# Patient Record
Sex: Male | Born: 1960
Health system: Southern US, Community
[De-identification: ages and names within clinical notes are randomized; demographics above are authoritative.]

## PROBLEM LIST (undated history)

## (undated) DIAGNOSIS — N281 Cyst of kidney, acquired: Secondary | ICD-10-CM

## (undated) DIAGNOSIS — E7800 Pure hypercholesterolemia, unspecified: Secondary | ICD-10-CM

## (undated) DIAGNOSIS — E039 Hypothyroidism, unspecified: Secondary | ICD-10-CM

## (undated) DIAGNOSIS — Z87442 Personal history of urinary calculi: Secondary | ICD-10-CM

## (undated) DIAGNOSIS — I8500 Esophageal varices without bleeding: Secondary | ICD-10-CM

## (undated) DIAGNOSIS — J302 Other seasonal allergic rhinitis: Secondary | ICD-10-CM

## (undated) DIAGNOSIS — N486 Induration penis plastica: Secondary | ICD-10-CM

## (undated) DIAGNOSIS — K5792 Diverticulitis of intestine, part unspecified, without perforation or abscess without bleeding: Secondary | ICD-10-CM

## (undated) DIAGNOSIS — Z8489 Family history of other specified conditions: Secondary | ICD-10-CM

## (undated) DIAGNOSIS — K7460 Unspecified cirrhosis of liver: Secondary | ICD-10-CM

## (undated) DIAGNOSIS — D696 Thrombocytopenia, unspecified: Secondary | ICD-10-CM

## (undated) HISTORY — DX: Diverticulitis of intestine, part unspecified, without perforation or abscess without bleeding: K57.92

## (undated) HISTORY — PX: CHOLECYSTECTOMY: SHX55

## (undated) HISTORY — DX: Personal history of urinary calculi: Z87.442

## (undated) HISTORY — DX: Other seasonal allergic rhinitis: J30.2

## (undated) HISTORY — DX: Induration penis plastica: N48.6

## (undated) HISTORY — DX: Pure hypercholesterolemia, unspecified: E78.00

## (undated) HISTORY — DX: Cyst of kidney, acquired: N28.1

## (undated) HISTORY — DX: Hypothyroidism, unspecified: E03.9

## (undated) HISTORY — DX: Thrombocytopenia, unspecified: D69.6

---

## 1979-04-29 HISTORY — PX: HERNIA REPAIR: SHX51

## 1999-04-29 HISTORY — PX: SHOULDER SURGERY: SHX246

## 2004-04-28 HISTORY — PX: COLONOSCOPY: SHX174

## 2004-11-13 ENCOUNTER — Ambulatory Visit: Payer: Self-pay | Admitting: Unknown Physician Specialty

## 2005-06-28 ENCOUNTER — Emergency Department: Payer: Self-pay | Admitting: Emergency Medicine

## 2005-12-29 ENCOUNTER — Emergency Department: Payer: Self-pay | Admitting: Internal Medicine

## 2006-01-13 ENCOUNTER — Ambulatory Visit: Payer: Self-pay | Admitting: Urology

## 2006-04-15 ENCOUNTER — Ambulatory Visit: Payer: Self-pay | Admitting: Urology

## 2007-04-16 ENCOUNTER — Ambulatory Visit: Payer: Self-pay | Admitting: Urology

## 2008-04-12 ENCOUNTER — Ambulatory Visit: Payer: Self-pay | Admitting: Urology

## 2008-10-02 ENCOUNTER — Ambulatory Visit: Payer: Self-pay | Admitting: Emergency Medicine

## 2009-04-23 ENCOUNTER — Ambulatory Visit: Payer: Self-pay | Admitting: Urology

## 2010-02-06 ENCOUNTER — Ambulatory Visit: Payer: Self-pay | Admitting: Unknown Physician Specialty

## 2010-04-17 ENCOUNTER — Ambulatory Visit: Payer: Self-pay | Admitting: Urology

## 2011-06-24 ENCOUNTER — Emergency Department: Payer: Self-pay | Admitting: *Deleted

## 2017-05-12 ENCOUNTER — Other Ambulatory Visit: Payer: Self-pay

## 2017-06-08 ENCOUNTER — Encounter: Payer: Self-pay | Admitting: Urology

## 2017-06-08 ENCOUNTER — Ambulatory Visit (INDEPENDENT_AMBULATORY_CARE_PROVIDER_SITE_OTHER): Payer: BLUE CROSS/BLUE SHIELD | Admitting: Urology

## 2017-06-08 VITALS — BP 134/80 | HR 76 | Ht 66.0 in | Wt 243.0 lb

## 2017-06-08 DIAGNOSIS — N486 Induration penis plastica: Secondary | ICD-10-CM

## 2017-06-08 NOTE — Progress Notes (Signed)
06/08/2017 2:14 PM   [name]. April 12, 1961 [id number]  Referring provider: Karen Kitchens, MD [place], [place] [address]  Chief Complaint  Patient presents with  . Abnormal Penile Curvature    New Patient    HPI: Ryan Willis is a 57 year old male seen in consultation at the request of Dr. Corinda Gubler for evaluation of Peyronie's disease.  He states approximately 3 years ago he and his wife were having vigorous intercourse and he noted penile pain however had no rapid detumescence or bruising/swelling.  He states the next day his erection was curved approximately close to degrees.  He has no pain with erections.  Over the past 3 years the curvature has increased slightly to greater than 90 degrees.  He notes the curvature at the distal shaft.  He does have some difficulty achieving and maintaining an erection however states he thinks this is more psychological.  He presently is unable to achieve penetration due to the degree of curvature.   PMH: No past medical history on file.  Surgical History: Past Surgical History:  Procedure Laterality Date  . HERNIA REPAIR  1981  . SHOULDER SURGERY  2001    Home Medications:  Allergies as of 06/08/2017   No Known Allergies     Medication List    as of 06/08/2017  2:14 PM   You have not been prescribed any medications.     Allergies: No Known Allergies  Family History: Family History  Problem Relation Age of Onset  . Kidney cancer Mother     Social History:  reports that  has never smoked. he has never used smokeless tobacco. He reports that he drinks alcohol. He reports that he does not use drugs.  ROS: UROLOGY Frequent Urination?: No Hard to postpone urination?: No Burning/pain with urination?: No Get up at night to urinate?: Yes Leakage of urine?: No Urine stream starts and stops?: No Trouble starting stream?: No Do you have to strain to urinate?: No Blood in urine?: No Urinary tract infection?:  No Sexually transmitted disease?: No Injury to kidneys or bladder?: No Painful intercourse?: No Weak stream?: No Erection problems?: Yes Penile pain?: No  Gastrointestinal Nausea?: No Vomiting?: No Indigestion/heartburn?: No Diarrhea?: No Constipation?: No  Constitutional Fever: No Night sweats?: No Weight loss?: No Fatigue?: No  Skin Skin rash/lesions?: No Itching?: No  Eyes Blurred vision?: No Double vision?: No  Ears/Nose/Throat Sore throat?: No Sinus problems?: No  Hematologic/Lymphatic Swollen glands?: No Easy bruising?: No  Cardiovascular Leg swelling?: No Chest pain?: No  Respiratory Cough?: No Shortness of breath?: No  Endocrine Excessive thirst?: No  Musculoskeletal Back pain?: No Joint pain?: No  Neurological Headaches?: No Dizziness?: No  Psychologic Depression?: No Anxiety?: No  Physical Exam: BP 134/80   Pulse 76   Ht 5' 6"  (1.676 m)   Wt 243 lb (110.2 kg)   BMI 39.22 kg/m   Constitutional:  Alert and oriented, No acute distress. HEENT: [place] AT, moist mucus membranes.  Trachea midline, no masses. Cardiovascular: No clubbing, cyanosis, or edema. Respiratory: Normal respiratory effort, no increased work of breathing. GI: Abdomen is soft, nontender, nondistended, no abdominal masses GU: No CVA tenderness.Marland Kitchen  Penis uncircumcised.  There is extensive plaque formation dorsally extending from the base to the distal shaft.  Testes descended bilaterally without masses or tenderness Skin: No rashes, bruises or suspicious lesions. Lymph: No cervical or inguinal adenopathy. Neurologic: Grossly intact, no focal deficits, moving all 4 extremities. Psychiatric: Normal mood and affect.  Assessment & Plan:    1. Peyronie disease He has curvature greater than 90 degrees and technically would not be a candidate for Xiaflex if this is confirmed on artificial erection.  Due to the degree of curvature  Xiaflex may not be as effective.  I briefly  discussed surgical options.  Recommended a second opinion with Dr. Francesca Jewett regarding suitability of Xiaflex versus [facility] surgical management.    Abbie Sons, [place] [address], [place], [place] [address] [phone]

## 2017-06-23 ENCOUNTER — Encounter: Payer: Self-pay | Admitting: *Deleted

## 2017-06-23 ENCOUNTER — Inpatient Hospital Stay: Payer: BLUE CROSS/BLUE SHIELD | Attending: Internal Medicine | Admitting: Internal Medicine

## 2017-06-23 DIAGNOSIS — N486 Induration penis plastica: Secondary | ICD-10-CM | POA: Diagnosis not present

## 2017-06-23 DIAGNOSIS — D696 Thrombocytopenia, unspecified: Secondary | ICD-10-CM | POA: Diagnosis present

## 2017-06-23 DIAGNOSIS — K7460 Unspecified cirrhosis of liver: Secondary | ICD-10-CM

## 2017-06-23 NOTE — Assessment & Plan Note (Addendum)
#  Incidental January 2019 platelets and will between 106-97.  The etiology is unclear.  Discussed the possible etiologies that include liver disease viral infection; less likely any malignancy.  The  #Recommend checking CBC LDH; hepatitis BC HIV panel [[facility]]  Ultrasound of the abdomen to rule out cirrhosis splenomegaly.  # Peyronie's disease-I think this is unrelated to his patient's thrombocytopenia.  Defer to urology.  #Follow-up in approximately 2 weeks to review the above work up.  Thank you Dr.Rabinowitz for allowing me to participate in the care of your pleasant patient. Please do not hesitate to contact me with questions or concerns in the interim.

## 2017-06-23 NOTE — Progress Notes (Signed)
[place] NOTE  Patient Care Team: Karen Kitchens, MD as PCP - General (Family Medicine)  CHIEF COMPLAINTS/PURPOSE OF CONSULTATION: Thrombocytopenia   # Thrombocytopenia  # Peyronie disease-    No history exists.     HISTORY OF PRESENTING ILLNESS:  Ryan Willis. 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  Patient states that he has been recently diagnosed with Peyronie's disease of the penis.  And he is awaiting a second opinion at [facility].  However incidentally was noted to have low platelets initially 106; and then found to have platelets of 97 after a repeat blood work.  He denies any unusual nosebleeds or gum bleeds.  He denies any easy bruising.  Patient denies any liver problems.  Denies any alcohol.  ROS: A complete 10 point review of system is done which is negative except mentioned above in history of present illness  MEDICAL HISTORY:  Past Medical History:  Diagnosis Date  . Diverticulitis   . History of kidney stones   . Hypercholesteremia   . Hypothyroidism   . Peyronie's disease   . Renal cyst, left   . Seasonal allergies   . Thrombocytopenia ([place])     SURGICAL HISTORY: Past Surgical History:  Procedure Laterality Date  . COLONOSCOPY  2006  . HERNIA REPAIR  1981  . SHOULDER SURGERY Right 2001    SOCIAL HISTORY: retd. Engineer, structural; part time information specialist; never smoked; no alcohol; live in [place]. Lives with wife.  Social History   Socioeconomic History  . Marital status: Married    Spouse name: Not on file  . Number of children: Not on file  . Years of education: Not on file  . Highest education level: Not on file  Social Needs  . Financial resource strain: Not on file  . Food insecurity - worry: Not on file  . Food insecurity - inability: Not on file  . Transportation needs - medical: Not on file  . Transportation  needs - non-medical: Not on file  Occupational History  . Not on file  Tobacco Use  . Smoking status: Never Smoker  . Smokeless tobacco: Never Used  Substance and Sexual Activity  . Alcohol use: Yes    Comment: "1 or 2 year drinks a year."  . Drug use: No  . Sexual activity: Not on file  Other Topics Concern  . Not on file  Social History Narrative  . Not on file    FAMILY HISTORY: colon father- 52 year; mother- kidney cancer; died of lung cancer [2018] Family History  Problem Relation Age of Onset  . Kidney cancer Mother   . Colon cancer Father     ALLERGIES:  has No Known Allergies.  MEDICATIONS:  No current outpatient medications on file.   No current facility-administered medications for this visit.       Marland Kitchen  PHYSICAL EXAMINATION: ECOG PERFORMANCE STATUS: 0 - Asymptomatic  Vitals:   06/23/17 1112  BP: (!) 158/93  Pulse: 73  Resp: 16  Temp: 97.9 F (36.6 C)   Filed Weights   06/23/17 1102 06/23/17 1112  Weight: 241 lb 11.8 oz (109.6 kg) 241 lb 11.8 oz (109.6 kg)    GENERAL: Well-nourished well-developed; Alert, no distress and comfortable.   Alone.  EYES: no pallor or icterus OROPHARYNX: no thrush or ulceration; good dentition  NECK: supple, no masses felt LYMPH:  no palpable lymphadenopathy  in the cervical, axillary or inguinal regions LUNGS: clear to auscultation and  No wheeze or crackles HEART/CVS: regular rate & rhythm and no murmurs; No lower extremity edema ABDOMEN: abdomen soft, non-tender and normal bowel sounds Musculoskeletal:no cyanosis of digits and no clubbing  PSYCH: alert & oriented x 3 with fluent speech NEURO: no focal motor/sensory deficits SKIN:  no rashes or significant lesions  LABORATORY DATA:  I have reviewed the data as listed No results found for: WBC, HGB, HCT, MCV, PLT No results for input(s): NA, K, CL, CO2, GLUCOSE, BUN, CREATININE, CALCIUM, GFRNONAA, GFRAA, PROT, ALBUMIN, AST, ALT, ALKPHOS, BILITOT, BILIDIR, IBILI in  the last 8760 hours.  RADIOGRAPHIC STUDIES: I have personally reviewed the radiological images as listed and agreed with the findings in the report. No results found.  ASSESSMENT & PLAN:   Thrombocytopenia [facility]) #Incidental January 2019 platelets and will between 106-97.  The etiology is unclear.  Discussed the possible etiologies that include liver disease viral infection; less likely any malignancy.  The  #Recommend checking CBC LDH; hepatitis BC HIV panel [[facility]]  Ultrasound of the abdomen to rule out cirrhosis splenomegaly.  # Peyronie's disease-I think this is unrelated to his patient's thrombocytopenia.  Defer to urology.  #Follow-up in approximately 2 weeks to review the above work up.  Thank you Dr.Rabinowitz for allowing me to participate in the care of your pleasant patient. Please do not hesitate to contact me with questions or concerns in the interim.   # 45 minutes face-to-face with the patient discussing the above plan of care; more than 50% of time spent on prognosis/ natural history; counseling and coordination.  All questions were answered. The patient knows to call the clinic with any problems, questions or concerns.    Cammie Sickle, MD 06/24/2017 2:36 PM

## 2017-06-26 ENCOUNTER — Ambulatory Visit
Admission: RE | Admit: 2017-06-26 | Discharge: 2017-06-26 | Disposition: A | Discharge status: Home / Self Care | Payer: BLUE CROSS/BLUE SHIELD | Source: Ambulatory Visit | Attending: Internal Medicine | Admitting: Internal Medicine

## 2017-06-26 DIAGNOSIS — K8020 Calculus of gallbladder without cholecystitis without obstruction: Secondary | ICD-10-CM | POA: Diagnosis not present

## 2017-06-26 DIAGNOSIS — R161 Splenomegaly, not elsewhere classified: Secondary | ICD-10-CM | POA: Insufficient documentation

## 2017-06-26 DIAGNOSIS — K7460 Unspecified cirrhosis of liver: Secondary | ICD-10-CM | POA: Insufficient documentation

## 2017-06-26 DIAGNOSIS — D696 Thrombocytopenia, unspecified: Secondary | ICD-10-CM | POA: Diagnosis present

## 2017-07-07 ENCOUNTER — Inpatient Hospital Stay: Payer: BLUE CROSS/BLUE SHIELD | Attending: Internal Medicine | Admitting: Internal Medicine

## 2017-07-07 ENCOUNTER — Encounter: Payer: Self-pay | Admitting: Internal Medicine

## 2017-07-07 ENCOUNTER — Other Ambulatory Visit: Payer: Self-pay

## 2017-07-07 VITALS — BP 153/97 | HR 106 | Temp 97.9°F | Resp 20 | Ht 66.0 in | Wt 240.3 lb

## 2017-07-07 DIAGNOSIS — D696 Thrombocytopenia, unspecified: Secondary | ICD-10-CM | POA: Diagnosis not present

## 2017-07-07 DIAGNOSIS — R161 Splenomegaly, not elsewhere classified: Secondary | ICD-10-CM | POA: Diagnosis not present

## 2017-07-07 DIAGNOSIS — N486 Induration penis plastica: Secondary | ICD-10-CM | POA: Diagnosis not present

## 2017-07-07 NOTE — Progress Notes (Signed)
[place] NOTE  Patient Care Team: Karen Kitchens, MD as PCP - General (Family Medicine)  CHIEF COMPLAINTS/PURPOSE OF CONSULTATION: Thrombocytopenia  # THROMBOCYTOPENIA [97-108; incidental]; Hep B/hepC/HIV-NEG [[facility]]  # SPLENOMEGALY; no cirrhosis [US- 1000cc; march 2019]  # Peyronie disease-    No history exists.     HISTORY OF PRESENTING ILLNESS:  Ryan Willis. 57 y.o.  male with a new diagnosis of incidental thrombocytopenia is here for follow-up/review the labs ultrasound of the abdomen.  Patient continues to deny any nosebleeds or gum bleeds.  Denies any easy bruising.  Continues to deny any liver problems denies any alcohol abuse.  Patient states that he has been recently diagnosed with Peyronie's disease of the penis.  And he is awaiting a second opinion at [facility]: A complete 10 point review of system is done which is negative except mentioned above in history of present illness  MEDICAL HISTORY:  Past Medical History:  Diagnosis Date  . Diverticulitis   . History of kidney stones   . Hypercholesteremia   . Hypothyroidism   . Peyronie's disease   . Renal cyst, left   . Seasonal allergies   . Thrombocytopenia ([place])     SURGICAL HISTORY: Past Surgical History:  Procedure Laterality Date  . COLONOSCOPY  2006  . HERNIA REPAIR  1981  . SHOULDER SURGERY Right 2001    SOCIAL HISTORY: retd. 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 Lives with wife.  Social History   Socioeconomic History  . Marital status: Married    Spouse name: Not on file  . Number of children: Not on file  . Years of education: Not on file  . Highest education level: Not on file  Social Needs  . Financial resource strain: Not on file  . Food insecurity - worry: Not on file  . Food insecurity - inability: Not on file  . Transportation needs - medical: Not on file  . Transportation needs - non-medical:  Not on file  Occupational History  . Not on file  Tobacco Use  . Smoking status: Never Smoker  . Smokeless tobacco: Never Used  Substance and Sexual Activity  . Alcohol use: Yes    Comment: "1 or 2 year drinks a year."  . Drug use: No  . Sexual activity: Not on file  Other Topics Concern  . Not on file  Social History Narrative  . Not on file    FAMILY HISTORY: colon father- 2 year; mother- kidney cancer; died of lung cancer [2018] Family History  Problem Relation Age of Onset  . Kidney cancer Mother   . Colon cancer Father     ALLERGIES:  has No Known Allergies.  MEDICATIONS:  No current outpatient medications on file.   No current facility-administered medications for this visit.       Marland Kitchen  PHYSICAL EXAMINATION: ECOG PERFORMANCE STATUS: 0 - Asymptomatic  Vitals:   07/07/17 1003  BP: (!) 153/97  Pulse: (!) 106  Resp: 20  Temp: 97.9 F (36.6 C)   Filed Weights   07/07/17 1003  Weight: 240 lb 4.8 oz (109 kg)    GENERAL: Well-nourished well-developed; Alert, no distress and comfortable.   Alone.  EYES: no pallor or icterus OROPHARYNX: no thrush or ulceration; good dentition  NECK: supple, no masses felt LYMPH:  no palpable lymphadenopathy in the cervical, axillary or inguinal regions LUNGS: clear to auscultation and  No wheeze or crackles  HEART/CVS: regular rate & rhythm and no murmurs; No lower extremity edema ABDOMEN: abdomen soft, non-tender and normal bowel sounds; spleen 4-5 fingers breadth below the costal margin Musculoskeletal:no cyanosis of digits and no clubbing  PSYCH: alert & oriented x 3 with fluent speech NEURO: no focal motor/sensory deficits SKIN:  no rashes or significant lesions  LABORATORY DATA:  I have reviewed the data as listed No results found for: WBC, HGB, HCT, MCV, PLT No results for input(s): NA, K, CL, CO2, GLUCOSE, BUN, CREATININE, CALCIUM, GFRNONAA, GFRAA, PROT, ALBUMIN, AST, ALT, ALKPHOS, BILITOT, BILIDIR, IBILI in the  last 8760 hours.  RADIOGRAPHIC STUDIES: I have personally reviewed the radiological images as listed and agreed with the findings in the report. US Abdomen Complete  Result Date: 06/26/2017 CLINICAL DATA:  Thrombocytopenia, cirrhosis EXAM: COMPLETE ABDOMINAL ULTRASOUND COMPARISON:  04/12/2008 FINDINGS: Gallbladder: Incompletely distended, wall measuring up to 4.3 mm thick. At least one 7 mm shadowing calculus. Sonographer describes no sonographic Murphy's sign. There is no pericholecystic fluid. Common bile duct:  Normal in caliber, 4.9m diameter. Liver: No focal lesion or intrahepatic biliary ductal dilatation. Antegrade portal vein flow noted on color Doppler. IVC: Visualized segments unremarkable, portions obscured by overlying bowel gas. Pancreas: Visualized segments unremarkable, portions obscured by overlying bowel gas. Spleen: Enlarged, 19 x 8 x 17 cm (volume = 1000 cm^3) Right Kidney:  No mass or hydronephrosis, 12cm in length. Left Kidney: No hydronephrosis. 6.1 x 5.7 x 5.5 cm simple appearing cyst from the lower pole. No solid renal lesion. 11.4cm in length. Abdominal aorta: Visualized segments unremarkable, portions obscured by overlying bowel gas. IMPRESSION: 1. Unremarkable liver. 2. Splenomegaly 3. Cholelithiasis with mild gallbladder wall thickening which is nonspecific. Electronically Signed   By: DLucrezia EuropeM.D.   On: 06/26/2017 09:11   FINDINGS: Gallbladder: Incompletely distended, wall measuring up to 4.3 mm thick. At least one 7 mm shadowing calculus. Sonographer describes no sonographic Murphy's sign. There is no pericholecystic fluid.  Common bile duct:  Normal in caliber, 4.35mdiameter.  Liver: No focal lesion or intrahepatic biliary ductal dilatation. Antegrade portal vein flow noted on color Doppler.  IVC: Visualized segments unremarkable, portions obscured by overlying bowel gas.  Pancreas: Visualized segments unremarkable, portions obscured by overlying bowel  gas.  Spleen: Enlarged, 19 x 8 x 17 cm (volume = 1000 cm^3)  Right Kidney:  No mass or hydronephrosis, 12cm in length.  Left Kidney: No hydronephrosis. 6.1 x 5.7 x 5.5 cm simple appearing cyst from the lower pole. No solid renal lesion. 11.4cm in length.  Abdominal aorta: Visualized segments unremarkable, portions obscured by overlying bowel gas.  IMPRESSION: 1. Unremarkable liver. 2. Splenomegaly 3. Cholelithiasis with mild gallbladder wall thickening which is nonspecific.   Electronically Signed   By: D Lucrezia Europe.D.   On: 06/26/2017 09:11  ASSESSMENT & PLAN:   [facility] Incidental January 2019 platelets and will between 106-97; with splenomegaly.  Infectious workup including-HIV hepatitis B and C is negative.  Interestingly patient has then splenomegaly [see discussion below]  #Splenomegaly [1000 cc]-unclear etiology.  However this is the likely cause of patient's thrombocytopenia.  No obvious evidence of cirrhosis.  Discussed possibility of lymphoma/ involving the spleen is quite possible.  Recommend a CT scan of the abdomen pelvis for further evaluation.  Also discussed regarding bone marrow biopsy.  However as patient is asymptomatic at this time otherwise-if the CT scan does not show any lymphadenopathy or any suspicion for lymphoma I think it is reasonable to  monitor the patient closely-and initiate further workup if patient gets symptomatic/or platelets drop.  Patient agrees with the plan.  # Peyronie's disease-I think this is unrelated to his patient's thrombocytopenia.   # CT A/P- ASAP; follow up in 2 months' labs - [facility]. Will call with results.   All questions were answered. The patient knows to call the clinic with any problems, questions or concerns.    Cammie Sickle, MD 07/08/2017 7:20 AM

## 2017-07-07 NOTE — Assessment & Plan Note (Addendum)
#  Incidental January 2019 platelets and will between 106-97; with splenomegaly.  Infectious workup including-HIV hepatitis B and C is negative.  Interestingly patient has then splenomegaly [see discussion below]  #Splenomegaly [1000 cc]-unclear etiology.  However this is the likely cause of patient's thrombocytopenia.  No obvious evidence of cirrhosis.  Discussed possibility of lymphoma/ involving the spleen is quite possible.  Recommend a CT scan of the abdomen pelvis for further evaluation.  Also discussed regarding bone marrow biopsy.  However as patient is asymptomatic at this time otherwise-if the CT scan does not show any lymphadenopathy or any suspicion for lymphoma I think it is reasonable to monitor the patient closely-and initiate further workup if patient gets symptomatic/or platelets drop.  Patient agrees with the plan.  # Peyronie's disease-I think this is unrelated to his patient's thrombocytopenia.   # CT A/P- ASAP; follow up in 2 months' labs - [facility]. Will call with results.

## 2017-07-09 ENCOUNTER — Ambulatory Visit
Admission: RE | Admit: 2017-07-09 | Payer: BLUE CROSS/BLUE SHIELD | Source: Ambulatory Visit | Admitting: Internal Medicine

## 2017-07-13 ENCOUNTER — Other Ambulatory Visit: Payer: Self-pay | Admitting: *Deleted

## 2017-07-13 DIAGNOSIS — D696 Thrombocytopenia, unspecified: Secondary | ICD-10-CM

## 2017-07-13 DIAGNOSIS — R161 Splenomegaly, not elsewhere classified: Secondary | ICD-10-CM

## 2017-07-14 ENCOUNTER — Other Ambulatory Visit: Payer: Self-pay | Admitting: Internal Medicine

## 2017-07-15 ENCOUNTER — Ambulatory Visit
Admission: RE | Admit: 2017-07-15 | Discharge: 2017-07-15 | Disposition: A | Discharge status: Home / Self Care | Payer: BLUE CROSS/BLUE SHIELD | Source: Ambulatory Visit | Attending: Internal Medicine | Admitting: Internal Medicine

## 2017-07-15 DIAGNOSIS — R161 Splenomegaly, not elsewhere classified: Secondary | ICD-10-CM | POA: Diagnosis not present

## 2017-07-15 DIAGNOSIS — K449 Diaphragmatic hernia without obstruction or gangrene: Secondary | ICD-10-CM | POA: Insufficient documentation

## 2017-07-15 DIAGNOSIS — I8500 Esophageal varices without bleeding: Secondary | ICD-10-CM | POA: Insufficient documentation

## 2017-07-15 DIAGNOSIS — K8020 Calculus of gallbladder without cholecystitis without obstruction: Secondary | ICD-10-CM | POA: Diagnosis not present

## 2017-07-15 DIAGNOSIS — D696 Thrombocytopenia, unspecified: Secondary | ICD-10-CM | POA: Diagnosis present

## 2017-07-15 DIAGNOSIS — N200 Calculus of kidney: Secondary | ICD-10-CM | POA: Insufficient documentation

## 2017-07-15 DIAGNOSIS — K5730 Diverticulosis of large intestine without perforation or abscess without bleeding: Secondary | ICD-10-CM | POA: Diagnosis not present

## 2017-07-15 MED ORDER — IOPAMIDOL (ISOVUE-300) INJECTION 61%
100.0000 mL | Freq: Once | INTRAVENOUS | Status: AC | PRN
  Administered 2017-07-15: 100 mL via INTRAVENOUS

## 2017-07-20 ENCOUNTER — Telehealth: Payer: Self-pay | Admitting: *Deleted

## 2017-07-20 DIAGNOSIS — K7460 Unspecified cirrhosis of liver: Secondary | ICD-10-CM

## 2017-07-20 NOTE — Telephone Encounter (Signed)
Spoke with patient. Patient informed of test results. He is agreeable for GI consult.

## 2017-08-12 ENCOUNTER — Other Ambulatory Visit: Payer: Self-pay | Admitting: Internal Medicine

## 2017-08-18 ENCOUNTER — Ambulatory Visit: Payer: BLUE CROSS/BLUE SHIELD | Admitting: Gastroenterology

## 2017-08-18 ENCOUNTER — Encounter: Payer: Self-pay | Admitting: Gastroenterology

## 2017-08-18 ENCOUNTER — Encounter (INDEPENDENT_AMBULATORY_CARE_PROVIDER_SITE_OTHER): Payer: Self-pay

## 2017-08-18 VITALS — BP 120/83 | HR 88 | Ht 66.0 in | Wt 240.2 lb

## 2017-08-18 DIAGNOSIS — K7460 Unspecified cirrhosis of liver: Secondary | ICD-10-CM | POA: Diagnosis not present

## 2017-08-18 DIAGNOSIS — Z800 Family history of malignant neoplasm of digestive organs: Secondary | ICD-10-CM | POA: Diagnosis not present

## 2017-08-18 DIAGNOSIS — Z114 Encounter for screening for human immunodeficiency virus [HIV]: Secondary | ICD-10-CM | POA: Diagnosis not present

## 2017-08-18 NOTE — Patient Instructions (Signed)
1. Complete lab orders.  2. Pick-up prep for colonoscopy.

## 2017-08-18 NOTE — Addendum Note (Signed)
Addended by: Peggye Ley on: 08/18/2017 02:32 PM   Modules accepted: Orders

## 2017-08-18 NOTE — Progress Notes (Signed)
Ryan Bellows MD, MRCP(U.K) [address]  [place]  [place], [place] [address]  Main: [phone]  Fax: [phone]   Gastroenterology Consultation  Referring Provider:     Cammie Willis, * Primary Care Physician:  Ryan Kitchens, MD Primary Gastroenterologist:  Dr. Jonathon Willis  Reason for Consultation:     Liver cirrhosis         HPI:   Ryan Willis. is a 57 y.o. y/o male referred for consultation & management  by Dr. Corinda Willis, Ryan Molder, MD.    He has been referred for liver cirrhosis. He was seen recently by Dr Ryan Willis for thrombocytopenia .   RUQ USG in 06/2017 showed a normal liver, splenomegaly and Cholelithiasis with mild gallbladder wall thickening.    The patient had a CT scan on 07/15/17 which showed features of cirrhosis and portal hypertension with with splenomegaly, a recanalized left paraumbilical vein and small distal esophageal varices.   I am unable to view the labs in 06/2017 . Said the liver cirrhosis is a new diagnosis . No issues with the liver the past or family.   His dad had colon cancer that went into the liver. He last colonoscopy was atleast 10 years back .    No military service, no tatoos, no incarceration . 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 No illegal drugs in the past .   He is not diabetic, he is not hypertensive. This is the heaviest he has every weighed. Obesity runs in the family. Does wake up in the morning with a headache, hesnores . Feels sleepy during the day .     Past Medical History:  Diagnosis Date  . Diverticulitis   . History of kidney stones   . Hypercholesteremia   . Hypothyroidism   . Peyronie's disease   . Renal cyst, left   . Seasonal allergies   . Thrombocytopenia ([place])     Past Surgical History:  Procedure Laterality Date  . COLONOSCOPY  2006  . HERNIA REPAIR  1981  . SHOULDER SURGERY Right 2001    Prior to Admission medications   Not on File    Family  History  Problem Relation Age of Onset  . Kidney cancer Mother   . Colon cancer Father      Social History   Tobacco Use  . Smoking status: Never Smoker  . Smokeless tobacco: Never Used  Substance Use Topics  . Alcohol use: Not Currently    Comment: "1 or 2 year drinks a year."  . Drug use: No    Allergies as of 08/18/2017  . (No Known Allergies)    Review of Systems:    All systems reviewed and negative except where noted in HPI.   Physical Exam:  BP 120/83   Pulse 88   Ht 5\' 6"  (1.676 m)   Wt 240 lb 3.2 oz (109 kg)   BMI 38.77 kg/m  No LMP for male patient. Psych:  Alert and cooperative. Normal mood and affect. General:   Alert,  Well-developed, well-nourished, pleasant and cooperative in NAD Head:  Normocephalic and atraumatic. Eyes:  Sclera clear, no icterus.   Conjunctiva pink. Ears:  Normal auditory acuity. Nose:  No deformity, discharge, or lesions. Mouth:  No deformity or lesions,oropharynx pink & moist. Neck:  Supple; no masses or thyromegaly. Lungs:  Respirations even and unlabored.  Clear throughout to auscultation.   No wheezes, crackles, or rhonchi.  No acute distress. Heart:  Regular rate and rhythm; no murmurs, clicks, rubs, or gallops. Abdomen:  Normal bowel sounds.  No bruits.  Soft, non-tender and non-distended without masses, hepatosplenomegaly, central midline epigastric hernia reducible.  No guarding or rebound tenderness.    Neurologic:  Alert and oriented x3;  grossly normal neurologically. Skin:  Intact without significant lesions or rashes. No jaundice.Spider angiomas over chest and back  Lymph Nodes:  No significant cervical adenopathy. Psych:  Alert and cooperative. Normal mood and affect.  Imaging Studies: No results found.  Assessment and Plan:   Ryan Willis. is a 57 y.o. y/o male has been referred for liver cirrhosis. The CT scan shows features of portal hypertension with recannalization of the umbilical vein , esophageal varices and  splenomegaly. I suspect most likely that the thrombocytopenia is related to portal hypertension .   Plan  1. Further labs necessary to look for viral hepatitis, autoimmune liver disease, Primary Biliary cirrhosis, celiac disease, muscle disorders,Primary Sclerosing Cholangitis, Hemachromatosis, Wilson's disease, or A-1 antitrypsin deficiency.This will help determine the etiology .   2. EGD to screen for esophageal varcies and grade   3. Abdominal ultrasound to screen for [place]:  due  12/2017   4. Check INR, labs for MELD and childs PUGH score.   5. Colonoscopy due to family history of colon cancer  6. Patient information on NAFLD    I have discussed alternative options, risks & benefits,  which include, but are not limited to, bleeding, infection, perforation,respiratory complication & drug reaction.  The patient agrees with this plan & written consent will be obtained.    Follow up in 3 months   Dr Ryan Bellows MD,MRCP(U.K)

## 2017-08-19 MED ORDER — PEG 3350-KCL-NA BICARB-NACL 420 G PO SOLR: 4000.0000 mL | Freq: Once | ORAL | 0 refills | Status: AC

## 2017-08-19 NOTE — Addendum Note (Signed)
Addended by: Peggye Ley on: 08/19/2017 10:13 AM   Modules accepted: Orders, SmartSet

## 2017-08-19 NOTE — Addendum Note (Signed)
Addended by: Peggye Ley on: 08/19/2017 10:12 AM   Modules accepted: Orders

## 2017-08-25 LAB — PROTIME-INR
INR: 1.2 (ref 0.8–1.2)
Prothrombin Time: 12.1 s — ABNORMAL HIGH (ref 9.1–12.0)

## 2017-08-25 LAB — COMPREHENSIVE METABOLIC PANEL
A/G RATIO: 1.5 (ref 1.2–2.2)
ALT: 36 IU/L (ref 0–44)
AST: 49 IU/L — ABNORMAL HIGH (ref 0–40)
Albumin: 4.2 g/dL (ref 3.5–5.5)
Alkaline Phosphatase: 99 IU/L (ref 39–117)
BUN/Creatinine Ratio: 19 (ref 9–20)
BUN: 16 mg/dL (ref 6–24)
Bilirubin Total: 0.7 mg/dL (ref 0.0–1.2)
CALCIUM: 9.2 mg/dL (ref 8.7–10.2)
CO2: 22 mmol/L (ref 20–29)
CREATININE: 0.86 mg/dL (ref 0.76–1.27)
Chloride: 107 mmol/L — ABNORMAL HIGH (ref 96–106)
GFR calc non Af Amer: 96 mL/min/{1.73_m2} (ref >59)
GFR, EST AFRICAN AMERICAN: 111 mL/min/{1.73_m2} (ref >59)
Globulin, Total: 2.8 g/dL (ref 1.5–4.5)
Glucose: 85 mg/dL (ref 65–99)
Potassium: 4.5 mmol/L (ref 3.5–5.2)
Sodium: 141 mmol/L (ref 134–144)
TOTAL PROTEIN: 7 g/dL (ref 6.0–8.5)

## 2017-08-25 LAB — HEPATITIS B SURFACE ANTIBODY,QUALITATIVE: Hep B Surface Ab, Qual: REACTIVE

## 2017-08-25 LAB — ANA: Anti Nuclear Antibody(ANA): NEGATIVE

## 2017-08-26 ENCOUNTER — Other Ambulatory Visit: Payer: Self-pay

## 2017-08-26 DIAGNOSIS — Z800 Family history of malignant neoplasm of digestive organs: Secondary | ICD-10-CM

## 2017-08-26 DIAGNOSIS — K7460 Unspecified cirrhosis of liver: Secondary | ICD-10-CM

## 2017-08-26 DIAGNOSIS — Z1211 Encounter for screening for malignant neoplasm of colon: Secondary | ICD-10-CM

## 2017-08-30 LAB — GLIA (IGA/G) + TTG IGA
Antigliadin Abs, IgA: 6 units (ref 0–19)
GLIADIN IGG: 6 U (ref 0–19)

## 2017-08-30 LAB — ANTI-SMOOTH MUSCLE ANTIBODY, IGG: SMOOTH MUSCLE AB: 14 U (ref 0–19)

## 2017-08-30 LAB — CBC WITH DIFFERENTIAL/PLATELET
BASOS: 1 %
Basophils Absolute: 0 10*3/uL (ref 0.0–0.2)
EOS (ABSOLUTE): 0.1 10*3/uL (ref 0.0–0.4)
Eos: 3 %
Hematocrit: 42.6 % (ref 37.5–51.0)
Hemoglobin: 15.2 g/dL (ref 13.0–17.7)
IMMATURE GRANS (ABS): 0 10*3/uL (ref 0.0–0.1)
IMMATURE GRANULOCYTES: 0 %
LYMPHS: 29 %
Lymphocytes Absolute: 1.2 10*3/uL (ref 0.7–3.1)
MCH: 35.6 pg — ABNORMAL HIGH (ref 26.6–33.0)
MCHC: 35.7 g/dL (ref 31.5–35.7)
MCV: 100 fL — AB (ref 79–97)
Monocytes Absolute: 0.4 10*3/uL (ref 0.1–0.9)
Monocytes: 10 %
NEUTROS ABS: 2.3 10*3/uL (ref 1.4–7.0)
Neutrophils: 57 %
Platelets: 95 10*3/uL — CL (ref 150–379)
RBC: 4.27 x10E6/uL (ref 4.14–5.80)
RDW: 13 % (ref 12.3–15.4)
WBC: 4.1 10*3/uL (ref 3.4–10.8)

## 2017-08-30 LAB — IRON AND TIBC
Iron Saturation: 19 % (ref 15–55)
Iron: 70 ug/dL (ref 38–169)
TIBC: 366 ug/dL (ref 250–450)
UIBC: 296 ug/dL (ref 111–343)

## 2017-08-30 LAB — ANTI-MICROSOMAL ANTIBODY LIVER / KIDNEY: LKM1 AB: 2.5 U (ref 0.0–20.0)

## 2017-08-30 LAB — ALPHA-1-ANTITRYPSIN: A1 ANTITRYPSIN: 101 mg/dL (ref 90–200)

## 2017-08-30 LAB — IMMUNOGLOBULINS A/E/G/M, SERUM
IgA/Immunoglobulin A, Serum: 256 mg/dL (ref 90–386)
IgE (Immunoglobulin E), Serum: 11 IU/mL (ref 6–495)
IgG (Immunoglobin G), Serum: 1289 mg/dL (ref 700–1600)
IgM (Immunoglobulin M), Srm: 160 mg/dL (ref 20–172)

## 2017-08-30 LAB — HEPATITIS A ANTIBODY, TOTAL: Hep A Total Ab: POSITIVE — AB

## 2017-08-30 LAB — HEPATITIS B E ANTIBODY: Hep B E Ab: NEGATIVE

## 2017-08-30 LAB — HIV ANTIBODY (ROUTINE TESTING W REFLEX): HIV SCREEN 4TH GENERATION: NONREACTIVE

## 2017-08-30 LAB — CERULOPLASMIN: Ceruloplasmin: 15.2 mg/dL — ABNORMAL LOW (ref 16.0–31.0)

## 2017-08-30 LAB — HEPATITIS C ANTIBODY

## 2017-08-30 LAB — HEPATITIS B CORE ANTIBODY, TOTAL: Hep B Core Total Ab: NEGATIVE

## 2017-08-30 LAB — HEPATITIS B SURFACE ANTIGEN: HEP B S AG: NEGATIVE

## 2017-08-30 LAB — FERRITIN: Ferritin: 188 ng/mL (ref 30–400)

## 2017-09-01 ENCOUNTER — Telehealth: Payer: Self-pay

## 2017-09-01 ENCOUNTER — Ambulatory Visit: Payer: BLUE CROSS/BLUE SHIELD | Admitting: Internal Medicine

## 2017-09-01 DIAGNOSIS — K7460 Unspecified cirrhosis of liver: Secondary | ICD-10-CM

## 2017-09-01 NOTE — Telephone Encounter (Signed)
Advised [name] of results per Dr. Vicente Males.  Ceruloplasmin levels are low.   1. Needs slit lamp exam performed by ophthalmologist to r/o " KF ring "   2. 24 hour urinary copper excretion test   [name] states he will contact the ophthamolologist concerning the testing for KF ring along with regular eye visit.   He will pick-up the lab order for copper test. Having labs completed at the same time as Dr. Aletha Halim.

## 2017-09-08 ENCOUNTER — Encounter: Payer: Self-pay | Admitting: *Deleted

## 2017-09-08 ENCOUNTER — Ambulatory Visit: Payer: BLUE CROSS/BLUE SHIELD | Admitting: Certified Registered"

## 2017-09-08 ENCOUNTER — Ambulatory Visit: Payer: BLUE CROSS/BLUE SHIELD | Admitting: Internal Medicine

## 2017-09-08 ENCOUNTER — Ambulatory Visit
Admission: RE | Admit: 2017-09-08 | Discharge: 2017-09-08 | Disposition: A | Discharge status: Home / Self Care | Payer: BLUE CROSS/BLUE SHIELD | Source: Ambulatory Visit | Attending: Gastroenterology | Admitting: Gastroenterology

## 2017-09-08 ENCOUNTER — Encounter
Admission: RE | Disposition: A | Discharge status: Home / Self Care | Payer: Self-pay | Source: Ambulatory Visit | Attending: Gastroenterology

## 2017-09-08 DIAGNOSIS — E039 Hypothyroidism, unspecified: Secondary | ICD-10-CM | POA: Insufficient documentation

## 2017-09-08 DIAGNOSIS — Z800 Family history of malignant neoplasm of digestive organs: Secondary | ICD-10-CM | POA: Insufficient documentation

## 2017-09-08 DIAGNOSIS — Z79899 Other long term (current) drug therapy: Secondary | ICD-10-CM | POA: Diagnosis not present

## 2017-09-08 DIAGNOSIS — Z87442 Personal history of urinary calculi: Secondary | ICD-10-CM | POA: Diagnosis not present

## 2017-09-08 DIAGNOSIS — E7800 Pure hypercholesterolemia, unspecified: Secondary | ICD-10-CM | POA: Insufficient documentation

## 2017-09-08 DIAGNOSIS — K2970 Gastritis, unspecified, without bleeding: Secondary | ICD-10-CM | POA: Insufficient documentation

## 2017-09-08 DIAGNOSIS — K319 Disease of stomach and duodenum, unspecified: Secondary | ICD-10-CM | POA: Diagnosis not present

## 2017-09-08 DIAGNOSIS — K7460 Unspecified cirrhosis of liver: Secondary | ICD-10-CM | POA: Diagnosis not present

## 2017-09-08 DIAGNOSIS — Z1211 Encounter for screening for malignant neoplasm of colon: Secondary | ICD-10-CM | POA: Diagnosis not present

## 2017-09-08 DIAGNOSIS — N486 Induration penis plastica: Secondary | ICD-10-CM | POA: Insufficient documentation

## 2017-09-08 HISTORY — PX: COLONOSCOPY WITH PROPOFOL: SHX5780

## 2017-09-08 HISTORY — PX: ESOPHAGOGASTRODUODENOSCOPY (EGD) WITH PROPOFOL: SHX5813

## 2017-09-08 SURGERY — ESOPHAGOGASTRODUODENOSCOPY (EGD) WITH PROPOFOL
Anesthesia: General

## 2017-09-08 MED ORDER — SODIUM CHLORIDE 0.9 % IV SOLN
INTRAVENOUS | Status: DC
  Administered 2017-09-08: 1000 mL via INTRAVENOUS

## 2017-09-08 MED ORDER — PROPOFOL 500 MG/50ML IV EMUL
INTRAVENOUS | Status: AC
  Filled 2017-09-08: qty 50

## 2017-09-08 MED ORDER — MIDAZOLAM HCL 2 MG/2ML IJ SOLN
INTRAMUSCULAR | Status: AC
  Filled 2017-09-08: qty 2

## 2017-09-08 MED ORDER — PROPOFOL 500 MG/50ML IV EMUL
INTRAVENOUS | Status: DC | PRN
  Administered 2017-09-08: 130 ug/kg/min via INTRAVENOUS

## 2017-09-08 MED ORDER — LIDOCAINE HCL (CARDIAC) PF 100 MG/5ML IV SOSY
PREFILLED_SYRINGE | INTRAVENOUS | Status: DC | PRN
  Administered 2017-09-08: 100 mg via INTRAVENOUS

## 2017-09-08 MED ORDER — LIDOCAINE HCL (PF) 2 % IJ SOLN
INTRAMUSCULAR | Status: AC
  Filled 2017-09-08: qty 10

## 2017-09-08 MED ORDER — MIDAZOLAM HCL 2 MG/2ML IJ SOLN
INTRAMUSCULAR | Status: DC | PRN
  Administered 2017-09-08: 2 mg via INTRAVENOUS

## 2017-09-08 MED ORDER — PROPOFOL 10 MG/ML IV BOLUS
INTRAVENOUS | Status: DC | PRN
  Administered 2017-09-08: 10 mg via INTRAVENOUS
  Administered 2017-09-08: 90 mg via INTRAVENOUS

## 2017-09-08 NOTE — Transfer of Care (Signed)
Immediate Anesthesia Transfer of Care Note  Patient: Ryan Willis.  Procedure(s) Performed: ESOPHAGOGASTRODUODENOSCOPY (EGD) WITH PROPOFOL (N/A ) COLONOSCOPY WITH PROPOFOL (N/A )  Patient Location: PACU  Anesthesia Type:General  Level of Consciousness: sedated  Airway & Oxygen Therapy: Patient Spontanous Breathing and Patient connected to nasal cannula oxygen  Post-op Assessment: Report given to RN and Post -op Vital signs reviewed and stable  Post vital signs: Reviewed and stable  Last Vitals:  Vitals Value Taken Time  BP 100/51 09/08/2017 10:58 AM  Temp    Pulse 77 09/08/2017 10:58 AM  Resp 14 09/08/2017 10:58 AM  SpO2 94 % 09/08/2017 10:58 AM    Last Pain:  Vitals:   09/08/17 1013  TempSrc: Tympanic  PainSc: 0-No pain         Complications: No apparent anesthesia complications

## 2017-09-08 NOTE — H&P (Addendum)
[name], MD [address] [place], [place], [place], [id number], [place], [place], [place], [address] Phone: [phone]  Fax: [phone]  Primary Care Physician:  Karen Kitchens, MD   Pre-Procedure History & Physical: HPI:  Wes Lezotte. is a 57 y.o. male is here for an endoscopy and colonoscopy    Past Medical History:  Diagnosis Date  . Diverticulitis   . History of kidney stones   . Hypercholesteremia   . Hypothyroidism   . Peyronie's disease   . Renal cyst, left   . Seasonal allergies   . Thrombocytopenia ([place])     Past Surgical History:  Procedure Laterality Date  . COLONOSCOPY  2006  . HERNIA REPAIR  1981  . SHOULDER SURGERY Right 2001    Prior to Admission medications   Not on File    Allergies as of 08/26/2017  . (No Known Allergies)    Family History  Problem Relation Age of Onset  . Kidney cancer Mother   . Colon cancer Father     Social History   Socioeconomic History  . Marital status: Married    Spouse name: Not on file  . Number of children: Not on file  . Years of education: Not on file  . Highest education level: Not on file  Occupational History  . Not on file  Social Needs  . Financial resource strain: Not on file  . Food insecurity:    Worry: Not on file    Inability: Not on file  . Transportation needs:    Medical: Not on file    Non-medical: Not on file  Tobacco Use  . Smoking status: Never Smoker  . Smokeless tobacco: Never Used  Substance and Sexual Activity  . Alcohol use: Not Currently    Comment: "1 or 2 year drinks a year."  . Drug use: No  . Sexual activity: Not on file  Lifestyle  . Physical activity:    Days per week: Not on file    Minutes per session: Not on file  . Stress: Not on file  Relationships  . Social connections:    Talks on phone: Not on file    Gets together: Not on file    Attends religious service: Not on file    Active member of club or organization: Not  on file    Attends meetings of clubs or organizations: Not on file    Relationship status: Not on file  . Intimate partner violence:    Fear of current or ex partner: Not on file    Emotionally abused: Not on file    Physically abused: Not on file    Forced sexual activity: Not on file  Other Topics Concern  . Not on file  Social History Narrative  . Not on file    Review of Systems: See HPI, otherwise negative ROS  Physical Exam: BP 124/81   Pulse 68   Temp (!) 96.5 F (35.8 C) (Tympanic)   Resp 16   Ht 5\' 6"  (1.676 m)   Wt 240 lb (108.9 kg)   SpO2 94%   BMI 38.74 kg/m  General:   Alert,  pleasant and cooperative in NAD Head:  Normocephalic and atraumatic. Neck:  Supple; no masses or thyromegaly. Lungs:  Clear throughout to auscultation, normal respiratory effort.    Heart:  +S1, +S2, Regular rate and rhythm, No edema. Abdomen:  Soft, nontender and nondistended. Normal bowel sounds, without guarding,  and without rebound.   Neurologic:  Alert and  oriented x4;  grossly normal neurologically.  Impression/Plan: [name]. is here for an endoscopy and colonoscopy  to be performed for  evaluation of esophageal varices and surveillance due to family  history of colon cancer.     Risks, benefits, limitations, and alternatives regarding endoscopy have been reviewed with the patient.  Questions have been answered.  All parties agreeable.   [name], MD  09/08/2017, 10:21 AM

## 2017-09-08 NOTE — Anesthesia Preprocedure Evaluation (Signed)
Anesthesia Evaluation  Patient identified by MRN, date of birth, ID band Patient awake    Reviewed: Allergy & Precautions, H&P , NPO status , Patient's Chart, lab work & pertinent test results, reviewed documented beta blocker date and time   History of Anesthesia Complications Negative for: history of anesthetic complications  Airway Mallampati: III  TM Distance: >3 FB Neck ROM: full    Dental  (+) Caps, Dental Advidsory Given, Teeth Intact   Pulmonary neg pulmonary ROS,           Cardiovascular Exercise Tolerance: Good negative cardio ROS       Neuro/Psych negative neurological ROS  negative psych ROS   GI/Hepatic negative GI ROS, NAFLD   Endo/Other  negative endocrine ROS  Renal/GU Renal disease (left kidney cyst)  negative genitourinary   Musculoskeletal   Abdominal   Peds  Hematology negative hematology ROS (+)   Anesthesia Other Findings Past Medical History: No date: Diverticulitis No date: History of kidney stones No date: Hypercholesteremia No date: Hypothyroidism No date: Peyronie's disease No date: Renal cyst, left No date: Seasonal allergies No date: Thrombocytopenia (H[facility])   Reproductive/Obstetrics negative OB ROS                             Anesthesia Physical Anesthesia Plan  ASA: II  Anesthesia Plan: General   Post-op Pain Management:    Induction: Intravenous  PONV Risk Score and Plan: 2 and Propofol infusion  Airway Management Planned: Nasal Cannula  Additional Equipment:   Intra-op Plan:   Post-operative Plan:   Informed Consent: I have reviewed the patients History and Physical, chart, labs and discussed the procedure including the risks, benefits and alternatives for the proposed anesthesia with the patient or authorized representative who has indicated his/her understanding and acceptance.   Dental Advisory Given  Plan Discussed with:  Anesthesiologist, CRNA and Surgeon  Anesthesia Plan Comments:         Anesthesia Quick Evaluation

## 2017-09-08 NOTE — Anesthesia Procedure Notes (Signed)
Performed by: Chenelle Benning, CRNA Pre-anesthesia Checklist: Patient identified, Emergency Drugs available, Suction available, Patient being monitored and Timeout performed Patient Re-evaluated:Patient Re-evaluated prior to induction Oxygen Delivery Method: Nasal cannula Induction Type: IV induction       

## 2017-09-08 NOTE — Op Note (Signed)
[facility] Gastroenterology Patient Name: Ryan Willis Procedure Date: 09/08/2017 10:30 AM MRN: [id number] Account #: [id number] Date of Birth: 22-Oct-1960 Admit Type: Outpatient Age: 57 Room: [facility] ENDO ROOM 1 Gender: Male Note Status: Finalized Procedure:            Upper GI endoscopy Indications:          Cirrhosis rule out esophageal varices Providers:            [name] MD, MD Referring MD:         Amada Jupiter. Carollee Herter (Referring MD) Medicines:            Monitored Anesthesia Care Complications:        No immediate complications. Procedure:            Pre-Anesthesia Assessment:                       - Prior to the procedure, a History and Physical was                        performed, and patient medications, allergies and                        sensitivities were reviewed. The patient's tolerance of                        previous anesthesia was reviewed.                       - The risks and benefits of the procedure and the                        sedation options and risks were discussed with the                        patient. All questions were answered and informed                        consent was obtained.                       - ASA Grade Assessment: III - A patient with severe                        systemic disease.                       After obtaining informed consent, the endoscope was                        passed under direct vision. Throughout the procedure,                        the patient's blood pressure, pulse, and oxygen                        saturations were monitored continuously. The Endoscope                        was introduced through the mouth, and advanced to the  third part of duodenum. The upper GI endoscopy was                        accomplished with ease. The patient tolerated the                        procedure well. Findings:      The examined esophagus was normal.      The examined  duodenum was normal.      Localized mild inflammation characterized by congestion (edema) and       erythema was found in the gastric antrum. Biopsies were taken with a       cold forceps for histology.      The cardia and gastric fundus were normal on retroflexion. Impression:           - Normal esophagus.                       - Normal examined duodenum.                       - Gastritis. Biopsied. Recommendation:       - Await pathology results.                       - Perform a colonoscopy today.                       - Repeat upper endoscopy in 3 years for surveillance. Procedure Code(s):    --- Professional ---                       [phone], Esophagogastroduodenoscopy, flexible, transoral;                        with biopsy, single or multiple Diagnosis Code(s):    --- Professional ---                       K29.70, Gastritis, unspecified, without bleeding                       K74.60, Unspecified cirrhosis of liver CPT copyright 2017 American Medical Association. All rights reserved. The codes documented in this report are preliminary and upon coder review may  be revised to meet current compliance requirements. [name], MD [name] MD, MD 09/08/2017 10:44:01 AM This report has been signed electronically. Number of Addenda: 0 Note Initiated On: 09/08/2017 10:30 AM      [facility]

## 2017-09-08 NOTE — Anesthesia Postprocedure Evaluation (Signed)
Anesthesia Post Note  Patient: Ryan Willis.  Procedure(s) Performed: ESOPHAGOGASTRODUODENOSCOPY (EGD) WITH PROPOFOL (N/A ) COLONOSCOPY WITH PROPOFOL (N/A )  Patient location during evaluation: Endoscopy Anesthesia Type: General Level of consciousness: awake and alert Pain management: pain level controlled Vital Signs Assessment: post-procedure vital signs reviewed and stable Respiratory status: spontaneous breathing, nonlabored ventilation, respiratory function stable and patient connected to nasal cannula oxygen Cardiovascular status: blood pressure returned to baseline and stable Postop Assessment: no apparent nausea or vomiting Anesthetic complications: no     Last Vitals:  Vitals:   09/08/17 1013 09/08/17 1058  BP: 124/81 (!) 100/51  Pulse: 68 77  Resp: 16 14  Temp: (!) 35.8 C   SpO2: 94% 94%    Last Pain:  Vitals:   09/08/17 1013  TempSrc: Tympanic  PainSc: 0-No pain                 Martha Clan

## 2017-09-08 NOTE — Anesthesia Post-op Follow-up Note (Signed)
Anesthesia QCDR form completed.        

## 2017-09-08 NOTE — Op Note (Signed)
[facility] Gastroenterology Patient Name: Ryan Willis Procedure Date: 09/08/2017 10:28 AM MRN: [id number] Account #: [id number] Date of Birth: 1960-09-30 Admit Type: Outpatient Age: 58 Room: [facility] ENDO ROOM 1 Gender: Male Note Status: Finalized Procedure:            Colonoscopy Indications:          Screening in patient at increased risk: Family history                        of 1st-degree relative with colorectal cancer Providers:            [name] MD, MD Referring MD:         Amada Jupiter. Carollee Herter (Referring MD) Medicines:            Monitored Anesthesia Care Complications:        No immediate complications. Procedure:            Pre-Anesthesia Assessment:                       - Prior to the procedure, a History and Physical was                        performed, and patient medications, allergies and                        sensitivities were reviewed. The patient's tolerance of                        previous anesthesia was reviewed.                       - The risks and benefits of the procedure and the                        sedation options and risks were discussed with the                        patient. All questions were answered and informed                        consent was obtained.                       - ASA Grade Assessment: III - A patient with severe                        systemic disease.                       After obtaining informed consent, the colonoscope was                        passed under direct vision. Throughout the procedure,                        the patient's blood pressure, pulse, and oxygen                        saturations were monitored continuously. The  Colonoscope was introduced through the anus and                        advanced to the the cecum, identified by the                        appendiceal orifice, IC valve and transillumination.                        The colonoscopy was performed  with ease. The patient                        tolerated the procedure well. The quality of the bowel                        preparation was poor. Findings:      The perianal and digital rectal examinations were normal.      A moderate amount of semi-liquid stool was found in the entire colon,       making visualization difficult.      The exam was otherwise without abnormality. Impression:           - Preparation of the colon was poor.                       - Stool in the entire examined colon.                       - The examination was otherwise normal.                       - No specimens collected. Recommendation:       - Discharge patient to home (with escort).                       - Resume previous diet.                       - Continue present medications.                       - Repeat colonoscopy in 3 months because the bowel                        preparation was suboptimal. Procedure Code(s):    --- Professional ---                       [phone], Colonoscopy, flexible; diagnostic, including                        collection of specimen(s) by brushing or washing, when                        performed (separate procedure) Diagnosis Code(s):    --- Professional ---                       Z80.0, Family history of malignant neoplasm of                        digestive organs CPT copyright 2017 American Medical Association. All rights reserved. The codes documented in this  report are preliminary and upon coder review may  be revised to meet current compliance requirements. [name], MD [name] MD, MD 09/08/2017 10:54:35 AM This report has been signed electronically. Number of Addenda: 0 Note Initiated On: 09/08/2017 10:28 AM Scope Withdrawal Time: 0 hours 4 minutes 30 seconds  Total Procedure Duration: 0 hours 7 minutes 24 seconds       [facility]

## 2017-09-09 ENCOUNTER — Encounter: Payer: Self-pay | Admitting: Gastroenterology

## 2017-09-09 LAB — SURGICAL PATHOLOGY

## 2017-09-14 ENCOUNTER — Other Ambulatory Visit: Payer: Self-pay | Admitting: Gastroenterology

## 2017-09-15 ENCOUNTER — Encounter: Admission: RE | Payer: Self-pay | Source: Ambulatory Visit

## 2017-09-15 ENCOUNTER — Inpatient Hospital Stay: Payer: BLUE CROSS/BLUE SHIELD | Attending: Internal Medicine | Admitting: Internal Medicine

## 2017-09-15 ENCOUNTER — Ambulatory Visit
Admission: RE | Admit: 2017-09-15 | Payer: BLUE CROSS/BLUE SHIELD | Source: Ambulatory Visit | Admitting: Gastroenterology

## 2017-09-15 ENCOUNTER — Encounter: Payer: Self-pay | Admitting: Internal Medicine

## 2017-09-15 VITALS — BP 137/81 | HR 80 | Temp 97.0°F | Resp 16 | Wt 227.7 lb

## 2017-09-15 DIAGNOSIS — R161 Splenomegaly, not elsewhere classified: Secondary | ICD-10-CM | POA: Diagnosis not present

## 2017-09-15 DIAGNOSIS — N486 Induration penis plastica: Secondary | ICD-10-CM | POA: Diagnosis not present

## 2017-09-15 DIAGNOSIS — D696 Thrombocytopenia, unspecified: Secondary | ICD-10-CM

## 2017-09-15 DIAGNOSIS — K7460 Unspecified cirrhosis of liver: Secondary | ICD-10-CM

## 2017-09-15 SURGERY — ESOPHAGOGASTRODUODENOSCOPY (EGD) WITH PROPOFOL
Anesthesia: General

## 2017-09-15 NOTE — Progress Notes (Signed)
[facility] NOTE  Patient Care Team: Karen Kitchens, MD as PCP - General (Family Medicine)  CHIEF COMPLAINTS/PURPOSE OF CONSULTATION: Thrombocytopenia  # THROMBOCYTOPENIA [97-108; incidental]; Hep B/hepC/HIV-NEG [[facility]] sec to cirrhosis/ splenomegaly  # Cirrhosis/ SPLENOMEGALY[US- 1000cc; CT A/P march 2019] s/p EGD/colo [may 2019- Dr.Anna]  # Peyronie disease-    No history exists.     HISTORY OF PRESENTING ILLNESS:  Ryan Willis. 57 y.o.  male newly diagnosed cirrhosis/splenomegaly and thrombocytopenia is here for follow-up.  In the interim patient had EGD colonoscopy-studies are fairly unremarkable.  He needs to have repeat colonoscopy because of poor colon prep.  Patient denies any blood in stools black or stools.  Denies any swelling in the legs.   Patient states that he is been trying to lose weight.  He has lost some weight.  Review of Systems  Constitutional: Positive for weight loss. Negative for chills, diaphoresis, fever and malaise/fatigue.  HENT: Negative for nosebleeds and sore throat.   Eyes: Negative for double vision.  Respiratory: Negative for cough, hemoptysis, sputum production, shortness of breath and wheezing.   Cardiovascular: Negative for chest pain, palpitations, orthopnea and leg swelling.  Gastrointestinal: Negative for abdominal pain, blood in stool, constipation, diarrhea, heartburn, melena, nausea and vomiting.  Genitourinary: Negative for dysuria, frequency and urgency.  Musculoskeletal: Negative for back pain and joint pain.  Skin: Negative.  Negative for itching and rash.  Neurological: Negative for dizziness, tingling, focal weakness, weakness and headaches.  Endo/Heme/Allergies: Does not bruise/bleed easily.  Psychiatric/Behavioral: Negative for depression. The patient is not nervous/anxious and does not have insomnia.     MEDICAL HISTORY:  Past Medical History:  Diagnosis Date  . Diverticulitis   . History  of kidney stones   . Hypercholesteremia   . Hypothyroidism   . Peyronie's disease   . Renal cyst, left   . Seasonal allergies   . Thrombocytopenia ([place])     SURGICAL HISTORY: Past Surgical History:  Procedure Laterality Date  . COLONOSCOPY  2006  . COLONOSCOPY WITH PROPOFOL N/A 09/08/2017   Procedure: COLONOSCOPY WITH PROPOFOL;  Surgeon: [name], MD;  Location[facility] ENDOSCOPY;  Service: Gastroenterology;  Laterality: N/A;  . ESOPHAGOGASTRODUODENOSCOPY (EGD) WITH PROPOFOL N/A 09/08/2017   Procedure: ESOPHAGOGASTRODUODENOSCOPY (EGD) WITH PROPOFOL;  Surgeon: [name], MD;  Location: [facility] ENDOSCOPY;  Service: Gastroenterology;  Laterality: N/A;  . HERNIA REPAIR  1981  . SHOULDER SURGERY Right 2001    SOCIAL HISTORY:  Social History   Socioeconomic History  . Marital status: Married    Spouse name: Not on file  . Number of children: Not on file  . Years of education: Not on file  . Highest education level: Not on file  Occupational History  . Not on file  Social Needs  . Financial resource strain: Not on file  . Food insecurity:    Worry: Not on file    Inability: Not on file  . Transportation needs:    Medical: Not on file    Non-medical: Not on file  Tobacco Use  . Smoking status: Never Smoker  . Smokeless tobacco: Never Used  Substance and Sexual Activity  . Alcohol use: Not Currently    Comment: "1 or 2 year drinks a year."  . Drug use: No  . Sexual activity: Not on file  Lifestyle  . Physical activity:    Days per week: Not on file    Minutes per session: Not on file  . Stress: Not on file  Relationships  . Social connections:    Talks on phone: Not on file    Gets together: Not on file    Attends religious service: Not on file    Active member of club or organization: Not on file    Attends meetings of clubs or organizations: Not on file    Relationship status: Not on file  . Intimate partner violence:    Fear of current or ex partner: Not on file     Emotionally abused: Not on file    Physically abused: Not on file    Forced sexual activity: Not on file  Other Topics Concern  . Not on file  Social History Narrative   retd. Engineer, structural; part time information specialist; never smoked; no alcohol; live in [place]. Lives with wife.     FAMILY HISTORY: colon father- 83 year; mother- kidney cancer; died of lung cancer [2018] Family History  Problem Relation Age of Onset  . Kidney cancer Mother   . Colon cancer Father     ALLERGIES:  has No Known Allergies.  MEDICATIONS:  No current outpatient medications on file.   No current facility-administered medications for this visit.       Marland Kitchen  PHYSICAL EXAMINATION: ECOG PERFORMANCE STATUS: 0 - Asymptomatic  Vitals:   09/15/17 0952 09/15/17 0957  BP:  137/81  Pulse:  80  Resp: 16 16  Temp:  (!) 97 F (36.1 C)   Filed Weights   09/15/17 0952  Weight: 227 lb 11.8 oz (103.3 kg)   GENERAL: Well-nourished well-developed; Alert, no distress and comfortable.  He is alone. EYES: no pallor or icterus OROPHARYNX: no thrush or ulceration; NECK: supple; no lymph nodes felt. LYMPH:  no palpable lymphadenopathy in the axillary or inguinal regions LUNGS: Decreased breath sounds auscultation bilaterally. No wheeze or crackles HEART/CVS: regular rate & rhythm and no murmurs; No lower extremity edema ABDOMEN:abdomen soft, non-tender and normal bowel sounds.  Positive for splenomegaly.  Positive for hepatomegaly. Musculoskeletal:no cyanosis of digits and no clubbing  PSYCH: alert & oriented x 3 with fluent speech NEURO: no focal motor/sensory deficits SKIN:  no rashes or significant lesions   LABORATORY DATA:  I have reviewed the data as listed Lab Results  Component Value Date   WBC 4.1 08/24/2017   HGB 15.2 08/24/2017   HCT 42.6 08/24/2017   MCV 100 (H) 08/24/2017   PLT 95 (LL) 08/24/2017   Recent Labs    08/24/17 0923  NA 141  K 4.5  CL 107*  CO2 22  GLUCOSE 85  BUN  16  CREATININE 0.86  CALCIUM 9.2  GFRNONAA 96  GFRAA 111  PROT 7.0  ALBUMIN 4.2  AST 49*  ALT 36  ALKPHOS 99  BILITOT 0.7    RADIOGRAPHIC STUDIES: I have personally reviewed the radiological images as listed and agreed with the findings in the report. No results found.  IMPRESSION: 1. Findings are consistent with hepatic cirrhosis and portal hypertension with splenomegaly, a recanalized left paraumbilical vein and small distal esophageal varices. 2. Nonobstructing bilateral renal calculi. No evidence of ureteral calculus or hydronephrosis. 3. Cholelithiasis. 4. Enlarging hiatal hernia containing fat and a small amount of fluid. 5. Distal colonic diverticulosis.   Electronically Signed   By: Richardean Sale M.D.   On: 07/15/2017 15:53   ASSESSMENT & PLAN:   Thrombocytopenia ([place]) # Thrombocytopenia- January 2019 platelets and will between 106-97; with splenomegaly.  Today platelets are 98.  Fairly asymptomatic continue to monitor.  #  Cirrhosis/splenomegaly-compensated; defer to further management to GI.  Discussed my concerns for development of HCC-although risk is small; would recommend AFP/ultrasound every 6 months.  Agree with lifestyle modification.  # Peyronie's disease stable; defer to urology for further work-up/management.  # follow up in 6 months/labs- AFP   All questions were answered. The patient knows to call the clinic with any problems, questions or concerns.    Cammie Sickle, MD 09/15/2017 1:48 PM

## 2017-09-15 NOTE — Assessment & Plan Note (Addendum)
#   Thrombocytopenia- January 2019 platelets and will between 106-97; with splenomegaly.  Today platelets are 98.  Fairly asymptomatic continue to monitor.  #Cirrhosis/splenomegaly-compensated; defer to further management to GI.  Discussed my concerns for development of HCC-although risk is small; would recommend AFP/ultrasound every 6 months.  Agree with lifestyle modification.  # Peyronie's disease stable; defer to urology for further work-up/management.  # follow up in 6 months/labs- AFP

## 2017-09-16 LAB — COPPER, URINE - RANDOM OR 24 HOUR
Copper / Creatinine Ratio: 16 ug/g creat (ref 0–49)
Copper, 24H Ur: 26 ug/24 hr (ref 3–35)
Copper, Ur: 10 ug/L
Creatinine(Crt),U: 0.63 g/L (ref 0.30–3.00)

## 2017-09-25 ENCOUNTER — Encounter: Payer: Self-pay | Admitting: Internal Medicine

## 2017-10-01 ENCOUNTER — Encounter: Payer: Self-pay | Admitting: Gastroenterology

## 2017-11-17 ENCOUNTER — Other Ambulatory Visit: Payer: Self-pay

## 2017-11-17 ENCOUNTER — Ambulatory Visit: Payer: BLUE CROSS/BLUE SHIELD | Admitting: Gastroenterology

## 2017-11-17 ENCOUNTER — Encounter: Payer: Self-pay | Admitting: Gastroenterology

## 2017-11-17 VITALS — BP 129/84 | HR 62 | Resp 16 | Ht 66.0 in | Wt 215.0 lb

## 2017-11-17 DIAGNOSIS — Z1211 Encounter for screening for malignant neoplasm of colon: Secondary | ICD-10-CM

## 2017-11-17 DIAGNOSIS — K7460 Unspecified cirrhosis of liver: Secondary | ICD-10-CM | POA: Diagnosis not present

## 2017-11-17 DIAGNOSIS — Z800 Family history of malignant neoplasm of digestive organs: Secondary | ICD-10-CM

## 2017-11-17 NOTE — Progress Notes (Signed)
[name] MD, MRCP(U.K) [address]  [place]  [place], [place] [address]  Main: [phone]  Fax: [phone].   Primary Care Physician: Karen Kitchens, MD  Primary Gastroenterologist:  Dr. [name]   Chief Complaint  Patient presents with  . Follow-up  . Cirrhosis    HPI: Ryan Willis. is a 57 y.o. male    Summary of history :  He was last seen on 08/18/17 for liver cirrhosis. RUQ USG in 06/2017 showed a normal liver, splenomegaly and Cholelithiasis with mild gallbladder wall thickening.  The patient had a CT scan on 07/15/17 which showed features of cirrhosis and portal hypertension with with splenomegaly, a recanalized left paraumbilical vein and small distal esophageal varices.His dad had colon cancer that went into the liver. He last colonoscopy was atleast 10 years back .   Interval history   08/18/2017-  11/17/2017  09/08/17- EGD: No varices antral gastropathy on bx. colonoscopy poor prep   Labs 09/14/17 : Normal 24 hour urinary copper 08/24/17 : Hep B surface ab- positive,  ANA ,SM ab,LKM, immunoglubulins ,celiac serology,A1AT,HIV,Hep C ab,Hep B cv ab , Hep B e ab,HbsAg,  , -  Normal/Negative. Hb 15 Plt 95, ferritin 188. Immune to Hep A.INR 1.2 ,Cr 0.86 Ceruloplasmin low 15.2 .    Lost weight intentionally - 32 lbs . Changed the way he eats.    No current outpatient medications on file.   No current facility-administered medications for this visit.     Allergies as of 11/17/2017  . (No Known Allergies)    ROS:  General: Negative for anorexia, weight loss, fever, chills, fatigue, weakness. ENT: Negative for hoarseness, difficulty swallowing , nasal congestion. CV: Negative for chest pain, angina, palpitations, dyspnea on exertion, peripheral edema.  Respiratory: Negative for dyspnea at rest, dyspnea on exertion, cough, sputum, wheezing.  GI: See history of present illness. GU:  Negative for dysuria, hematuria, urinary incontinence, urinary  frequency, nocturnal urination.  Endo: Negative for unusual weight change.    Physical Examination:   BP 129/84 (BP Location: Left Arm, Patient Position: Sitting, Cuff Size: Large)   Pulse 62   Resp 16   Ht 5\' 6"  (1.676 m)   Wt 215 lb (97.5 kg)   BMI 34.70 kg/m   General: Well-nourished, well-developed in no acute distress.  Eyes: No icterus. Conjunctivae pink. Mouth: Oropharyngeal mucosa moist and pink , no lesions erythema or exudate. Lungs: Clear to auscultation bilaterally. Non-labored. Heart: Regular rate and rhythm, no murmurs rubs or gallops.  Abdomen: Bowel sounds are normal, nontender, nondistended, no hepatosplenomegaly or masses, no abdominal bruits or hernia , no rebound or guarding.   Extremities: No lower extremity edema. No clubbing or deformities. Neuro: Alert and oriented x 3.  Grossly intact. Skin: Warm and dry, no jaundice.   Psych: Alert and cooperative, normal mood and affect.   Imaging Studies: No results found.  Assessment and Plan:   Ryan Willis. is a 57 y.o. y/o male here to follow up for for liver cirrhosis. The CT scan shows features of portal hypertension with recannalization of the umbilical vein , esophageal varices and splenomegaly. I suspect most likely that the thrombocytopenia is related to portal hypertension . Childs A. Etiology of liver disease likely NAFLD. Doing well and losing weight    Plan  1. Low ceruloplasmin :Slit lamp exam to r/o KF rings performed - will get report from his doctor  2. EGD to screen for esophageal varices in 08/2020  3. Abdominal ultrasound to screen for HCC:  due  12/2017   4. Colonoscopy due to family history of colon cancer needs to be repeated due to poor prep.  5. Patient information on NAFLD   6. Vaccination including Flu, pneumococcal, Tdap with Karen Kitchens, MD    I have discussed alternative options, risks & benefits,  which include, but are not limited to, bleeding, infection,  perforation,respiratory complication & drug reaction.  The patient agrees with this plan & written consent will be obtained.     Dr [name]  MD,MRCP [facility]) Follow up in 12 weeks

## 2018-01-01 ENCOUNTER — Encounter
Admission: RE | Disposition: A | Discharge status: Home / Self Care | Payer: Self-pay | Source: Ambulatory Visit | Attending: Gastroenterology

## 2018-01-01 ENCOUNTER — Ambulatory Visit: Payer: BLUE CROSS/BLUE SHIELD | Admitting: Certified Registered Nurse Anesthetist

## 2018-01-01 ENCOUNTER — Encounter: Payer: Self-pay | Admitting: Certified Registered Nurse Anesthetist

## 2018-01-01 ENCOUNTER — Ambulatory Visit
Admission: RE | Admit: 2018-01-01 | Discharge: 2018-01-01 | Disposition: A | Discharge status: Home / Self Care | Payer: BLUE CROSS/BLUE SHIELD | Source: Ambulatory Visit | Attending: Gastroenterology | Admitting: Gastroenterology

## 2018-01-01 DIAGNOSIS — K5730 Diverticulosis of large intestine without perforation or abscess without bleeding: Secondary | ICD-10-CM | POA: Insufficient documentation

## 2018-01-01 DIAGNOSIS — Z1211 Encounter for screening for malignant neoplasm of colon: Secondary | ICD-10-CM | POA: Diagnosis not present

## 2018-01-01 DIAGNOSIS — D122 Benign neoplasm of ascending colon: Secondary | ICD-10-CM | POA: Diagnosis not present

## 2018-01-01 DIAGNOSIS — Z800 Family history of malignant neoplasm of digestive organs: Secondary | ICD-10-CM

## 2018-01-01 DIAGNOSIS — D696 Thrombocytopenia, unspecified: Secondary | ICD-10-CM | POA: Diagnosis not present

## 2018-01-01 DIAGNOSIS — G4730 Sleep apnea, unspecified: Secondary | ICD-10-CM | POA: Insufficient documentation

## 2018-01-01 DIAGNOSIS — K640 First degree hemorrhoids: Secondary | ICD-10-CM | POA: Insufficient documentation

## 2018-01-01 HISTORY — PX: COLONOSCOPY WITH PROPOFOL: SHX5780

## 2018-01-01 SURGERY — COLONOSCOPY WITH PROPOFOL
Anesthesia: General

## 2018-01-01 MED ORDER — LIDOCAINE HCL (PF) 1 % IJ SOLN: 2.0000 mL | Freq: Once | INTRAMUSCULAR | Status: DC

## 2018-01-01 MED ORDER — PROPOFOL 10 MG/ML IV BOLUS
INTRAVENOUS | Status: DC | PRN
  Administered 2018-01-01: 70 mg via INTRAVENOUS

## 2018-01-01 MED ORDER — SODIUM CHLORIDE 0.9 % IV SOLN
INTRAVENOUS | Status: DC
  Administered 2018-01-01: 08:00:00 via INTRAVENOUS

## 2018-01-01 MED ORDER — PROPOFOL 500 MG/50ML IV EMUL
INTRAVENOUS | Status: AC
  Filled 2018-01-01: qty 50

## 2018-01-01 MED ORDER — LIDOCAINE HCL (PF) 2 % IJ SOLN
INTRAMUSCULAR | Status: AC
  Filled 2018-01-01: qty 10

## 2018-01-01 MED ORDER — PROPOFOL 500 MG/50ML IV EMUL
INTRAVENOUS | Status: DC | PRN
  Administered 2018-01-01: 175 ug/kg/min via INTRAVENOUS

## 2018-01-01 MED ORDER — LIDOCAINE HCL (CARDIAC) PF 100 MG/5ML IV SOSY
PREFILLED_SYRINGE | INTRAVENOUS | Status: DC | PRN
  Administered 2018-01-01: 50 mg via INTRAVENOUS

## 2018-01-01 NOTE — H&P (Signed)
[name], MD [address] [place], [place], [place], [address] [address], [place], [place], [place], [address] Phone: [phone]  Fax: [phone]  Primary Care Physician:  Karen Kitchens, MD   Pre-Procedure History & Physical: HPI:  Ryan Willis. is a 57 y.o. male is here for an colonoscopy.   Past Medical History:  Diagnosis Date  . Diverticulitis   . History of kidney stones   . Hypercholesteremia   . Hypothyroidism   . Peyronie's disease   . Renal cyst, left   . Seasonal allergies   . Thrombocytopenia ([place])     Past Surgical History:  Procedure Laterality Date  . COLONOSCOPY  2006  . COLONOSCOPY WITH PROPOFOL N/A 09/08/2017   Procedure: COLONOSCOPY WITH PROPOFOL;  Surgeon: [name], MD;  Location: [facility] ENDOSCOPY;  Service: Gastroenterology;  Laterality: N/A;  . ESOPHAGOGASTRODUODENOSCOPY (EGD) WITH PROPOFOL N/A 09/08/2017   Procedure: ESOPHAGOGASTRODUODENOSCOPY (EGD) WITH PROPOFOL;  Surgeon: [name], MD;  Location: [facility] ENDOSCOPY;  Service: Gastroenterology;  Laterality: N/A;  . HERNIA REPAIR  1981  . SHOULDER SURGERY Right 2001    Prior to Admission medications   Not on File    Allergies as of 11/17/2017  . (No Known Allergies)    Family History  Problem Relation Age of Onset  . Kidney cancer Mother   . Colon cancer Father     Social History   Socioeconomic History  . Marital status: Married    Spouse name: Not on file  . Number of children: Not on file  . Years of education: Not on file  . Highest education level: Not on file  Occupational History  . Not on file  Social Needs  . Financial resource strain: Not on file  . Food insecurity:    Worry: Not on file    Inability: Not on file  . Transportation needs:    Medical: Not on file    Non-medical: Not on file  Tobacco Use  . Smoking status: Never Smoker  . Smokeless tobacco: Never Used  Substance and Sexual Activity  . Alcohol use: Not Currently    Comment:  "1 or 2 year drinks a year."  . Drug use: No  . Sexual activity: Not on file  Lifestyle  . Physical activity:    Days per week: Not on file    Minutes per session: Not on file  . Stress: Not on file  Relationships  . Social connections:    Talks on phone: Not on file    Gets together: Not on file    Attends religious service: Not on file    Active member of club or organization: Not on file    Attends meetings of clubs or organizations: Not on file    Relationship status: Not on file  . Intimate partner violence:    Fear of current or ex partner: Not on file    Emotionally abused: Not on file    Physically abused: Not on file    Forced sexual activity: Not on file  Other Topics Concern  . Not on file  Social History Narrative   retd. Engineer, structural; part time information specialist; never smoked; no alcohol; live in [place]. Lives with wife.     Review of Systems: See HPI, otherwise negative ROS  Physical Exam: BP 135/82   Pulse 66   Temp (!) 96 F (35.6 C)   Resp 16   Ht 5\' 6"  (1.676 m)   Wt  93 kg   SpO2 95%   BMI 33.09 kg/m  General:   Alert,  pleasant and cooperative in NAD Head:  Normocephalic and atraumatic. Neck:  Supple; no masses or thyromegaly. Lungs:  Clear throughout to auscultation, normal respiratory effort.    Heart:  +S1, +S2, Regular rate and rhythm, No edema. Abdomen:  Soft, nontender and nondistended. Normal bowel sounds, without guarding, and without rebound.   Neurologic:  Alert and  oriented x4;  grossly normal neurologically.  Impression/Plan: [name]. is here for an colonoscopy to be performed for Screening colonoscopy high  risk   Risks, benefits, limitations, and alternatives regarding  colonoscopy have been reviewed with the patient.  Questions have been answered.  All parties agreeable.   [name], MD  01/01/2018, 8:09 AM

## 2018-01-01 NOTE — Op Note (Signed)
[facility] Gastroenterology Patient Name: Ryan Willis Procedure Date: 01/01/2018 7:24 AM MRN: [id number] Account #: [id number] Date of Birth: 05/16/1960 Admit Type: Outpatient Age: 57 Room: [facility] ENDO ROOM 4 Gender: Male Note Status: Finalized Procedure:            Colonoscopy Indications:          Screening in patient at increased risk: Family history                        of 1st-degree relative with colorectal cancer Providers:            [name] MD, MD Medicines:            Monitored Anesthesia Care Complications:        No immediate complications. Procedure:            Pre-Anesthesia Assessment:                       - Prior to the procedure, a History and Physical was                        performed, and patient medications, allergies and                        sensitivities were reviewed. The patient's tolerance of                        previous anesthesia was reviewed.                       - The risks and benefits of the procedure and the                        sedation options and risks were discussed with the                        patient. All questions were answered and informed                        consent was obtained.                       - ASA Grade Assessment: III - A patient with severe                        systemic disease.                       After obtaining informed consent, the colonoscope was                        passed under direct vision. Throughout the procedure,                        the patient's blood pressure, pulse, and oxygen                        saturations were monitored continuously. The                        Colonoscope was introduced through the anus and  advanced to the the cecum, identified by the                        appendiceal orifice, IC valve and transillumination.                        The colonoscopy was performed with ease. The patient                        tolerated the  procedure well. The quality of the bowel                        preparation was good. Findings:      The perianal and digital rectal examinations were normal.      A 3 mm polyp was found in the proximal ascending colon. The polyp was       sessile. The polyp was removed with a cold biopsy forceps. Resection and       retrieval were complete. To prevent bleeding after the polypectomy, one       hemostatic clip was successfully placed. There was no bleeding at the       end of the maneuver.      Multiple small-mouthed diverticula were found in the sigmoid colon.      Non-bleeding internal hemorrhoids were found during retroflexion. The       hemorrhoids were medium-sized and Grade I (internal hemorrhoids that do       not prolapse).      The exam was otherwise without abnormality on direct and retroflexion       views. Impression:           - One 3 mm polyp in the proximal ascending colon,                        removed with a cold biopsy forceps. Resected and                        retrieved. Clip was placed.                       - Diverticulosis in the sigmoid colon.                       - Non-bleeding internal hemorrhoids.                       - The examination was otherwise normal on direct and                        retroflexion views. Recommendation:       - Discharge patient to home (with escort).                       - Resume previous diet.                       - Continue present medications.                       - Await pathology results.                       - Repeat colonoscopy  in 5 years for surveillance. Procedure Code(s):    --- Professional ---                       [phone], Colonoscopy, flexible; with biopsy, single or                        multiple Diagnosis Code(s):    --- Professional ---                       Z80.0, Family history of malignant neoplasm of                        digestive organs                       D12.2, Benign neoplasm of ascending colon                        K64.0, First degree hemorrhoids                       K57.30, Diverticulosis of large intestine without                        perforation or abscess without bleeding CPT copyright 2017 American Medical Association. All rights reserved. The codes documented in this report are preliminary and upon coder review may  be revised to meet current compliance requirements. [name], MD [name] MD, MD 01/01/2018 8:36:10 AM This report has been signed electronically. Number of Addenda: 0 Note Initiated On: 01/01/2018 7:24 AM Scope Withdrawal Time: 0 hours 16 minutes 31 seconds  Total Procedure Duration: 0 hours 18 minutes 34 seconds       [facility]

## 2018-01-01 NOTE — Transfer of Care (Signed)
Immediate Anesthesia Transfer of Care Note  Patient: Ryan Willis.  Procedure(s) Performed: COLONOSCOPY WITH PROPOFOL (N/A )  Patient Location: PACU  Anesthesia Type:General  Level of Consciousness: awake, alert  and oriented  Airway & Oxygen Therapy: Patient Spontanous Breathing and Patient connected to nasal cannula oxygen  Post-op Assessment: Report given to RN and Post -op Vital signs reviewed and stable  Post vital signs: Reviewed and stable  Last Vitals:  Vitals Value Taken Time  BP 101/62 01/01/2018  8:40 AM  Temp 36.6 C 01/01/2018  8:38 AM  Pulse 71 01/01/2018  8:41 AM  Resp 16 01/01/2018  8:41 AM  SpO2 95 % 01/01/2018  8:41 AM  Vitals shown include unvalidated device data.  Last Pain:  Vitals:   01/01/18 0838  TempSrc: Tympanic  PainSc: Asleep         Complications: No apparent anesthesia complications

## 2018-01-01 NOTE — Anesthesia Postprocedure Evaluation (Signed)
Anesthesia Post Note  Patient: [name].  Procedure(s) Performed: COLONOSCOPY WITH PROPOFOL (N/A )  Patient location during evaluation: PACU Anesthesia Type: General Level of consciousness: awake and alert Pain management: pain level controlled Vital Signs Assessment: post-procedure vital signs reviewed and stable Respiratory status: spontaneous breathing, nonlabored ventilation, respiratory function stable and patient connected to nasal cannula oxygen Cardiovascular status: blood pressure returned to baseline and stable Postop Assessment: no apparent nausea or vomiting Anesthetic complications: no     Last Vitals:  Vitals:   01/01/18 0848 01/01/18 0858  BP: 105/76 113/69  Pulse: 70 (!) 58  Resp: 19 15  Temp:    SpO2: 96% 93%    Last Pain:  Vitals:   01/01/18 0858  TempSrc:   PainSc: 0-No pain                 [name]

## 2018-01-01 NOTE — Anesthesia Preprocedure Evaluation (Signed)
Anesthesia Evaluation  Patient identified by MRN, date of birth, ID band Patient awake    Reviewed: Allergy & Precautions, H&P , NPO status , Patient's Chart, lab work & pertinent test results, reviewed documented beta blocker date and time   Airway Mallampati: II   Neck ROM: full    Dental  (+) Poor Dentition   Pulmonary neg pulmonary ROS, sleep apnea ,    Pulmonary exam normal        Cardiovascular Exercise Tolerance: Good negative cardio ROS Normal cardiovascular exam Rhythm:regular Rate:Normal     Neuro/Psych negative neurological ROS  negative psych ROS   GI/Hepatic negative GI ROS, Neg liver ROS,   Endo/Other  negative endocrine ROSHypothyroidism   Renal/GU Renal diseasenegative Renal ROS  negative genitourinary   Musculoskeletal   Abdominal   Peds  Hematology negative hematology ROS (+)   Anesthesia Other Findings Past Medical History: No date: Diverticulitis No date: History of kidney stones No date: Hypercholesteremia No date: Hypothyroidism No date: Peyronie's disease No date: Renal cyst, left No date: Seasonal allergies No date: Thrombocytopenia ([place]) Past Surgical History: 2006: COLONOSCOPY 09/08/2017: COLONOSCOPY WITH PROPOFOL; N/A     Comment:  Procedure: COLONOSCOPY WITH PROPOFOL;  Surgeon: [name], MD;  Location: [facility] ENDOSCOPY;  Service:               Gastroenterology;  Laterality: N/A; 09/08/2017: ESOPHAGOGASTRODUODENOSCOPY (EGD) WITH PROPOFOL; N/A     Comment:  Procedure: ESOPHAGOGASTRODUODENOSCOPY (EGD) WITH               PROPOFOL;  Surgeon: [name], MD;  Location: [facility]               ENDOSCOPY;  Service: Gastroenterology;  Laterality: N/A; 1981: HERNIA REPAIR 2001: SHOULDER SURGERY; Right BMI    Body Mass Index:  33.09 kg/m     Reproductive/Obstetrics negative OB ROS                             Anesthesia Physical Anesthesia  Plan  ASA: III  Anesthesia Plan: General   Post-op Pain Management:    Induction:   PONV Risk Score and Plan:   Airway Management Planned:   Additional Equipment:   Intra-op Plan:   Post-operative Plan:   Informed Consent: I have reviewed the patients History and Physical, chart, labs and discussed the procedure including the risks, benefits and alternatives for the proposed anesthesia with the patient or authorized representative who has indicated his/her understanding and acceptance.   Dental Advisory Given  Plan Discussed with: CRNA  Anesthesia Plan Comments:         Anesthesia Quick Evaluation

## 2018-01-01 NOTE — Anesthesia Post-op Follow-up Note (Signed)
Anesthesia QCDR form completed.        

## 2018-01-01 NOTE — Anesthesia Procedure Notes (Signed)
Date/Time: 01/01/2018 8:10 AM Performed by: Johnna Acosta, CRNA Pre-anesthesia Checklist: Patient identified, Emergency Drugs available, Suction available, Patient being monitored and Timeout performed Patient Re-evaluated:Patient Re-evaluated prior to induction Oxygen Delivery Method: Nasal cannula Preoxygenation: Pre-oxygenation with 100% oxygen

## 2018-01-04 ENCOUNTER — Encounter: Payer: Self-pay | Admitting: Gastroenterology

## 2018-01-04 LAB — SURGICAL PATHOLOGY

## 2018-01-07 ENCOUNTER — Encounter: Payer: Self-pay | Admitting: Gastroenterology

## 2018-02-17 ENCOUNTER — Encounter: Payer: Self-pay | Admitting: Gastroenterology

## 2018-02-17 ENCOUNTER — Ambulatory Visit: Payer: BLUE CROSS/BLUE SHIELD | Admitting: Gastroenterology

## 2018-02-17 VITALS — BP 112/70 | HR 59 | Ht 66.0 in | Wt 201.8 lb

## 2018-02-17 DIAGNOSIS — K7460 Unspecified cirrhosis of liver: Secondary | ICD-10-CM | POA: Diagnosis not present

## 2018-02-17 NOTE — Progress Notes (Signed)
[name] MD, MRCP(U.K) [address]  [place]  [place], [place] [address]  Main: [phone]  Fax: [phone]   Primary Care Physician: Karen Kitchens, MD  Primary Gastroenterologist:  Dr. [name]   Chief Complaint  Patient presents with  . Follow-up    Cirrhosis of liver    HPI: Ryan Willis. is a 57 y.o. male   Summary of history :  He was last seen on 11/17/17 for liver cirrhosis likely secondary to NAFLD. RUQ USG in 06/2017 showed a normal liver, splenomegaly andCholelithiasis with mild gallbladder wall thickening. The patient had a CT scan on 07/15/17 which showed features of cirrhosis and portal hypertension withwith splenomegaly, a recanalized left paraumbilical vein and small distal esophageal varices.His dad had colon cancer that went into the liver. He last colonoscopy was atleast 10 years back .  09/08/17- EGD: No varices antral gastropathy on bx. colonoscopy poor prep   Labs 09/14/17 : Normal 24 hour urinary copper 08/24/17 : Hep B surface ab- positive,  ANA ,SM ab,LKM, immunoglubulins ,celiac serology,A1AT,HIV,Hep C ab,Hep B cv ab , Hep B e ab,HbsAg,  , -  Normal/Negative. Hb 15 Plt 95, ferritin 188. Immune to Hep A.INR 1.2 ,Cr 0.86 Ceruloplasmin low 15.2 .    Interval history 11/17/2017-02/17/18   01/01/18 : colonoscopy- 3 mm tubular adenoma resected Lost 14 lbs since last visit - intentionally .Changed the way he eats.    Sleeps well no issues. Not on any meds.   BP 112/70   Pulse (!) 59   Ht 5\' 6"  (1.676 m)   Wt 201 lb 12.8 oz (91.5 kg)   BMI 32.57 kg/m     No current outpatient medications on file.   No current facility-administered medications for this visit.     Allergies as of 02/17/2018  . (No Known Allergies)    ROS:  General: Negative for anorexia, weight loss, fever, chills, fatigue, weakness. ENT: Negative for hoarseness, difficulty swallowing , nasal congestion. CV: Negative for chest pain, angina,  palpitations, dyspnea on exertion, peripheral edema.  Respiratory: Negative for dyspnea at rest, dyspnea on exertion, cough, sputum, wheezing.  GI: See history of present illness. GU:  Negative for dysuria, hematuria, urinary incontinence, urinary frequency, nocturnal urination.  Endo: Negative for unusual weight change.    Physical Examination:   BP 112/70   Pulse (!) 59   Ht 5\' 6"  (1.676 m)   Wt 201 lb 12.8 oz (91.5 kg)   BMI 32.57 kg/m   General: Well-nourished, well-developed in no acute distress.  Eyes: No icterus. Conjunctivae pink. Mouth: Oropharyngeal mucosa moist and pink , no lesions erythema or exudate. Lungs: Clear to auscultation bilaterally. Non-labored. Heart: Regular rate and rhythm, no murmurs rubs or gallops.  Abdomen: Bowel sounds are normal, nontender, nondistended, no hepatosplenomegaly or masses, no abdominal bruits or hernia , no rebound or guarding.   Extremities: No lower extremity edema. No clubbing or deformities. Neuro: Alert and oriented x 3.  Grossly intact. Skin: Warm and dry, no jaundice.   Psych: Alert and cooperative, normal mood and affect.   Imaging Studies: No results found.  Assessment and Plan:   Ryan Willis. is a 57 y.o. y/o male here to follow up for for liver cirrhosis with portal hypertension  . Childs A. Etiology of liver disease likely NAFLD.Immune to Hep A/B Doing well and losing weight    Plan  1.Low ceruloplasmin :Slit lamp exam to r/o KF rings performed - will get report  from his doctor  2.EGD to screen for esophageal varices in 08/2020  3.Abdominal ultrasound to screen for HCC: due now   4. Colonoscopy due to family history of colon cancer needs to be repeated due to poor prep.  5. Patient information on NAFLD, congratulated him on the weight loss- will check labs for MELD score as well as for lipids. Continue life style changes, exercise, eating healthy and control of cardiovascular risk factors.   6.  Vaccination including Flu, pneumococcal, Tdap with Karen Kitchens, MD     Dr [name]  MD,MRCP [facility]) Follow up in 6 months

## 2018-02-18 LAB — PROTIME-INR
INR: 1.2 (ref 0.8–1.2)
PROTHROMBIN TIME: 12 s (ref 9.1–12.0)

## 2018-02-18 LAB — CBC WITH DIFFERENTIAL/PLATELET
BASOS ABS: 0 10*3/uL (ref 0.0–0.2)
BASOS: 1 %
EOS (ABSOLUTE): 0.1 10*3/uL (ref 0.0–0.4)
EOS: 2 %
HEMATOCRIT: 42.8 % (ref 37.5–51.0)
HEMOGLOBIN: 14.9 g/dL (ref 13.0–17.7)
IMMATURE GRANS (ABS): 0 10*3/uL (ref 0.0–0.1)
Immature Granulocytes: 0 %
LYMPHS: 23 %
Lymphocytes Absolute: 0.9 10*3/uL (ref 0.7–3.1)
MCH: 34.1 pg — ABNORMAL HIGH (ref 26.6–33.0)
MCHC: 34.8 g/dL (ref 31.5–35.7)
MCV: 98 fL — ABNORMAL HIGH (ref 79–97)
MONOCYTES: 9 %
Monocytes Absolute: 0.3 10*3/uL (ref 0.1–0.9)
NEUTROS ABS: 2.5 10*3/uL (ref 1.4–7.0)
Neutrophils: 65 %
Platelets: 92 10*3/uL — CL (ref 150–450)
RBC: 4.37 x10E6/uL (ref 4.14–5.80)
RDW: 12.1 % — ABNORMAL LOW (ref 12.3–15.4)
WBC: 3.9 10*3/uL (ref 3.4–10.8)

## 2018-02-18 LAB — COMPREHENSIVE METABOLIC PANEL
ALBUMIN: 4.6 g/dL (ref 3.5–5.5)
ALK PHOS: 90 IU/L (ref 39–117)
ALT: 31 IU/L (ref 0–44)
AST: 37 IU/L (ref 0–40)
Albumin/Globulin Ratio: 1.8 (ref 1.2–2.2)
BUN / CREAT RATIO: 15 (ref 9–20)
BUN: 14 mg/dL (ref 6–24)
Bilirubin Total: 1.2 mg/dL (ref 0.0–1.2)
CO2: 26 mmol/L (ref 20–29)
CREATININE: 0.92 mg/dL (ref 0.76–1.27)
Calcium: 9.7 mg/dL (ref 8.7–10.2)
Chloride: 103 mmol/L (ref 96–106)
GFR calc Af Amer: 106 mL/min/{1.73_m2} (ref >59)
GFR calc non Af Amer: 92 mL/min/{1.73_m2} (ref >59)
GLOBULIN, TOTAL: 2.5 g/dL (ref 1.5–4.5)
Glucose: 90 mg/dL (ref 65–99)
Potassium: 4.4 mmol/L (ref 3.5–5.2)
Sodium: 142 mmol/L (ref 134–144)
Total Protein: 7.1 g/dL (ref 6.0–8.5)

## 2018-02-18 LAB — LIPID PANEL WITH LDL/HDL RATIO
Cholesterol, Total: 133 mg/dL (ref 100–199)
HDL: 44 mg/dL (ref >39)
LDL Calculated: 71 mg/dL (ref 0–99)
LDl/HDL Ratio: 1.6 ratio (ref 0.0–3.6)
TRIGLYCERIDES: 92 mg/dL (ref 0–149)
VLDL CHOLESTEROL CAL: 18 mg/dL (ref 5–40)

## 2018-02-23 ENCOUNTER — Ambulatory Visit: Payer: BLUE CROSS/BLUE SHIELD

## 2018-02-26 ENCOUNTER — Ambulatory Visit
Admission: RE | Admit: 2018-02-26 | Discharge: 2018-02-26 | Disposition: A | Discharge status: Home / Self Care | Payer: BLUE CROSS/BLUE SHIELD | Source: Ambulatory Visit | Attending: Gastroenterology | Admitting: Gastroenterology

## 2018-02-26 DIAGNOSIS — K7460 Unspecified cirrhosis of liver: Secondary | ICD-10-CM | POA: Insufficient documentation

## 2018-02-28 ENCOUNTER — Encounter: Payer: Self-pay | Admitting: Gastroenterology

## 2018-03-10 ENCOUNTER — Encounter: Payer: Self-pay | Admitting: Internal Medicine

## 2018-03-15 ENCOUNTER — Encounter: Payer: Self-pay | Admitting: Internal Medicine

## 2018-03-16 ENCOUNTER — Inpatient Hospital Stay: Payer: BLUE CROSS/BLUE SHIELD | Attending: Internal Medicine | Admitting: Internal Medicine

## 2018-03-16 ENCOUNTER — Encounter: Payer: Self-pay | Admitting: Internal Medicine

## 2018-03-16 VITALS — BP 129/79 | HR 69 | Temp 97.9°F | Resp 16 | Wt 195.5 lb

## 2018-03-16 DIAGNOSIS — E7800 Pure hypercholesterolemia, unspecified: Secondary | ICD-10-CM | POA: Diagnosis not present

## 2018-03-16 DIAGNOSIS — D696 Thrombocytopenia, unspecified: Secondary | ICD-10-CM | POA: Diagnosis not present

## 2018-03-16 DIAGNOSIS — E039 Hypothyroidism, unspecified: Secondary | ICD-10-CM | POA: Diagnosis not present

## 2018-03-16 DIAGNOSIS — I10 Essential (primary) hypertension: Secondary | ICD-10-CM | POA: Diagnosis not present

## 2018-03-16 NOTE — Progress Notes (Signed)
[place] NOTE  Patient Care Team: Karen Kitchens, MD as PCP - General (Family Medicine)  CHIEF COMPLAINTS/PURPOSE OF CONSULTATION: Thrombocytopenia  # THROMBOCYTOPENIA [97-108; incidental]; Hep B/hepC/HIV-NEG [[facility]] sec to cirrhosis/ splenomegaly  # Cirrhosis/ SPLENOMEGALY[US- 1000cc; CT A/P march 2019] s/p EGD/colo [may 2019- Dr.Anna]  # Peyronie disease-    No history exists.     HISTORY OF PRESENTING ILLNESS:  Ryan Willis. 57 y.o.  male cirrhosis/splenomegaly and thrombocytopenia is here for follow-up.  Patient has been recently evaluated by GI-for his cirrhosis.  He is currently compensated.  Patient has been eating diligently trying to lose weight.  Denies any swelling in the legs.  Notes to have improving energy levels.  Denies any swelling in the legs.  Denies any easy bruising or bleeding gums or nose.   Review of Systems  Constitutional: Positive for weight loss. Negative for chills, diaphoresis, fever and malaise/fatigue.  HENT: Negative for nosebleeds and sore throat.   Eyes: Negative for double vision.  Respiratory: Negative for cough, hemoptysis, sputum production, shortness of breath and wheezing.   Cardiovascular: Negative for chest pain, palpitations, orthopnea and leg swelling.  Gastrointestinal: Negative for abdominal pain, blood in stool, constipation, diarrhea, heartburn, melena, nausea and vomiting.  Genitourinary: Negative for dysuria, frequency and urgency.  Musculoskeletal: Negative for back pain and joint pain.  Skin: Negative.  Negative for itching and rash.  Neurological: Negative for dizziness, tingling, focal weakness, weakness and headaches.  Endo/Heme/Allergies: Does not bruise/bleed easily.  Psychiatric/Behavioral: Negative for depression. The patient is not nervous/anxious and does not have insomnia.     MEDICAL HISTORY:  Past Medical History:  Diagnosis Date  . Diverticulitis   . History of kidney  stones   . Hypercholesteremia   . Hypothyroidism   . Peyronie's disease   . Renal cyst, left   . Seasonal allergies   . Thrombocytopenia ([place])     SURGICAL HISTORY: Past Surgical History:  Procedure Laterality Date  . COLONOSCOPY  2006  . COLONOSCOPY WITH PROPOFOL N/A 09/08/2017   Procedure: COLONOSCOPY WITH PROPOFOL;  Surgeon: [name], MD;  Location: [facility] ENDOSCOPY;  Service: Gastroenterology;  Laterality: N/A;  . COLONOSCOPY WITH PROPOFOL N/A 01/01/2018   Procedure: COLONOSCOPY WITH PROPOFOL;  Surgeon: [name], MD;  Location: [facility] ENDOSCOPY;  Service: Gastroenterology;  Laterality: N/A;  . ESOPHAGOGASTRODUODENOSCOPY (EGD) WITH PROPOFOL N/A 09/08/2017   Procedure: ESOPHAGOGASTRODUODENOSCOPY (EGD) WITH PROPOFOL;  Surgeon: [name], MD;  Location: [facility] ENDOSCOPY;  Service: Gastroenterology;  Laterality: N/A;  . HERNIA REPAIR  1981  . SHOULDER SURGERY Right 2001    SOCIAL HISTORY:  Social History   Socioeconomic History  . Marital status: Married    Spouse name: Not on file  . Number of children: Not on file  . Years of education: Not on file  . Highest education level: Not on file  Occupational History  . Not on file  Social Needs  . Financial resource strain: Not on file  . Food insecurity:    Worry: Not on file    Inability: Not on file  . Transportation needs:    Medical: Not on file    Non-medical: Not on file  Tobacco Use  . Smoking status: Never Smoker  . Smokeless tobacco: Never Used  Substance and Sexual Activity  . Alcohol use: Not Currently    Comment: "1 or 2 year drinks a year."  . Drug use: No  . Sexual activity: Not on file  Lifestyle  . Physical  activity:    Days per week: Not on file    Minutes per session: Not on file  . Stress: Not on file  Relationships  . Social connections:    Talks on phone: Not on file    Gets together: Not on file    Attends religious service: Not on file    Active member of club or organization: Not on file     Attends meetings of clubs or organizations: Not on file    Relationship status: Not on file  . Intimate partner violence:    Fear of current or ex partner: Not on file    Emotionally abused: Not on file    Physically abused: Not on file    Forced sexual activity: Not on file  Other Topics Concern  . Not on file  Social History Narrative   retd. Engineer, structural; part time information specialist; never smoked; no alcohol; live in [place]. Lives with wife.     FAMILY HISTORY: colon father- 8 year; mother- kidney cancer; died of lung cancer [2018] Family History  Problem Relation Age of Onset  . Kidney cancer Mother   . Colon cancer Father     ALLERGIES:  has No Known Allergies.  MEDICATIONS:  No current outpatient medications on file.   No current facility-administered medications for this visit.       Marland Kitchen  PHYSICAL EXAMINATION: ECOG PERFORMANCE STATUS: 0 - Asymptomatic  Vitals:   03/16/18 0839  BP: 129/79  Pulse: 69  Resp: 16  Temp: 97.9 F (36.6 C)   Filed Weights   03/16/18 0839  Weight: 195 lb 8.8 oz (88.7 kg)   Physical Exam  Constitutional: He is oriented to person, place, and time and well-developed, well-nourished, and in no distress.  HENT:  Head: Normocephalic and atraumatic.  Mouth/Throat: Oropharynx is clear and moist. No oropharyngeal exudate.  Eyes: Pupils are equal, round, and reactive to light.  Neck: Normal range of motion. Neck supple.  Cardiovascular: Normal rate and regular rhythm.  Pulmonary/Chest: No respiratory distress. He has no wheezes.  Abdominal: Soft. Bowel sounds are normal. He exhibits no distension and no mass. There is no tenderness. There is no rebound and no guarding.  Musculoskeletal: Normal range of motion. He exhibits no edema or tenderness.  Neurological: He is alert and oriented to person, place, and time.  Skin: Skin is warm.  Psychiatric: Affect normal.     LABORATORY DATA:  I have reviewed the data as listed Lab  Results  Component Value Date   WBC 3.9 02/17/2018   HGB 14.9 02/17/2018   HCT 42.8 02/17/2018   MCV 98 (H) 02/17/2018   PLT 92 (LL) 02/17/2018   Recent Labs    08/24/17 0923 02/17/18 0858  NA 141 142  K 4.5 4.4  CL 107* 103  CO2 22 26  GLUCOSE 85 90  BUN 16 14  CREATININE 0.86 0.92  CALCIUM 9.2 9.7  GFRNONAA 96 92  GFRAA 111 106  PROT 7.0 7.1  ALBUMIN 4.2 4.6  AST 49* 37  ALT 36 31  ALKPHOS 99 90  BILITOT 0.7 1.2    RADIOGRAPHIC STUDIES: I have personally reviewed the radiological images as listed and agreed with the findings in the report. US Abdomen Limited Ruq  Result Date: 02/26/2018 CLINICAL DATA:  Hepatic cirrhosis EXAM: ULTRASOUND ABDOMEN LIMITED RIGHT UPPER QUADRANT COMPARISON:  CT abdomen and pelvis July 15, 2017 FINDINGS: Gallbladder: Within the gallbladder, there is a 7 mm echogenic focus which shadows  but is not seen to move, a likely small adherent gallstone. There is no appreciable gallbladder wall thickening or pericholecystic fluid. No sonographic Murphy sign noted by sonographer. Common bile duct: Diameter: 3 mm. No intrahepatic or extrahepatic biliary duct dilatation. Liver: No focal lesion identified. Liver as a rather subtly nodular contour with overall increase in liver echogenicity. Portal vein is patent on color Doppler imaging with normal direction of blood flow towards the liver. IMPRESSION: 1. 7 mm probable adherent gallstone. Gallbladder otherwise appears unremarkable. 2. Liver with a subtly nodular contour increased echogenicity, findings felt to be indicative of a degree of hepatic cirrhosis. While no focal liver lesions are evident on this study, it must be cautioned that the sensitivity of ultrasound for detection of focal liver lesions is somewhat diminished in this circumstance. Electronically Signed   By: Lowella Grip III M.D.   On: 02/26/2018 10:23    IMPRESSION: 1. Findings are consistent with hepatic cirrhosis and portal hypertension  with splenomegaly, a recanalized left paraumbilical vein and small distal esophageal varices. 2. Nonobstructing bilateral renal calculi. No evidence of ureteral calculus or hydronephrosis. 3. Cholelithiasis. 4. Enlarging hiatal hernia containing fat and a small amount of fluid. 5. Distal colonic diverticulosis.   Electronically Signed   By: Richardean Sale M.D.   On: 07/15/2017 15:53   ASSESSMENT & PLAN:   Thrombocytopenia ([place]) # Thrombocytopenia/splenomegaly-Hypersplenism- [IBBCWUG 2019 platelets- between 106-97] Today platelets are 95.  STABLE.  #Cirrhosis/splenomegaly-compensated; defer to further management to GI.  Discussed my concerns for development of HCC-although risk is small.  October 2019- US-[Dr.Anna] liver -NO focal lesions/cirrhotic changes. Defer to GI re: [place] screening.   # Peyronie's disease stable; defer to urology for further work-up/management. STABLE.   # DISPOSITION:  # follow up in 6 months/labs-cbc/cmp- AFP [[facility]]- Dr.B   All questions were answered. The patient knows to call the clinic with any problems, questions or concerns.    Cammie Sickle, MD 03/16/2018 12:35 PM

## 2018-03-16 NOTE — Assessment & Plan Note (Addendum)
#   Thrombocytopenia/splenomegaly-Hypersplenism- [January 2019 platelets- between 106-97] Today platelets are 95.  STABLE.  #Cirrhosis/splenomegaly-compensated; defer to further management to GI.  Discussed my concerns for development of HCC-although risk is small.  October 2019- US-[Dr.Anna] liver -NO focal lesions/cirrhotic changes. Defer to GI re: [place] screening.   # Peyronie's disease stable; defer to urology for further work-up/management. STABLE.   # DISPOSITION:  # follow up in 6 months/labs-cbc/cmp- AFP [[facility]]- Dr.B

## 2018-09-07 ENCOUNTER — Encounter: Payer: Self-pay | Admitting: Internal Medicine

## 2018-09-08 ENCOUNTER — Telehealth: Payer: Self-pay | Admitting: *Deleted

## 2018-09-08 NOTE — Telephone Encounter (Signed)
Left vm to see if patient would be willing to complete a virtual visit next week for the 5/19 apt.

## 2018-09-13 ENCOUNTER — Telehealth: Payer: Self-pay | Admitting: Internal Medicine

## 2018-09-14 ENCOUNTER — Encounter: Payer: Self-pay | Admitting: Internal Medicine

## 2018-09-14 ENCOUNTER — Inpatient Hospital Stay: Payer: BLUE CROSS/BLUE SHIELD | Attending: Internal Medicine | Admitting: Internal Medicine

## 2018-09-14 DIAGNOSIS — R772 Abnormality of alphafetoprotein: Secondary | ICD-10-CM | POA: Diagnosis not present

## 2018-09-14 DIAGNOSIS — D696 Thrombocytopenia, unspecified: Secondary | ICD-10-CM | POA: Diagnosis not present

## 2018-09-14 DIAGNOSIS — D731 Hypersplenism: Secondary | ICD-10-CM | POA: Diagnosis not present

## 2018-09-14 DIAGNOSIS — K7460 Unspecified cirrhosis of liver: Secondary | ICD-10-CM | POA: Diagnosis not present

## 2018-09-14 DIAGNOSIS — R161 Splenomegaly, not elsewhere classified: Secondary | ICD-10-CM

## 2018-09-14 NOTE — Assessment & Plan Note (Signed)
#   Thrombocytopenia/splenomegaly-Hypersplenism- [January 2019 platelets- between 106-97] Today platelets are 91.  Stable.  Discussed again bleeding diathesis from very low platelets/decompensated liver disease.  #Cirrhosis/splenomegaly-HCC screening-compensated-followed by Dr. Vicente Males.  Ultrasound 2019 November-cirrhosis/no obvious liver lesions noted.  May 2020-AFP elevated at 11.7.  Recommend MRI of the liver.  Discussed with Dr. Vicente Males.  # Peyronie's disease stable; defer to urology for further work-up/management.  Stable.  # DISPOSITION:  # MRI of liver in 1 week- will call with results.  # follow up in 6 months; MD, labs -cbc/cmp/afp [[facility]]; - Dr.B

## 2018-09-14 NOTE — Progress Notes (Signed)
I connected with Ryan Willis. on 09/14/18 at  9:00 AM EDT by video enabled telemedicine visit and verified that I am speaking with the correct person using two identifiers.  I discussed the limitations, risks, security and privacy concerns of performing an evaluation and management service by telemedicine and the availability of in-person appointments. I also discussed with the patient that there may be a patient responsible charge related to this service. The patient expressed understanding and agreed to proceed.    Other persons participating in the visit and their role in the encounter: none  Patient's location: home  Provider's location: home    No history exists.     Chief Complaint: Cirrhosis/thrombocytopenia   History of present illness:Beryle Caryl Willis. 58 y.o.  male with history of cirrhosis/hypersplenism and also history of thrombocytopenia is here for follow-up.  Patient denies any swelling in the legs.  Denies any blood in stools or black or stools.  Appetite is fair.  No abdominal distention no loss of appetite.  He has a good appetite he is gaining weight.  Observation/objective:  Assessment and plan: Thrombocytopenia ([place]) # Thrombocytopenia/splenomegaly-Hypersplenism- [KLTYVDP 2019 platelets- between 106-97] Today platelets are 91.  Stable.  Discussed again bleeding diathesis from very low platelets/decompensated liver disease.  #Cirrhosis/splenomegaly-HCC screening-compensated-followed by Dr. Vicente Males.  Ultrasound 2019 November-cirrhosis/no obvious liver lesions noted.  May 2020-AFP elevated at 11.7.  Recommend MRI of the liver.  Discussed with Dr. Vicente Males.  # Peyronie's disease stable; defer to urology for further work-up/management.  Stable.  # DISPOSITION:  # MRI of liver in 1 week- will call with results.  # follow up in 6 months; MD, labs -cbc/cmp/afp [[facility]]; - Dr.B   Follow-up instructions:  I discussed the assessment and treatment plan with the patient.  The  patient was provided an opportunity to ask questions and all were answered.  The patient agreed with the plan and demonstrated understanding of instructions.  The patient was advised to call back or seek an in person evaluation if the symptoms worsen or if the condition fails to improve as anticipated.  I provided 25  minutes of face-to-face video visit time during this encounter, and > 50% was spent counseling as documented under my assessment & plan.   Dr. Charlaine Dalton [facility] at [facility] 09/14/2018 12:55 PM

## 2018-09-23 ENCOUNTER — Ambulatory Visit
Admission: RE | Admit: 2018-09-23 | Discharge: 2018-09-23 | Disposition: A | Discharge status: Home / Self Care | Payer: BLUE CROSS/BLUE SHIELD | Source: Ambulatory Visit | Attending: Internal Medicine | Admitting: Internal Medicine

## 2018-09-23 ENCOUNTER — Other Ambulatory Visit: Payer: Self-pay

## 2018-09-23 DIAGNOSIS — K7460 Unspecified cirrhosis of liver: Secondary | ICD-10-CM | POA: Diagnosis present

## 2018-09-23 DIAGNOSIS — R772 Abnormality of alphafetoprotein: Secondary | ICD-10-CM | POA: Diagnosis not present

## 2018-09-23 MED ORDER — GADOBUTROL 1 MMOL/ML IV SOLN
9.0000 mL | Freq: Once | INTRAVENOUS | Status: AC | PRN
  Administered 2018-09-23: 18:00:00 9 mL via INTRAVENOUS

## 2018-09-27 ENCOUNTER — Ambulatory Visit (INDEPENDENT_AMBULATORY_CARE_PROVIDER_SITE_OTHER): Payer: BLUE CROSS/BLUE SHIELD | Admitting: Gastroenterology

## 2018-09-27 ENCOUNTER — Telehealth: Payer: Self-pay

## 2018-09-27 DIAGNOSIS — K7460 Unspecified cirrhosis of liver: Secondary | ICD-10-CM | POA: Diagnosis not present

## 2018-09-27 NOTE — Telephone Encounter (Signed)
Called pt to pre-chart for today's e-visit with Dr. Anna  Unable to contact. LVM to return call 

## 2018-09-27 NOTE — Progress Notes (Signed)
Ryan Willis , MD [address]  [place]  [place], [place] [address]  Main: [phone]  Fax: [phone]   Primary Care Physician: Patient, No Pcp Per  Virtual Visit via Video Note  I connected with patient on 09/27/18 at 10:00 AM EDT by video and verified that I am speaking with the correct person using two identifiers.   I discussed the limitations, risks, security and privacy concerns of performing an evaluation and management service by video  and the availability of in person appointments. I also discussed with the patient that there may be a patient responsible charge related to this service. The patient expressed understanding and agreed to proceed.  Location of Patient: Home Location of Provider: Home Persons involved: Patient and provider only   History of Present Illness: Chief Complaint  Patient presents with  . Follow-up    Cirrhosis of Liver    HPI: Ryan Willis. is a 58 y.o. male  Summary of history :  He was last seen on 10/19 for liver cirrhosis likely secondary to NAFLD.RUQ USG in 06/2017 showed a normal liver, splenomegaly andCholelithiasis with mild gallbladder wall thickening. The patient had a CT scan on 07/15/17 which showed features of cirrhosis and portal hypertension withwith splenomegaly, a recanalized left paraumbilical vein and small distal esophageal varices.His dad had colon cancer that went into the liver. 01/01/18 : colonoscopy- 3 mm tubular adenoma resected 09/08/17- EGD: No varicesantral gastropathy on bx.  Labs 09/14/17 : Normal 24 hour urinary copper 08/24/17 : Hep B surface ab- positive, ANA ,SM ab,LKM, immunoglubulins ,celiac serology,A1AT,HIV,Hep C ab,Hep B cv ab , Hep B e ab,HbsAg, , - Normal/Negative. Hb 15 Plt 95, ferritin 188. Immune to Hep A.INR 1.2 ,Cr 0.86  Ceruloplasmin low 15.2.    Interval history10/23/19 -09/27/2018  09/23/2018: MRI- no HCC 09/03/2018: Labs : HB normal, thrombocytopenia seen. CMP- albumin > 4  , Tbil 1.7. No INR  Since last visit - diet went bad due to COVID- is 182 lbs On a low salt diet .   He did have eye exam and was told is normal.  Sleeps well. Normal bowel movements . No constipation . Denies any confusion.    No current outpatient medications on file.   No current facility-administered medications for this visit.     Allergies as of 09/27/2018  . (No Known Allergies)    Review of Systems:    All systems reviewed and negative except where noted in HPI.  General Appearance:    Alert, cooperative, no distress, appears stated age  Head:    Normocephalic, without obvious abnormality, atraumatic  Eyes:    PERRL, conjunctiva/corneas clear,  Ears:    Grossly normal hearing    Neurologic:  Grossly normal    Observations/Objective:  Labs: CMP     Component Value Date/Time   NA 142 02/17/2018 0858   K 4.4 02/17/2018 0858   CL 103 02/17/2018 0858   CO2 26 02/17/2018 0858   GLUCOSE 90 02/17/2018 0858   BUN 14 02/17/2018 0858   CREATININE 0.92 02/17/2018 0858   CALCIUM 9.7 02/17/2018 0858   PROT 7.1 02/17/2018 0858   ALBUMIN 4.6 02/17/2018 0858   AST 37 02/17/2018 0858   ALT 31 02/17/2018 0858   ALKPHOS 90 02/17/2018 0858   BILITOT 1.2 02/17/2018 0858   GFRNONAA 92 02/17/2018 0858   GFRAA 106 02/17/2018 0858   Lab Results  Component Value Date   WBC 3.9 02/17/2018   HGB 14.9 02/17/2018  HCT 42.8 02/17/2018   MCV 98 (H) 02/17/2018   PLT 92 (LL) 02/17/2018    Imaging Studies: Mr Liver W Wo Contrast  Result Date: 09/24/2018 CLINICAL DATA:  58 year old male with history of cirrhosis. Low platelet count. EXAM: MRI ABDOMEN WITHOUT AND WITH CONTRAST TECHNIQUE: Multiplanar multisequence MR imaging of the abdomen was performed both before and after the administration of intravenous contrast. CONTRAST:  9 mL of Gadavist. COMPARISON:  No prior abdominal MRI. CT the abdomen and pelvis 07/15/2017. FINDINGS: Lower chest: Distal paraesophageal varices.  Hepatobiliary: Liver has a shrunken appearance and nodular contour, indicative of advanced cirrhosis. No discrete cystic or solid hepatic lesions are noted. No intra or extrahepatic biliary ductal dilatation. Small filling defect lying dependently in the gallbladder, compatible with a tiny gallstone. Gallbladder is not distended, and there are no surrounding inflammatory changes to suggest an acute cholecystitis at this time. Common bile duct is normal in caliber measuring 3 mm in the porta hepatis. Pancreas: No pancreatic mass. No pancreatic ductal dilatation. No pancreatic or peripancreatic fluid or inflammatory changes. Spleen: Spleen is enlarged measuring 15.6 x 7.5 x 19.4 cm (estimated splenic volume of 1,135 mL) . Adrenals/Urinary Tract: Exophytic T1 hypointense, T2 hyperintense, nonenhancing lesion in the lower pole of the left kidney is compatible with a large simple cyst. Other tiny subcentimeter simple cysts are also noted in the kidneys bilaterally. No suspicious renal lesions. No hydroureteronephrosis in the visualized portions of the abdomen. Bilateral adrenal glands are normal in appearance. Stomach/Bowel: Visualized portions are unremarkable. Vascular/Lymphatic: No aneurysm identified in the visualized abdominal vasculature. Portal vein is patent but mildly enlarged measuring 16 mm in the porta hepatis. Portosystemic collateral pathways, including distal paraesophageal varices. No lymphadenopathy noted in the abdomen. Other: No significant volume of ascites noted in the visualized portions of the peritoneal cavity. Musculoskeletal: No aggressive appearing osseous lesions are noted in the visualized portions of the skeleton. IMPRESSION: 1. Morphologic changes in the liver of advanced cirrhosis. There is also evidence of associated portal venous hypertension, including dilated portal vein, splenomegaly, and portosystemic collateral pathways, including dilated paraesophageal varices. The 2.  Cholelithiasis without evidence of acute cholecystitis at this time. Electronically Signed   By: Vinnie Langton M.D.   On: 09/24/2018 08:00    Assessment and Plan:   [name] is a 58 y.o. y/o male here to follow up forfor liver cirrhosis with portal hypertension  .Childs A. Etiology of liver disease likely NAFLD.Immune to Hep A/B Doing well and losing weight Labs appear stable. No hepatic encephelopathy. Doing well.  Plan  1.Vaccination including , pneumococcal, Tdap  2.EGD to screen for esophagealvarices in 08/2020  3.Abdominal ultrasound to screen for [facility] 03/2019  4.Continue life style changes for NAFLD, avoid alcohol     I discussed the assessment and treatment plan with the patient. The patient was provided an opportunity to ask questions and all were answered. The patient agreed with the plan and demonstrated an understanding of the instructions.   The patient was advised to call back or seek an in-person evaluation if the symptoms worsen or if the condition fails to improve as anticipated.   Dr Ryan Bellows MD,MRCP [facility]) Gastroenterology/Hepatology Pager: [phone]   Speech recognition software was used to dictate this note.

## 2019-01-12 ENCOUNTER — Other Ambulatory Visit: Payer: Self-pay

## 2019-01-12 ENCOUNTER — Emergency Department: Payer: 59

## 2019-01-12 ENCOUNTER — Emergency Department
Admission: EM | Admit: 2019-01-12 | Discharge: 2019-01-13 | Disposition: A | Discharge status: Home / Self Care | Payer: 59 | Attending: Student | Admitting: Student

## 2019-01-12 ENCOUNTER — Encounter: Payer: Self-pay | Admitting: Emergency Medicine

## 2019-01-12 DIAGNOSIS — K5730 Diverticulosis of large intestine without perforation or abscess without bleeding: Secondary | ICD-10-CM | POA: Diagnosis not present

## 2019-01-12 DIAGNOSIS — E039 Hypothyroidism, unspecified: Secondary | ICD-10-CM | POA: Insufficient documentation

## 2019-01-12 DIAGNOSIS — R109 Unspecified abdominal pain: Secondary | ICD-10-CM | POA: Diagnosis present

## 2019-01-12 DIAGNOSIS — N200 Calculus of kidney: Secondary | ICD-10-CM

## 2019-01-12 DIAGNOSIS — N130 Hydronephrosis with ureteropelvic junction obstruction: Secondary | ICD-10-CM | POA: Diagnosis not present

## 2019-01-12 LAB — CBC
HCT: 43 % (ref 39.0–52.0)
Hemoglobin: 15 g/dL (ref 13.0–17.0)
MCH: 33.3 pg (ref 26.0–34.0)
MCHC: 34.9 g/dL (ref 30.0–36.0)
MCV: 95.6 fL (ref 80.0–100.0)
Platelets: 75 10*3/uL — ABNORMAL LOW (ref 150–400)
RBC: 4.5 MIL/uL (ref 4.22–5.81)
RDW: 12.7 % (ref 11.5–15.5)
WBC: 4.3 10*3/uL (ref 4.0–10.5)
nRBC: 0 % (ref 0.0–0.2)

## 2019-01-12 LAB — URINALYSIS, COMPLETE (UACMP) WITH MICROSCOPIC
Bacteria, UA: NONE SEEN
Bilirubin Urine: NEGATIVE
Glucose, UA: NEGATIVE mg/dL
Ketones, ur: 5 mg/dL — AB
Leukocytes,Ua: NEGATIVE
Nitrite: NEGATIVE
Protein, ur: NEGATIVE mg/dL
RBC / HPF: >50 RBC/hpf — ABNORMAL HIGH (ref 0–5)
Specific Gravity, Urine: >1.046 — ABNORMAL HIGH (ref 1.005–1.030)
Squamous Epithelial / HPF: NONE SEEN (ref 0–5)
pH: 7 (ref 5.0–8.0)

## 2019-01-12 LAB — COMPREHENSIVE METABOLIC PANEL
ALT: 32 U/L (ref 0–44)
AST: 38 U/L (ref 15–41)
Albumin: 4.4 g/dL (ref 3.5–5.0)
Alkaline Phosphatase: 78 U/L (ref 38–126)
Anion gap: 9 (ref 5–15)
BUN: 14 mg/dL (ref 6–20)
CO2: 25 mmol/L (ref 22–32)
Calcium: 9.8 mg/dL (ref 8.9–10.3)
Chloride: 107 mmol/L (ref 98–111)
Creatinine, Ser: 0.98 mg/dL (ref 0.61–1.24)
GFR calc Af Amer: >60 mL/min (ref >60)
GFR calc non Af Amer: >60 mL/min (ref >60)
Glucose, Bld: 123 mg/dL — ABNORMAL HIGH (ref 70–99)
Potassium: 4.3 mmol/L (ref 3.5–5.1)
Sodium: 141 mmol/L (ref 135–145)
Total Bilirubin: 2.2 mg/dL — ABNORMAL HIGH (ref 0.3–1.2)
Total Protein: 7.2 g/dL (ref 6.5–8.1)

## 2019-01-12 LAB — LIPASE, BLOOD: Lipase: 29 U/L (ref 11–51)

## 2019-01-12 MED ORDER — OXYCODONE-ACETAMINOPHEN 5-325 MG PO TABS
2.0000 | ORAL_TABLET | Freq: Once | ORAL | Status: AC
  Administered 2019-01-13: 2 via ORAL
  Filled 2019-01-12: qty 2

## 2019-01-12 MED ORDER — KETOROLAC TROMETHAMINE 30 MG/ML IJ SOLN
INTRAMUSCULAR | Status: AC
  Administered 2019-01-12: 30 mg via INTRAVENOUS
  Filled 2019-01-12: qty 1

## 2019-01-12 MED ORDER — FENTANYL CITRATE (PF) 100 MCG/2ML IJ SOLN
50.0000 ug | INTRAMUSCULAR | Status: DC | PRN
  Administered 2019-01-12: 50 ug via INTRAVENOUS
  Filled 2019-01-12: qty 2

## 2019-01-12 MED ORDER — IOHEXOL 9 MG/ML PO SOLN
500.0000 mL | ORAL | Status: AC
  Administered 2019-01-12: 500 mL via ORAL

## 2019-01-12 MED ORDER — PROMETHAZINE HCL 25 MG/ML IJ SOLN
12.5000 mg | Freq: Once | INTRAMUSCULAR | Status: AC
  Administered 2019-01-12: 12.5 mg via INTRAVENOUS
  Filled 2019-01-12: qty 1

## 2019-01-12 MED ORDER — KETOROLAC TROMETHAMINE 30 MG/ML IJ SOLN
30.0000 mg | Freq: Once | INTRAMUSCULAR | Status: AC
  Administered 2019-01-12: 21:00:00 30 mg via INTRAVENOUS
  Filled 2019-01-12: qty 1

## 2019-01-12 MED ORDER — ONDANSETRON HCL 4 MG/2ML IJ SOLN
4.0000 mg | Freq: Once | INTRAMUSCULAR | Status: AC
  Administered 2019-01-12: 4 mg via INTRAVENOUS
  Filled 2019-01-12: qty 2

## 2019-01-12 MED ORDER — IOHEXOL 300 MG/ML  SOLN
100.0000 mL | Freq: Once | INTRAMUSCULAR | Status: AC | PRN
  Administered 2019-01-12: 100 mL via INTRAVENOUS

## 2019-01-12 MED ORDER — SODIUM CHLORIDE 0.9% FLUSH
3.0000 mL | Freq: Once | INTRAVENOUS | Status: AC
  Administered 2019-01-12: 3 mL via INTRAVENOUS

## 2019-01-12 MED ORDER — OXYCODONE-ACETAMINOPHEN 5-325 MG PO TABS: 1.0000 | ORAL_TABLET | ORAL | 0 refills | Status: DC | PRN

## 2019-01-12 MED ORDER — MORPHINE SULFATE (PF) 4 MG/ML IV SOLN
4.0000 mg | Freq: Once | INTRAVENOUS | Status: AC
  Administered 2019-01-12: 4 mg via INTRAVENOUS
  Filled 2019-01-12: qty 1

## 2019-01-12 NOTE — ED Notes (Signed)
This RN introduced self to pt. Pt states pain was coming back and he was getting nauseas. This RN and Annie Main, RN requested pain medicine and something for nausea from the EDP.

## 2019-01-12 NOTE — ED Provider Notes (Signed)
[facility] Emergency Department Provider Note  ____________________________________________   First MD Initiated Contact with Patient 01/12/19 1857     (approximate)  I have reviewed the triage vital signs and the nursing notes.  History  Chief Complaint Abdominal Pain    HPI Ryan Willis. is a 58 y.o. male with history of diverticulitis, nephrolithiasis, hyperlipidemia, cirrhosis who presents the emergency department for acute onset of severe right-sided abdominal pain.  Symptoms started this afternoon.  Pain is sharp, intermittent and colicky nature.  He localizes it to the right flank/side, and radiates to the right lower quadrant and into his groin.  He denies any testicular swelling or erythema.  Associated with nausea and vomiting.  No fevers.  He denies any dysuria or frank hematuria.  He states this feels like prior episodes of nephrolithiasis.  He has not required lithotripsy or stenting in the past.  He denies any diarrhea.         Past Medical Hx Past Medical History:  Diagnosis Date  . Diverticulitis   . History of kidney stones   . Hypercholesteremia   . Hypothyroidism   . Peyronie's disease   . Renal cyst, left   . Seasonal allergies   . Thrombocytopenia [facility])     Problem List Patient Active Problem List   Diagnosis Date Noted  . Splenomegaly 07/07/2017  . Thrombocytopenia ([place]) 06/23/2017  . Cirrhosis ([place]) 06/23/2017    Past Surgical Hx Past Surgical History:  Procedure Laterality Date  . COLONOSCOPY  2006  . COLONOSCOPY WITH PROPOFOL N/A 09/08/2017   Procedure: COLONOSCOPY WITH PROPOFOL;  Surgeon: [name], MD;  Location: [facility] ENDOSCOPY;  Service: Gastroenterology;  Laterality: N/A;  . COLONOSCOPY WITH PROPOFOL N/A 01/01/2018   Procedure: COLONOSCOPY WITH PROPOFOL;  Surgeon: [name], MD;  Location: [facility] ENDOSCOPY;  Service: Gastroenterology;  Laterality: N/A;  . ESOPHAGOGASTRODUODENOSCOPY (EGD) WITH PROPOFOL N/A  09/08/2017   Procedure: ESOPHAGOGASTRODUODENOSCOPY (EGD) WITH PROPOFOL;  Surgeon: [name], MD;  Location: [facility] ENDOSCOPY;  Service: Gastroenterology;  Laterality: N/A;  . HERNIA REPAIR  1981  . SHOULDER SURGERY Right 2001    Medications Prior to Admission medications   Not on File    Allergies Patient has no known allergies.  Family Hx Family History  Problem Relation Age of Onset  . Kidney cancer Mother   . Colon cancer Father     Social Hx Social History   Tobacco Use  . Smoking status: Never Smoker  . Smokeless tobacco: Never Used  Substance Use Topics  . Alcohol use: Not Currently    Comment: "1 or 2 year drinks a year."  . Drug use: No     Review of Systems  Constitutional: Negative for fever. Negative for chills. Eyes: Negative for visual changes. ENT: Negative for sore throat. Cardiovascular: Negative for chest pain. Respiratory: Negative for shortness of breath. Gastrointestinal: + R flank pain, abdominal pain, nausea, vomiting Genitourinary: Negative for dysuria. Musculoskeletal: Negative for leg swelling. Skin: Negative for rash. Neurological: Negative for for headaches.   Physical Exam  Vital Signs: ED Triage Vitals  Enc Vitals Group     BP 01/12/19 1834 (!) 144/69     Pulse Rate 01/12/19 1834 67     Resp 01/12/19 1834 18     Temp 01/12/19 1834 98.6 F (37 C)     Temp Source 01/12/19 1834 Oral     SpO2 01/12/19 1834 96 %     Weight 01/12/19 1831 195 lb 8.8 oz (88.7  kg)     Height --      Head Circumference --      Peak Flow --      Pain Score 01/12/19 1831 8     Pain Loc --      Pain Edu? --      Excl. in [place]? --     Constitutional: Alert and oriented.  Appears uncomfortable. Eyes: Conjunctivae clear. Sclera anicteric. Head: Normocephalic. Atraumatic. Nose: No congestion. No rhinorrhea. Mouth/Throat: Mucous membranes are moist.  Neck: No stridor.   Cardiovascular: Normal rate, regular rhythm.  Extremities well perfused.  Respiratory: Normal respiratory effort.  Lungs CTAB. Gastrointestinal: Soft, right mid and right lower quadrant abdominal tenderness.  No rebound or guarding.  Remainder of abdomen is soft and nontender. GU: No scrotal erythema, no testicular erythema or swelling.  No rashes or lesions. Musculoskeletal: No lower extremity edema. Neurologic:  Normal speech and language. No gross focal neurologic deficits are appreciated.  Skin: Skin is warm, dry and intact. No rash noted. Psychiatric: Mood and affect are appropriate for situation.  EKG  N/A   Radiology  CT: IMPRESSION: 1. Acute obstructive uropathy on the right due to a 10 mm calculus lodged at the right ureteropelvic junction. 2. Bilateral nephrolithiasis, up to 7 mm now on the left. 3. Chronic cirrhosis and portal venous hypertension including splenomegaly, abdominal and paraesophageal varices. 4. Chronic cholelithiasis. 5. Extensive diverticulosis of the descending and sigmoid colon without active inflammation. 6. Small chronic hiatal hernia mostly containing fat.     Procedures  Procedure(s) performed (including critical care):  Procedures   Initial Impression / Assessment and Plan / ED Course  58 y.o. male who presents to the ED for acute onset of right-sided renal colic, concerning for nephrolithiasis.  Labs reveal normal creatinine.  CT imaging reveals a 10 mm calculus at the right UPJ. Feels improved after pain/nausea control. Awaiting urinalysis at this time, then will discuss with urology.     Final Clinical Impression(s) / ED Diagnosis  Final diagnoses:  Nephrolithiasis       Note:  This document was prepared using Dragon voice recognition software and may include unintentional dictation errors.   Lilia Pro., MD 01/12/19 [phone]

## 2019-01-12 NOTE — ED Provider Notes (Signed)
I assumed care of the patient from Dr. Joan Mayans at 11:00 PM with recommendation to follow-up on urinalysis which revealed no evidence of infection.  I evaluated patient who stated that his current pain score is 1 out of 10.  Patient subsequently discussed with Dr. Erlene Quan who will follow up with the patient in clinic tomorrow.  Patient will be given Percocet now prescribed the same for home.   Gregor Hams, MD 01/13/19 0000

## 2019-01-12 NOTE — ED Notes (Signed)
Pt states pain has come back down to a 7/10 after administering phenergan and morphine. VSS. Pt in NAD. Will continue to monitor.

## 2019-01-12 NOTE — ED Triage Notes (Signed)
C?O RLQ and right flank pain today.  Emesis x 2.  States pain is similar to renal colic pain.

## 2019-01-13 ENCOUNTER — Other Ambulatory Visit: Payer: Self-pay | Admitting: Radiology

## 2019-01-13 ENCOUNTER — Encounter: Payer: Self-pay | Admitting: Physician Assistant

## 2019-01-13 ENCOUNTER — Ambulatory Visit (INDEPENDENT_AMBULATORY_CARE_PROVIDER_SITE_OTHER): Payer: 59 | Admitting: Physician Assistant

## 2019-01-13 VITALS — BP 130/75 | HR 77 | Ht 66.0 in | Wt 187.8 lb

## 2019-01-13 DIAGNOSIS — N201 Calculus of ureter: Secondary | ICD-10-CM

## 2019-01-13 NOTE — Progress Notes (Signed)
01/13/2019 3:43 PM   [name]. 06/01/1960 [id number]  CC: 15mm right UPJ stone, RLQ and right flank pain, vomiting  HPI: Ryan Willis. is a 58 y.o. male who presents for management of known 56mm right UPJ stone.   He presented to the ED yesterday with a 1-day history of right flank and RLQ pain with nausea and vomiting. CT stone study revealed 48mm right UPJ stone with associated right hydronephrosis. There was also bilateral nonobstructing nephrolithiasis and a 6-7cm simple left renal cyst, stable from prior imaging. Urinalysis with >50 RBCs/hpf and 6-10 WBCs/hpf. WBC 4.3, creatinine 0.98. Urine culture pending. He was treated with fentanyl, phenergan, ondansetron, keterolac, and morphine in the ED and discharged on Percocet. Last NSAID dose yesterday.  In the interim, he reports improvement in his pain. He denies fevers, chills, and gross hematuria. He is not on anticoagulation, however he does have thrombocytopenia secondary to cirrhosis. Platelets 75 yesterday.  Per CT, stone is measured at up to 1400HU in density. Stone-to-skin distance estimated at approximately 12cm. Stone is visible on CT scout imaging.  PMH: Past Medical History:  Diagnosis Date  . Diverticulitis   . History of kidney stones   . Hypercholesteremia   . Hypothyroidism   . Peyronie's disease   . Renal cyst, left   . Seasonal allergies   . Thrombocytopenia [facility])     Surgical History: Past Surgical History:  Procedure Laterality Date  . COLONOSCOPY  2006  . COLONOSCOPY WITH PROPOFOL N/A 09/08/2017   Procedure: COLONOSCOPY WITH PROPOFOL;  Surgeon: [name], MD;  Location: [facility] ENDOSCOPY;  Service: Gastroenterology;  Laterality: N/A;  . COLONOSCOPY WITH PROPOFOL N/A 01/01/2018   Procedure: COLONOSCOPY WITH PROPOFOL;  Surgeon: [name], MD;  Location: [facility] ENDOSCOPY;  Service: Gastroenterology;  Laterality: N/A;  . ESOPHAGOGASTRODUODENOSCOPY (EGD) WITH PROPOFOL N/A 09/08/2017   Procedure:  ESOPHAGOGASTRODUODENOSCOPY (EGD) WITH PROPOFOL;  Surgeon: [name], MD;  Location: [facility] ENDOSCOPY;  Service: Gastroenterology;  Laterality: N/A;  . HERNIA REPAIR  1981  . SHOULDER SURGERY Right 2001    Home Medications:  Allergies as of 01/13/2019   No Known Allergies     Medication List       Accurate as of January 13, 2019  3:43 PM. If you have any questions, ask your nurse or doctor.        oxyCODONE-acetaminophen 5-325 MG tablet Commonly known as: Percocet Take 1 tablet by mouth every 4 (four) hours as needed.       Allergies: No Known Allergies  Family History: Family History  Problem Relation Age of Onset  . Kidney cancer Mother   . Colon cancer Father     Social History:  reports that he has never smoked. He has never used smokeless tobacco. He reports previous alcohol use. He reports that he does not use drugs.  ROS: UROLOGY Frequent Urination?: No Hard to postpone urination?: No Burning/pain with urination?: No Get up at night to urinate?: No Leakage of urine?: No Urine stream starts and stops?: No Trouble starting stream?: Yes Do you have to strain to urinate?: Yes Blood in urine?: No Urinary tract infection?: No Sexually transmitted disease?: No Injury to kidneys or bladder?: No Painful intercourse?: No Weak stream?: No Erection problems?: No Penile pain?: No  Gastrointestinal Nausea?: Yes Vomiting?: Yes Indigestion/heartburn?: No Diarrhea?: No Constipation?: No  Constitutional Fever: No Night sweats?: No Weight loss?: No Fatigue?: No  Skin Skin rash/lesions?: No Itching?: No  Eyes Blurred vision?: No Double vision?: No  Ears/Nose/Throat Sore throat?: No Sinus problems?: No  Hematologic/Lymphatic Swollen glands?: No Easy bruising?: No  Cardiovascular Leg swelling?: No Chest pain?: No  Respiratory Cough?: No Shortness of breath?: No  Endocrine Excessive thirst?: No  Musculoskeletal Back pain?: No Joint  pain?: No  Neurological Headaches?: No Dizziness?: No  Psychologic Depression?: No Anxiety?: No  Physical Exam: BP 130/75 (BP Location: Left Arm, Patient Position: Sitting, Cuff Size: Normal)   Pulse 77   Ht 5\' 6"  (1.676 m)   Wt 187 lb 12.8 oz (85.2 kg)   BMI 30.31 kg/m   Constitutional:  Alert and oriented, No acute distress. HEENT: [place] AT, moist mucus membranes.  Trachea midline, no masses. Cardiovascular: No clubbing, cyanosis, or edema. Respiratory: Normal respiratory effort, no increased work of breathing. GI: Abdomen is soft, nontender, no ascites Skin: No rashes, bruises or suspicious lesions. Neurologic: Grossly intact, no focal deficits, moving all 4 extremities. Psychiatric: Normal mood and affect.  Laboratory Data: Results for orders placed or performed during the hospital encounter of 01/12/19  Lipase, blood  Result Value Ref Range   Lipase 29 11 - 51 U/L  Comprehensive metabolic panel  Result Value Ref Range   Sodium 141 135 - 145 mmol/L   Potassium 4.3 3.5 - 5.1 mmol/L   Chloride 107 98 - 111 mmol/L   CO2 25 22 - 32 mmol/L   Glucose, Bld 123 (H) 70 - 99 mg/dL   BUN 14 6 - 20 mg/dL   Creatinine, Ser 0.98 0.61 - 1.24 mg/dL   Calcium 9.8 8.9 - 10.3 mg/dL   Total Protein 7.2 6.5 - 8.1 g/dL   Albumin 4.4 3.5 - 5.0 g/dL   AST 38 15 - 41 U/L   ALT 32 0 - 44 U/L   Alkaline Phosphatase 78 38 - 126 U/L   Total Bilirubin 2.2 (H) 0.3 - 1.2 mg/dL   GFR calc non Af Amer >60 >60 mL/min   GFR calc Af Amer >60 >60 mL/min   Anion gap 9 5 - 15  CBC  Result Value Ref Range   WBC 4.3 4.0 - 10.5 K/uL   RBC 4.50 4.22 - 5.81 MIL/uL   Hemoglobin 15.0 13.0 - 17.0 g/dL   HCT 43.0 39.0 - 52.0 %   MCV 95.6 80.0 - 100.0 fL   MCH 33.3 26.0 - 34.0 pg   MCHC 34.9 30.0 - 36.0 g/dL   RDW 12.7 11.5 - 15.5 %   Platelets 75 (L) 150 - 400 K/uL   nRBC 0.0 0.0 - 0.2 %  Urinalysis, Complete w Microscopic  Result Value Ref Range   Color, Urine YELLOW (A) YELLOW   APPearance CLEAR  (A) CLEAR   Specific Gravity, Urine >1.046 (H) 1.005 - 1.030   pH 7.0 5.0 - 8.0   Glucose, UA NEGATIVE NEGATIVE mg/dL   Hgb urine dipstick LARGE (A) NEGATIVE   Bilirubin Urine NEGATIVE NEGATIVE   Ketones, ur 5 (A) NEGATIVE mg/dL   Protein, ur NEGATIVE NEGATIVE mg/dL   Nitrite NEGATIVE NEGATIVE   Leukocytes,Ua NEGATIVE NEGATIVE   RBC / HPF >50 (H) 0 - 5 RBC/hpf   WBC, UA 6-10 0 - 5 WBC/hpf   Bacteria, UA NONE SEEN NONE SEEN   Squamous Epithelial / LPF NONE SEEN 0 - 5   Mucus PRESENT    Pertinent Imaging: CT abdomen pelvis w/ contrast 01/12/2019: CLINICAL DATA:  58 year old male with right side abdominal pain.  EXAM: CT ABDOMEN AND PELVIS WITH CONTRAST  TECHNIQUE: Multidetector CT imaging  of the abdomen and pelvis was performed using the standard protocol following bolus administration of intravenous contrast.  CONTRAST:  158mL OMNIPAQUE IOHEXOL 300 MG/ML  SOLN  COMPARISON:  CT Abdomen 07/15/2017.  FINDINGS: Lower chest: Stable minor lung base atelectasis or scarring. Small fat containing hiatal hernia is unchanged. Cardiac size at the upper limits of normal. No pericardial or pleural effusion.  Hepatobiliary: Nodular and cirrhotic liver. No discrete liver lesion. Small chronic gallstones, up to 6 millimeters. No pericholecystic inflammation. No bile duct enlargement.  Pancreas: Negative.  Spleen: Splenomegaly is stable to mildly progressed. Previous estimated splenic volume was 1200 milliliters (normal splenic volume range 83 - 412 mL). Mildly heterogeneous splenic enhancement with small hypodense areas is unchanged.  Adrenals/Urinary Tract: Negative adrenal glands. Left nephrolithiasis now measures up to 7 millimeters. No left hydronephrosis and negative left ureter. Exophytic 6-7 centimeter left renal cyst with simple fluid density is stable.  There is new right hydronephrosis with perinephric stranding. There is a large 10 millimeter calculus lodged at  the right ureteropelvic junction. Additional punctate right intrarenal calculi. Distal to the obstructing stone the right ureter is decompressed. Unremarkable urinary bladder.  Stomach/Bowel: Negative rectum. Extensive diverticulosis of the descending and sigmoid colon but no definite active inflammation. Mild retained stool in the more upstream colon. Elongated but normal appendix (series 2, image 54). Negative terminal ileum. No dilated small bowel. Oral contrast in the stomach and proximal small bowel. No free air. No free fluid.  Vascular/Lymphatic: Mild calcified atherosclerosis. Major arterial structures are patent. Portal venous system is patent.  There are gastrohepatic ligament and distal paraesophageal varices (series 2, image 16). Small caliber mesenteric varices.  No lymphadenopathy.  Reproductive: Unusual bulky dystrophic calcifications in the bilateral seminal vesicles (coronal image 64). No regional inflammation. Heterogeneous mild prostate enlargement. Otherwise negative.  Other: No pelvic free fluid.  Musculoskeletal: No acute osseous abnormality identified.  IMPRESSION: 1. Acute obstructive uropathy on the right due to a 10 mm calculus lodged at the right ureteropelvic junction. 2. Bilateral nephrolithiasis, up to 7 mm now on the left. 3. Chronic cirrhosis and portal venous hypertension including splenomegaly, abdominal and paraesophageal varices. 4. Chronic cholelithiasis. 5. Extensive diverticulosis of the descending and sigmoid colon without active inflammation. 6. Small chronic hiatal hernia mostly containing fat.   Electronically Signed   By: Genevie Ann M.D.   On: 01/12/2019 20:59  I personally reviewed the images referenced above and note the obstructing 10 mm right UPJ stone.  Assessment & Plan:   Patient with new 88mm right UPJ stone with right hydronephrosis and no signs of urinary infection.  We discussed various treatment options  for his stone including trial of passage vs. ESWL vs. ureteroscopy with laser lithotripsy and stent. We discussed the risks and benefits of each including bleeding, infection, damage to surrounding structures, efficacy with need for possible further intervention, and need for temporary ureteral stent.  With a stone of this size, I estimate the chance of spontaneous passage at <3% over the next 4 weeks. I explained that with the stone's location, the stone-free rate is lower with ureteroscopy compared to ESWL.  Based on this conversation, patient would like to proceed with ureteroscopy. I am in agreement with this plan, particularly given his history of thrombocytopenia.  I counseled the patient that stents can cause pain in the flank or groin and/or gross hematuria. I informed him that he may expect to experience these symptoms for the duration of the stent being in place. I  counseled him that he should follow up with [place] urgently if he develops new fever, chills, nausea, or vomiting before it is removed, as these are not typical symptoms associated with a stent. He expressed understanding of this plan.   I counseled him to contact the office or proceed to the emergency department if outside normal office hours prior to surgery if he develops new, intractable pain, fevers, or chills.  Debroah Loop, PA-C  [facility] [address], [place], [place] [address] [phone]

## 2019-01-13 NOTE — H&P (View-Only) (Signed)
01/13/2019 3:43 PM   Ryan Willis. [birth year] [id number]  CC: 64mm right UPJ stone, RLQ and right flank pain, vomiting  HPI: Ryan Willis. is a 58 y.o. male who presents for management of known 35mm right UPJ stone.   He presented to the ED yesterday with a 1-day history of right flank and RLQ pain with nausea and vomiting. CT stone study revealed 26mm right UPJ stone with associated right hydronephrosis. There was also bilateral nonobstructing nephrolithiasis and a 6-7cm simple left renal cyst, stable from prior imaging. Urinalysis with >50 RBCs/hpf and 6-10 WBCs/hpf. WBC 4.3, creatinine 0.98. Urine culture pending. He was treated with fentanyl, phenergan, ondansetron, keterolac, and morphine in the ED and discharged on Percocet. Last NSAID dose yesterday.  In the interim, he reports improvement in his pain. He denies fevers, chills, and gross hematuria. He is not on anticoagulation, however he does have thrombocytopenia secondary to cirrhosis. Platelets 75 yesterday.  Per CT, stone is measured at up to 1400HU in density. Stone-to-skin distance estimated at approximately 12cm. Stone is visible on CT scout imaging.  PMH: Past Medical History:  Diagnosis Date  . Diverticulitis   . History of kidney stones   . Hypercholesteremia   . Hypothyroidism   . Peyronie's disease   . Renal cyst, left   . Seasonal allergies   . Thrombocytopenia [facility])     Surgical History: Past Surgical History:  Procedure Laterality Date  . COLONOSCOPY  2006  . COLONOSCOPY WITH PROPOFOL N/A 09/08/2017   Procedure: COLONOSCOPY WITH PROPOFOL;  Surgeon: [name], MD;  Location: [facility] ENDOSCOPY;  Service: Gastroenterology;  Laterality: N/A;  . COLONOSCOPY WITH PROPOFOL N/A 01/01/2018   Procedure: COLONOSCOPY WITH PROPOFOL;  Surgeon: [name], MD;  Location: [facility] ENDOSCOPY;  Service: Gastroenterology;  Laterality: N/A;  . ESOPHAGOGASTRODUODENOSCOPY (EGD) WITH PROPOFOL N/A 09/08/2017   Procedure:  ESOPHAGOGASTRODUODENOSCOPY (EGD) WITH PROPOFOL;  Surgeon: [name], MD;  Location: [facility] ENDOSCOPY;  Service: Gastroenterology;  Laterality: N/A;  . HERNIA REPAIR  1981  . SHOULDER SURGERY Right 2001    Home Medications:  Allergies as of 01/13/2019   No Known Allergies     Medication List       Accurate as of January 13, 2019  3:43 PM. If you have any questions, ask your nurse or doctor.        oxyCODONE-acetaminophen 5-325 MG tablet Commonly known as: Percocet Take 1 tablet by mouth every 4 (four) hours as needed.       Allergies: No Known Allergies  Family History: Family History  Problem Relation Age of Onset  . Kidney cancer Mother   . Colon cancer Father     Social History:  reports that he has never smoked. He has never used smokeless tobacco. He reports previous alcohol use. He reports that he does not use drugs.  ROS: UROLOGY Frequent Urination?: No Hard to postpone urination?: No Burning/pain with urination?: No Get up at night to urinate?: No Leakage of urine?: No Urine stream starts and stops?: No Trouble starting stream?: Yes Do you have to strain to urinate?: Yes Blood in urine?: No Urinary tract infection?: No Sexually transmitted disease?: No Injury to kidneys or bladder?: No Painful intercourse?: No Weak stream?: No Erection problems?: No Penile pain?: No  Gastrointestinal Nausea?: Yes Vomiting?: Yes Indigestion/heartburn?: No Diarrhea?: No Constipation?: No  Constitutional Fever: No Night sweats?: No Weight loss?: No Fatigue?: No  Skin Skin rash/lesions?: No Itching?: No  Eyes Blurred vision?: No Double vision?: No  Ears/Nose/Throat Sore throat?: No Sinus problems?: No  Hematologic/Lymphatic Swollen glands?: No Easy bruising?: No  Cardiovascular Leg swelling?: No Chest pain?: No  Respiratory Cough?: No Shortness of breath?: No  Endocrine Excessive thirst?: No  Musculoskeletal Back pain?: No Joint  pain?: No  Neurological Headaches?: No Dizziness?: No  Psychologic Depression?: No Anxiety?: No  Physical Exam: BP 130/75 (BP Location: Left Arm, Patient Position: Sitting, Cuff Size: Normal)   Pulse 77   Ht 5\' 6"  (1.676 m)   Wt 187 lb 12.8 oz (85.2 kg)   BMI 30.31 kg/m   Constitutional:  Alert and oriented, No acute distress. HEENT: [place] AT, moist mucus membranes.  Trachea midline, no masses. Cardiovascular: No clubbing, cyanosis, or edema. Respiratory: Normal respiratory effort, no increased work of breathing. GI: Abdomen is soft, nontender, no ascites Skin: No rashes, bruises or suspicious lesions. Neurologic: Grossly intact, no focal deficits, moving all 4 extremities. Psychiatric: Normal mood and affect.  Laboratory Data: Results for orders placed or performed during the hospital encounter of 01/12/19  Lipase, blood  Result Value Ref Range   Lipase 29 11 - 51 U/L  Comprehensive metabolic panel  Result Value Ref Range   Sodium 141 135 - 145 mmol/L   Potassium 4.3 3.5 - 5.1 mmol/L   Chloride 107 98 - 111 mmol/L   CO2 25 22 - 32 mmol/L   Glucose, Bld 123 (H) 70 - 99 mg/dL   BUN 14 6 - 20 mg/dL   Creatinine, Ser 0.98 0.61 - 1.24 mg/dL   Calcium 9.8 8.9 - 10.3 mg/dL   Total Protein 7.2 6.5 - 8.1 g/dL   Albumin 4.4 3.5 - 5.0 g/dL   AST 38 15 - 41 U/L   ALT 32 0 - 44 U/L   Alkaline Phosphatase 78 38 - 126 U/L   Total Bilirubin 2.2 (H) 0.3 - 1.2 mg/dL   GFR calc non Af Amer >60 >60 mL/min   GFR calc Af Amer >60 >60 mL/min   Anion gap 9 5 - 15  CBC  Result Value Ref Range   WBC 4.3 4.0 - 10.5 K/uL   RBC 4.50 4.22 - 5.81 MIL/uL   Hemoglobin 15.0 13.0 - 17.0 g/dL   HCT 43.0 39.0 - 52.0 %   MCV 95.6 80.0 - 100.0 fL   MCH 33.3 26.0 - 34.0 pg   MCHC 34.9 30.0 - 36.0 g/dL   RDW 12.7 11.5 - 15.5 %   Platelets 75 (L) 150 - 400 K/uL   nRBC 0.0 0.0 - 0.2 %  Urinalysis, Complete w Microscopic  Result Value Ref Range   Color, Urine YELLOW (A) YELLOW   APPearance CLEAR  (A) CLEAR   Specific Gravity, Urine >1.046 (H) 1.005 - 1.030   pH 7.0 5.0 - 8.0   Glucose, UA NEGATIVE NEGATIVE mg/dL   Hgb urine dipstick LARGE (A) NEGATIVE   Bilirubin Urine NEGATIVE NEGATIVE   Ketones, ur 5 (A) NEGATIVE mg/dL   Protein, ur NEGATIVE NEGATIVE mg/dL   Nitrite NEGATIVE NEGATIVE   Leukocytes,Ua NEGATIVE NEGATIVE   RBC / HPF >50 (H) 0 - 5 RBC/hpf   WBC, UA 6-10 0 - 5 WBC/hpf   Bacteria, UA NONE SEEN NONE SEEN   Squamous Epithelial / LPF NONE SEEN 0 - 5   Mucus PRESENT    Pertinent Imaging: CT abdomen pelvis w/ contrast 01/12/2019: CLINICAL DATA:  58 year old male with right side abdominal pain.  EXAM: CT ABDOMEN AND PELVIS WITH CONTRAST  TECHNIQUE: Multidetector CT imaging  of the abdomen and pelvis was performed using the standard protocol following bolus administration of intravenous contrast.  CONTRAST:  151mL OMNIPAQUE IOHEXOL 300 MG/ML  SOLN  COMPARISON:  CT Abdomen 07/15/2017.  FINDINGS: Lower chest: Stable minor lung base atelectasis or scarring. Small fat containing hiatal hernia is unchanged. Cardiac size at the upper limits of normal. No pericardial or pleural effusion.  Hepatobiliary: Nodular and cirrhotic liver. No discrete liver lesion. Small chronic gallstones, up to 6 millimeters. No pericholecystic inflammation. No bile duct enlargement.  Pancreas: Negative.  Spleen: Splenomegaly is stable to mildly progressed. Previous estimated splenic volume was 1200 milliliters (normal splenic volume range 83 - 412 mL). Mildly heterogeneous splenic enhancement with small hypodense areas is unchanged.  Adrenals/Urinary Tract: Negative adrenal glands. Left nephrolithiasis now measures up to 7 millimeters. No left hydronephrosis and negative left ureter. Exophytic 6-7 centimeter left renal cyst with simple fluid density is stable.  There is new right hydronephrosis with perinephric stranding. There is a large 10 millimeter calculus lodged at  the right ureteropelvic junction. Additional punctate right intrarenal calculi. Distal to the obstructing stone the right ureter is decompressed. Unremarkable urinary bladder.  Stomach/Bowel: Negative rectum. Extensive diverticulosis of the descending and sigmoid colon but no definite active inflammation. Mild retained stool in the more upstream colon. Elongated but normal appendix (series 2, image 54). Negative terminal ileum. No dilated small bowel. Oral contrast in the stomach and proximal small bowel. No free air. No free fluid.  Vascular/Lymphatic: Mild calcified atherosclerosis. Major arterial structures are patent. Portal venous system is patent.  There are gastrohepatic ligament and distal paraesophageal varices (series 2, image 16). Small caliber mesenteric varices.  No lymphadenopathy.  Reproductive: Unusual bulky dystrophic calcifications in the bilateral seminal vesicles (coronal image 64). No regional inflammation. Heterogeneous mild prostate enlargement. Otherwise negative.  Other: No pelvic free fluid.  Musculoskeletal: No acute osseous abnormality identified.  IMPRESSION: 1. Acute obstructive uropathy on the right due to a 10 mm calculus lodged at the right ureteropelvic junction. 2. Bilateral nephrolithiasis, up to 7 mm now on the left. 3. Chronic cirrhosis and portal venous hypertension including splenomegaly, abdominal and paraesophageal varices. 4. Chronic cholelithiasis. 5. Extensive diverticulosis of the descending and sigmoid colon without active inflammation. 6. Small chronic hiatal hernia mostly containing fat.   Electronically Signed   By: Genevie Ann M.D.   On: 01/12/2019 20:59  I personally reviewed the images referenced above and note the obstructing 10 mm right UPJ stone.  Assessment & Plan:   Patient with new 75mm right UPJ stone with right hydronephrosis and no signs of urinary infection.  We discussed various treatment options  for his stone including trial of passage vs. ESWL vs. ureteroscopy with laser lithotripsy and stent. We discussed the risks and benefits of each including bleeding, infection, damage to surrounding structures, efficacy with need for possible further intervention, and need for temporary ureteral stent.  With a stone of this size, I estimate the chance of spontaneous passage at <3% over the next 4 weeks. I explained that with the stone's location, the stone-free rate is lower with ureteroscopy compared to ESWL.  Based on this conversation, patient would like to proceed with ureteroscopy. I am in agreement with this plan, particularly given his history of thrombocytopenia.  I counseled the patient that stents can cause pain in the flank or groin and/or gross hematuria. I informed him that he may expect to experience these symptoms for the duration of the stent being in place. I  counseled him that he should follow up with [place] urgently if he develops new fever, chills, nausea, or vomiting before it is removed, as these are not typical symptoms associated with a stent. He expressed understanding of this plan.   I counseled him to contact the office or proceed to the emergency department if outside normal office hours prior to surgery if he develops new, intractable pain, fevers, or chills.  Debroah Loop, PA-C  [facility] [address] [place], [place] [address] [phone]

## 2019-01-13 NOTE — H&P (View-Only) (Signed)
01/13/2019 3:43 PM   [name]. July 06, 1960 [id number]  CC: 107mm right UPJ stone, RLQ and right flank pain, vomiting  HPI: Ryan Willis. is a 58 y.o. male who presents for management of known 19mm right UPJ stone.   He presented to the ED yesterday with a 1-day history of right flank and RLQ pain with nausea and vomiting. CT stone study revealed 65mm right UPJ stone with associated right hydronephrosis. There was also bilateral nonobstructing nephrolithiasis and a 6-7cm simple left renal cyst, stable from prior imaging. Urinalysis with >50 RBCs/hpf and 6-10 WBCs/hpf. WBC 4.3, creatinine 0.98. Urine culture pending. He was treated with fentanyl, phenergan, ondansetron, keterolac, and morphine in the ED and discharged on Percocet. Last NSAID dose yesterday.  In the interim, he reports improvement in his pain. He denies fevers, chills, and gross hematuria. He is not on anticoagulation, however he does have thrombocytopenia secondary to cirrhosis. Platelets 75 yesterday.  Per CT, stone is measured at up to 1400HU in density. Stone-to-skin distance estimated at approximately 12cm. Stone is visible on CT scout imaging.  PMH: Past Medical History:  Diagnosis Date  . Diverticulitis   . History of kidney stones   . Hypercholesteremia   . Hypothyroidism   . Peyronie's disease   . Renal cyst, left   . Seasonal allergies   . Thrombocytopenia [facility])     Surgical History: Past Surgical History:  Procedure Laterality Date  . COLONOSCOPY  2006  . COLONOSCOPY WITH PROPOFOL N/A 09/08/2017   Procedure: COLONOSCOPY WITH PROPOFOL;  Surgeon: [name], MD;  Location: [facility] ENDOSCOPY;  Service: Gastroenterology;  Laterality: N/A;  . COLONOSCOPY WITH PROPOFOL N/A 01/01/2018   Procedure: COLONOSCOPY WITH PROPOFOL;  Surgeon: [name], MD;  Location: [facility] ENDOSCOPY;  Service: Gastroenterology;  Laterality: N/A;  . ESOPHAGOGASTRODUODENOSCOPY (EGD) WITH PROPOFOL N/A 09/08/2017   Procedure:  ESOPHAGOGASTRODUODENOSCOPY (EGD) WITH PROPOFOL;  Surgeon: [name], MD;  Location: [facility] ENDOSCOPY;  Service: Gastroenterology;  Laterality: N/A;  . HERNIA REPAIR  1981  . SHOULDER SURGERY Right 2001    Home Medications:  Allergies as of 01/13/2019   No Known Allergies     Medication List       Accurate as of January 13, 2019  3:43 PM. If you have any questions, ask your nurse or doctor.        oxyCODONE-acetaminophen 5-325 MG tablet Commonly known as: Percocet Take 1 tablet by mouth every 4 (four) hours as needed.       Allergies: No Known Allergies  Family History: Family History  Problem Relation Age of Onset  . Kidney cancer Mother   . Colon cancer Father     Social History:  reports that he has never smoked. He has never used smokeless tobacco. He reports previous alcohol use. He reports that he does not use drugs.  ROS: UROLOGY Frequent Urination?: No Hard to postpone urination?: No Burning/pain with urination?: No Get up at night to urinate?: No Leakage of urine?: No Urine stream starts and stops?: No Trouble starting stream?: Yes Do you have to strain to urinate?: Yes Blood in urine?: No Urinary tract infection?: No Sexually transmitted disease?: No Injury to kidneys or bladder?: No Painful intercourse?: No Weak stream?: No Erection problems?: No Penile pain?: No  Gastrointestinal Nausea?: Yes Vomiting?: Yes Indigestion/heartburn?: No Diarrhea?: No Constipation?: No  Constitutional Fever: No Night sweats?: No Weight loss?: No Fatigue?: No  Skin Skin rash/lesions?: No Itching?: No  Eyes Blurred vision?: No Double vision?: No  Ears/Nose/Throat Sore throat?: No Sinus problems?: No  Hematologic/Lymphatic Swollen glands?: No Easy bruising?: No  Cardiovascular Leg swelling?: No Chest pain?: No  Respiratory Cough?: No Shortness of breath?: No  Endocrine Excessive thirst?: No  Musculoskeletal Back pain?: No Joint  pain?: No  Neurological Headaches?: No Dizziness?: No  Psychologic Depression?: No Anxiety?: No  Physical Exam: BP 130/75 (BP Location: Left Arm, Patient Position: Sitting, Cuff Size: Normal)   Pulse 77   Ht 5\' 6"  (1.676 m)   Wt 187 lb 12.8 oz (85.2 kg)   BMI 30.31 kg/m   Constitutional:  Alert and oriented, No acute distress. HEENT: [place] AT, moist mucus membranes.  Trachea midline, no masses. Cardiovascular: No clubbing, cyanosis, or edema. Respiratory: Normal respiratory effort, no increased work of breathing. GI: Abdomen is soft, nontender, no ascites Skin: No rashes, bruises or suspicious lesions. Neurologic: Grossly intact, no focal deficits, moving all 4 extremities. Psychiatric: Normal mood and affect.  Laboratory Data: Results for orders placed or performed during the hospital encounter of 01/12/19  Lipase, blood  Result Value Ref Range   Lipase 29 11 - 51 U/L  Comprehensive metabolic panel  Result Value Ref Range   Sodium 141 135 - 145 mmol/L   Potassium 4.3 3.5 - 5.1 mmol/L   Chloride 107 98 - 111 mmol/L   CO2 25 22 - 32 mmol/L   Glucose, Bld 123 (H) 70 - 99 mg/dL   BUN 14 6 - 20 mg/dL   Creatinine, Ser 0.98 0.61 - 1.24 mg/dL   Calcium 9.8 8.9 - 10.3 mg/dL   Total Protein 7.2 6.5 - 8.1 g/dL   Albumin 4.4 3.5 - 5.0 g/dL   AST 38 15 - 41 U/L   ALT 32 0 - 44 U/L   Alkaline Phosphatase 78 38 - 126 U/L   Total Bilirubin 2.2 (H) 0.3 - 1.2 mg/dL   GFR calc non Af Amer >60 >60 mL/min   GFR calc Af Amer >60 >60 mL/min   Anion gap 9 5 - 15  CBC  Result Value Ref Range   WBC 4.3 4.0 - 10.5 K/uL   RBC 4.50 4.22 - 5.81 MIL/uL   Hemoglobin 15.0 13.0 - 17.0 g/dL   HCT 43.0 39.0 - 52.0 %   MCV 95.6 80.0 - 100.0 fL   MCH 33.3 26.0 - 34.0 pg   MCHC 34.9 30.0 - 36.0 g/dL   RDW 12.7 11.5 - 15.5 %   Platelets 75 (L) 150 - 400 K/uL   nRBC 0.0 0.0 - 0.2 %  Urinalysis, Complete w Microscopic  Result Value Ref Range   Color, Urine YELLOW (A) YELLOW   APPearance CLEAR  (A) CLEAR   Specific Gravity, Urine >1.046 (H) 1.005 - 1.030   pH 7.0 5.0 - 8.0   Glucose, UA NEGATIVE NEGATIVE mg/dL   Hgb urine dipstick LARGE (A) NEGATIVE   Bilirubin Urine NEGATIVE NEGATIVE   Ketones, ur 5 (A) NEGATIVE mg/dL   Protein, ur NEGATIVE NEGATIVE mg/dL   Nitrite NEGATIVE NEGATIVE   Leukocytes,Ua NEGATIVE NEGATIVE   RBC / HPF >50 (H) 0 - 5 RBC/hpf   WBC, UA 6-10 0 - 5 WBC/hpf   Bacteria, UA NONE SEEN NONE SEEN   Squamous Epithelial / LPF NONE SEEN 0 - 5   Mucus PRESENT    Pertinent Imaging: CT abdomen pelvis w/ contrast 01/12/2019: CLINICAL DATA:  58 year old male with right side abdominal pain.  EXAM: CT ABDOMEN AND PELVIS WITH CONTRAST  TECHNIQUE: Multidetector CT imaging  of the abdomen and pelvis was performed using the standard protocol following bolus administration of intravenous contrast.  CONTRAST:  160mL OMNIPAQUE IOHEXOL 300 MG/ML  SOLN  COMPARISON:  CT Abdomen 07/15/2017.  FINDINGS: Lower chest: Stable minor lung base atelectasis or scarring. Small fat containing hiatal hernia is unchanged. Cardiac size at the upper limits of normal. No pericardial or pleural effusion.  Hepatobiliary: Nodular and cirrhotic liver. No discrete liver lesion. Small chronic gallstones, up to 6 millimeters. No pericholecystic inflammation. No bile duct enlargement.  Pancreas: Negative.  Spleen: Splenomegaly is stable to mildly progressed. Previous estimated splenic volume was 1200 milliliters (normal splenic volume range 83 - 412 mL). Mildly heterogeneous splenic enhancement with small hypodense areas is unchanged.  Adrenals/Urinary Tract: Negative adrenal glands. Left nephrolithiasis now measures up to 7 millimeters. No left hydronephrosis and negative left ureter. Exophytic 6-7 centimeter left renal cyst with simple fluid density is stable.  There is new right hydronephrosis with perinephric stranding. There is a large 10 millimeter calculus lodged at  the right ureteropelvic junction. Additional punctate right intrarenal calculi. Distal to the obstructing stone the right ureter is decompressed. Unremarkable urinary bladder.  Stomach/Bowel: Negative rectum. Extensive diverticulosis of the descending and sigmoid colon but no definite active inflammation. Mild retained stool in the more upstream colon. Elongated but normal appendix (series 2, image 54). Negative terminal ileum. No dilated small bowel. Oral contrast in the stomach and proximal small bowel. No free air. No free fluid.  Vascular/Lymphatic: Mild calcified atherosclerosis. Major arterial structures are patent. Portal venous system is patent.  There are gastrohepatic ligament and distal paraesophageal varices (series 2, image 16). Small caliber mesenteric varices.  No lymphadenopathy.  Reproductive: Unusual bulky dystrophic calcifications in the bilateral seminal vesicles (coronal image 64). No regional inflammation. Heterogeneous mild prostate enlargement. Otherwise negative.  Other: No pelvic free fluid.  Musculoskeletal: No acute osseous abnormality identified.  IMPRESSION: 1. Acute obstructive uropathy on the right due to a 10 mm calculus lodged at the right ureteropelvic junction. 2. Bilateral nephrolithiasis, up to 7 mm now on the left. 3. Chronic cirrhosis and portal venous hypertension including splenomegaly, abdominal and paraesophageal varices. 4. Chronic cholelithiasis. 5. Extensive diverticulosis of the descending and sigmoid colon without active inflammation. 6. Small chronic hiatal hernia mostly containing fat.   Electronically Signed   By: Genevie Ann M.D.   On: 01/12/2019 20:59  I personally reviewed the images referenced above and note the obstructing 10 mm right UPJ stone.  Assessment & Plan:   Patient with new 79mm right UPJ stone with right hydronephrosis and no signs of urinary infection.  We discussed various treatment options  for his stone including trial of passage vs. ESWL vs. ureteroscopy with laser lithotripsy and stent. We discussed the risks and benefits of each including bleeding, infection, damage to surrounding structures, efficacy with need for possible further intervention, and need for temporary ureteral stent.  With a stone of this size, I estimate the chance of spontaneous passage at <3% over the next 4 weeks. I explained that with the stone's location, the stone-free rate is lower with ureteroscopy compared to ESWL.  Based on this conversation, patient would like to proceed with ureteroscopy. I am in agreement with this plan, particularly given his history of thrombocytopenia.  I counseled the patient that stents can cause pain in the flank or groin and/or gross hematuria. I informed him that he may expect to experience these symptoms for the duration of the stent being in place. I  counseled him that he should follow up with [place] urgently if he develops new fever, chills, nausea, or vomiting before it is removed, as these are not typical symptoms associated with a stent. He expressed understanding of this plan.   I counseled him to contact the office or proceed to the emergency department if outside normal office hours prior to surgery if he develops new, intractable pain, fevers, or chills.  Debroah Loop, PA-C  [facility] [address], [place], [place] [address] [phone]

## 2019-01-14 ENCOUNTER — Telehealth: Payer: Self-pay | Admitting: Urology

## 2019-01-14 ENCOUNTER — Other Ambulatory Visit: Payer: Self-pay

## 2019-01-14 ENCOUNTER — Other Ambulatory Visit
Admission: RE | Admit: 2019-01-14 | Discharge: 2019-01-14 | Disposition: A | Discharge status: Home / Self Care | Payer: 59 | Source: Ambulatory Visit | Attending: Urology | Admitting: Urology

## 2019-01-14 DIAGNOSIS — Z01812 Encounter for preprocedural laboratory examination: Secondary | ICD-10-CM | POA: Diagnosis not present

## 2019-01-14 DIAGNOSIS — Z20828 Contact with and (suspected) exposure to other viral communicable diseases: Secondary | ICD-10-CM | POA: Diagnosis not present

## 2019-01-14 LAB — URINE CULTURE: Culture: NO GROWTH

## 2019-01-14 LAB — SARS CORONAVIRUS 2 (TAT 6-24 HRS): SARS Coronavirus 2: NEGATIVE

## 2019-01-14 MED ORDER — OXYCODONE HCL 5 MG PO TABS: 5.0000 mg | ORAL_TABLET | ORAL | 0 refills | Status: AC | PRN

## 2019-01-14 NOTE — Telephone Encounter (Signed)
Should he just utilize what he has and try OTC NSAIDS?

## 2019-01-14 NOTE — Telephone Encounter (Signed)
I just called and spoke with the patient. He took one Percocet about an hour ago following his car ride. He notes the pain is somewhat improved, but still severe. Given that his pain is responsive to PO medication and he does not report any new symptoms, I do not believe he requires urgent intervention at this time.  I do not recommend NSAIDs due to his thrombocytopenia in the setting of cirrhosis. I also do not recommend that he increase his dose of Percocet due to its acetaminophen content with his liver disease.  I have prescribed 3-days' worth of oxycodone 5mg  Q4 PRN to augment his Percocet. I advised him to take one of each tablet for severe pain and not to take more than one Percocet at a time. He expressed understanding.

## 2019-01-14 NOTE — Telephone Encounter (Signed)
PT called office and says he is in a lot of pain.  He is scheduled for surgery on Monday w/[facility].  He said riding in the car made it worse when he had to come for COVID test today.  He does have about 15 percocets to make it through the weekend, but said he was told to call office if pain persisted or got worse.

## 2019-01-17 ENCOUNTER — Encounter
Admission: RE | Disposition: A | Discharge status: Home / Self Care | Payer: Self-pay | Source: Home / Self Care | Attending: Urology

## 2019-01-17 ENCOUNTER — Encounter: Payer: Self-pay | Admitting: *Deleted

## 2019-01-17 ENCOUNTER — Ambulatory Visit: Payer: 59 | Admitting: Anesthesiology

## 2019-01-17 ENCOUNTER — Ambulatory Visit: Payer: 59

## 2019-01-17 ENCOUNTER — Ambulatory Visit
Admission: RE | Admit: 2019-01-17 | Discharge: 2019-01-17 | Disposition: A | Discharge status: Home / Self Care | Payer: 59 | Attending: Urology | Admitting: Urology

## 2019-01-17 ENCOUNTER — Other Ambulatory Visit: Payer: Self-pay

## 2019-01-17 DIAGNOSIS — I8500 Esophageal varices without bleeding: Secondary | ICD-10-CM | POA: Diagnosis not present

## 2019-01-17 DIAGNOSIS — N132 Hydronephrosis with renal and ureteral calculous obstruction: Secondary | ICD-10-CM | POA: Insufficient documentation

## 2019-01-17 DIAGNOSIS — K8020 Calculus of gallbladder without cholecystitis without obstruction: Secondary | ICD-10-CM | POA: Insufficient documentation

## 2019-01-17 DIAGNOSIS — N486 Induration penis plastica: Secondary | ICD-10-CM | POA: Diagnosis not present

## 2019-01-17 DIAGNOSIS — K766 Portal hypertension: Secondary | ICD-10-CM | POA: Insufficient documentation

## 2019-01-17 DIAGNOSIS — E039 Hypothyroidism, unspecified: Secondary | ICD-10-CM | POA: Diagnosis not present

## 2019-01-17 DIAGNOSIS — Z87442 Personal history of urinary calculi: Secondary | ICD-10-CM | POA: Insufficient documentation

## 2019-01-17 DIAGNOSIS — D6959 Other secondary thrombocytopenia: Secondary | ICD-10-CM | POA: Insufficient documentation

## 2019-01-17 DIAGNOSIS — E7800 Pure hypercholesterolemia, unspecified: Secondary | ICD-10-CM | POA: Insufficient documentation

## 2019-01-17 DIAGNOSIS — K449 Diaphragmatic hernia without obstruction or gangrene: Secondary | ICD-10-CM | POA: Insufficient documentation

## 2019-01-17 DIAGNOSIS — K7460 Unspecified cirrhosis of liver: Secondary | ICD-10-CM | POA: Diagnosis not present

## 2019-01-17 DIAGNOSIS — K5730 Diverticulosis of large intestine without perforation or abscess without bleeding: Secondary | ICD-10-CM | POA: Insufficient documentation

## 2019-01-17 DIAGNOSIS — N201 Calculus of ureter: Secondary | ICD-10-CM

## 2019-01-17 DIAGNOSIS — N281 Cyst of kidney, acquired: Secondary | ICD-10-CM | POA: Diagnosis not present

## 2019-01-17 DIAGNOSIS — R161 Splenomegaly, not elsewhere classified: Secondary | ICD-10-CM | POA: Insufficient documentation

## 2019-01-17 HISTORY — PX: CYSTOSCOPY/URETEROSCOPY/HOLMIUM LASER/STENT PLACEMENT: SHX6546

## 2019-01-17 SURGERY — CYSTOSCOPY/URETEROSCOPY/HOLMIUM LASER/STENT PLACEMENT
Anesthesia: General | Laterality: Right

## 2019-01-17 MED ORDER — LACTATED RINGERS IV SOLN
Freq: Once | INTRAVENOUS | Status: AC
  Administered 2019-01-17: 12:00:00 via INTRAVENOUS

## 2019-01-17 MED ORDER — GLYCOPYRROLATE 0.2 MG/ML IJ SOLN
INTRAMUSCULAR | Status: AC
  Filled 2019-01-17: qty 1

## 2019-01-17 MED ORDER — HYDROCODONE-ACETAMINOPHEN 5-325 MG PO TABS: 1.0000 | ORAL_TABLET | Freq: Four times a day (QID) | ORAL | 0 refills | Status: DC | PRN

## 2019-01-17 MED ORDER — DOCUSATE SODIUM 100 MG PO CAPS: 100.0000 mg | ORAL_CAPSULE | Freq: Two times a day (BID) | ORAL | 0 refills | Status: DC

## 2019-01-17 MED ORDER — LIDOCAINE HCL (PF) 2 % IJ SOLN
INTRAMUSCULAR | Status: AC
  Filled 2019-01-17: qty 10

## 2019-01-17 MED ORDER — CEFAZOLIN SODIUM-DEXTROSE 2-4 GM/100ML-% IV SOLN
INTRAVENOUS | Status: AC
  Filled 2019-01-17: qty 100

## 2019-01-17 MED ORDER — MIDAZOLAM HCL 2 MG/2ML IJ SOLN
INTRAMUSCULAR | Status: DC | PRN
  Administered 2019-01-17: 2 mg via INTRAVENOUS

## 2019-01-17 MED ORDER — LIDOCAINE HCL (CARDIAC) PF 100 MG/5ML IV SOSY
PREFILLED_SYRINGE | INTRAVENOUS | Status: DC | PRN
  Administered 2019-01-17: 100 mg via INTRAVENOUS

## 2019-01-17 MED ORDER — SUCCINYLCHOLINE CHLORIDE 20 MG/ML IJ SOLN
INTRAMUSCULAR | Status: DC | PRN
  Administered 2019-01-17: 100 mg via INTRAVENOUS

## 2019-01-17 MED ORDER — FENTANYL CITRATE (PF) 100 MCG/2ML IJ SOLN
INTRAMUSCULAR | Status: AC
  Filled 2019-01-17: qty 2

## 2019-01-17 MED ORDER — GLYCOPYRROLATE 0.2 MG/ML IJ SOLN
INTRAMUSCULAR | Status: DC | PRN
  Administered 2019-01-17: 0.2 mg via INTRAVENOUS

## 2019-01-17 MED ORDER — IOPAMIDOL (ISOVUE-200) INJECTION 41%
INTRAVENOUS | Status: DC | PRN
  Administered 2019-01-17: 16:00:00 10 mL

## 2019-01-17 MED ORDER — TAMSULOSIN HCL 0.4 MG PO CAPS: 0.4000 mg | ORAL_CAPSULE | Freq: Every day | ORAL | 0 refills | Status: DC

## 2019-01-17 MED ORDER — CEFAZOLIN SODIUM-DEXTROSE 2-4 GM/100ML-% IV SOLN
2.0000 g | INTRAVENOUS | Status: AC
  Administered 2019-01-17: 2 g via INTRAVENOUS

## 2019-01-17 MED ORDER — PROPOFOL 10 MG/ML IV BOLUS
INTRAVENOUS | Status: DC | PRN
  Administered 2019-01-17: 50 mg via INTRAVENOUS
  Administered 2019-01-17: 150 mg via INTRAVENOUS

## 2019-01-17 MED ORDER — MIDAZOLAM HCL 2 MG/2ML IJ SOLN
INTRAMUSCULAR | Status: AC
  Filled 2019-01-17: qty 2

## 2019-01-17 MED ORDER — FENTANYL CITRATE (PF) 100 MCG/2ML IJ SOLN
INTRAMUSCULAR | Status: DC | PRN
  Administered 2019-01-17 (×2): 100 ug via INTRAVENOUS

## 2019-01-17 MED ORDER — OXYBUTYNIN CHLORIDE 5 MG PO TABS: 5.0000 mg | ORAL_TABLET | Freq: Three times a day (TID) | ORAL | 0 refills | Status: DC | PRN

## 2019-01-17 MED ORDER — DEXAMETHASONE SODIUM PHOSPHATE 10 MG/ML IJ SOLN
INTRAMUSCULAR | Status: AC
  Filled 2019-01-17: qty 1

## 2019-01-17 MED ORDER — ONDANSETRON HCL 4 MG/2ML IJ SOLN: 4.0000 mg | Freq: Once | INTRAMUSCULAR | Status: DC | PRN

## 2019-01-17 MED ORDER — FENTANYL CITRATE (PF) 100 MCG/2ML IJ SOLN: 25.0000 ug | INTRAMUSCULAR | Status: DC | PRN

## 2019-01-17 MED ORDER — DEXAMETHASONE SODIUM PHOSPHATE 10 MG/ML IJ SOLN
INTRAMUSCULAR | Status: DC | PRN
  Administered 2019-01-17: 10 mg via INTRAVENOUS

## 2019-01-17 MED ORDER — LACTATED RINGERS IV SOLN
INTRAVENOUS | Status: DC | PRN
  Administered 2019-01-17: 15:00:00 via INTRAVENOUS

## 2019-01-17 MED ORDER — ONDANSETRON HCL 4 MG/2ML IJ SOLN
INTRAMUSCULAR | Status: AC
  Filled 2019-01-17: qty 2

## 2019-01-17 MED ORDER — PROPOFOL 10 MG/ML IV BOLUS
INTRAVENOUS | Status: AC
  Filled 2019-01-17: qty 20

## 2019-01-17 MED ORDER — SUCCINYLCHOLINE CHLORIDE 20 MG/ML IJ SOLN
INTRAMUSCULAR | Status: AC
  Filled 2019-01-17: qty 1

## 2019-01-17 MED ORDER — ONDANSETRON HCL 4 MG/2ML IJ SOLN
INTRAMUSCULAR | Status: DC | PRN
  Administered 2019-01-17: 4 mg via INTRAVENOUS

## 2019-01-17 SURGICAL SUPPLY — 27 items
BAG DRAIN CYSTO-URO LG1000N (MISCELLANEOUS) ×2 IMPLANT
BASKET ZERO TIP 1.9FR (BASKET) IMPLANT
BRUSH SCRUB EZ 1% IODOPHOR (MISCELLANEOUS) ×2 IMPLANT
CATH URETL 5X70 OPEN END (CATHETERS) ×2 IMPLANT
CNTNR SPEC 2.5X3XGRAD LEK (MISCELLANEOUS)
CONT SPEC 4OZ STER OR WHT (MISCELLANEOUS)
CONTAINER SPEC 2.5X3XGRAD LEK (MISCELLANEOUS) IMPLANT
DRAPE UTILITY 15X26 TOWEL STRL (DRAPES) ×2 IMPLANT
FIBER LASER TRAC TIP (UROLOGICAL SUPPLIES) IMPLANT
GLOVE BIO SURGEON STRL SZ 6.5 (GLOVE) ×2 IMPLANT
GOWN STRL REUS W/ TWL LRG LVL3 (GOWN DISPOSABLE) ×2 IMPLANT
GOWN STRL REUS W/TWL LRG LVL3 (GOWN DISPOSABLE) ×2
GUIDEWIRE GREEN .038 145CM (MISCELLANEOUS) ×1 IMPLANT
GUIDEWIRE INTRO SET STRAIGHT (WIRE) ×1 IMPLANT
GUIDEWIRE STR DUAL SENSOR (WIRE) ×2 IMPLANT
INFUSOR MANOMETER BAG 3000ML (MISCELLANEOUS) ×2 IMPLANT
INTRODUCER DILATOR DOUBLE (INTRODUCER) ×1 IMPLANT
KIT TURNOVER CYSTO (KITS) ×2 IMPLANT
PACK CYSTO AR (MISCELLANEOUS) ×2 IMPLANT
SET CYSTO W/LG BORE CLAMP LF (SET/KITS/TRAYS/PACK) ×2 IMPLANT
SHEATH URETERAL 12FRX35CM (MISCELLANEOUS) IMPLANT
SOL .9 NS 3000ML IRR  AL (IV SOLUTION) ×1
SOL .9 NS 3000ML IRR UROMATIC (IV SOLUTION) ×1 IMPLANT
STENT URET 6FRX24 CONTOUR (STENTS) ×1 IMPLANT
STENT URET 6FRX26 CONTOUR (STENTS) IMPLANT
SURGILUBE 2OZ TUBE FLIPTOP (MISCELLANEOUS) ×2 IMPLANT
WATER STERILE IRR 1000ML POUR (IV SOLUTION) ×2 IMPLANT

## 2019-01-17 NOTE — Transfer of Care (Signed)
Immediate Anesthesia Transfer of Care Note  Patient: Ryan Willis.  Procedure(s) Performed: CYSTOSCOPY/URETEROSCOPY/HOLMIUM LASER/STENT PLACEMENT (Right )  Patient Location: PACU  Anesthesia Type:General  Level of Consciousness: awake, alert  and oriented  Airway & Oxygen Therapy: Patient Spontanous Breathing and Patient connected to face mask oxygen  Post-op Assessment: Report given to RN and Post -op Vital signs reviewed and stable  Post vital signs: Reviewed and stable  Last Vitals:  Vitals Value Taken Time  BP    Temp    Pulse    Resp    SpO2      Last Pain:  Vitals:   01/17/19 1208  TempSrc: Oral  PainSc: 3          Complications: No apparent anesthesia complications

## 2019-01-17 NOTE — Discharge Instructions (Addendum)
You have a ureteral stent in place.  This is a tube that extends from your kidney to your bladder.  This may cause urinary bleeding, burning with urination, and urinary frequency.  Please call our office or present to the ED if you develop fevers >101 or pain which is not able to be controlled with oral pain medications.  You may be given either Flomax and/ or ditropan to help with bladder spasms and stent pain in addition to pain medications.   ° °[facility] °[address], [address] °[place], [place] [address] °[phone] ° ° ° °AMBULATORY SURGERY  °DISCHARGE INSTRUCTIONS ° ° °1) The drugs that you were given will stay in your system until tomorrow so for the next 24 hours you should not: ° °A) Drive an automobile °B) Make any legal decisions °C) Drink any alcoholic beverage ° ° °2) You may resume regular meals tomorrow.  Today it is better to start with liquids and gradually work up to solid foods. ° °You may eat anything you prefer, but it is better to start with liquids, then soup and crackers, and gradually work up to solid foods. ° ° °3) Please notify your doctor immediately if you have any unusual bleeding, trouble breathing, redness and pain at the surgery site, drainage, fever, or pain not relieved by medication. ° ° ° °4) Additional Instructions: ° ° ° ° ° ° ° °Please contact your physician with any problems or Same Day Surgery at [phone], Monday through Friday 6 am to 4 pm, or [place] [facility] [place] Main number at [phone]. °

## 2019-01-17 NOTE — Anesthesia Procedure Notes (Signed)
Procedure Name: Intubation Date/Time: 01/17/2019 3:01 PM Performed by: Rona Ravens, CRNA Pre-anesthesia Checklist: Patient identified, Suction available, Emergency Drugs available, Patient being monitored and Timeout performed Patient Re-evaluated:Patient Re-evaluated prior to induction Oxygen Delivery Method: Circle system utilized Preoxygenation: Pre-oxygenation with 100% oxygen Induction Type: IV induction and Rapid sequence Laryngoscope Size: Mac and 3 Grade View: Grade II Tube type: Oral Tube size: 7.0 mm Number of attempts: 1 Airway Equipment and Method: Stylet Placement Confirmation: positive ETCO2,  ETT inserted through vocal cords under direct vision,  CO2 detector and breath sounds checked- equal and bilateral Secured at: 22 cm Tube secured with: Tape Dental Injury: Teeth and Oropharynx as per pre-operative assessment

## 2019-01-17 NOTE — Anesthesia Post-op Follow-up Note (Signed)
Anesthesia QCDR form completed.        

## 2019-01-17 NOTE — Anesthesia Preprocedure Evaluation (Addendum)
Anesthesia Evaluation  Patient identified by MRN, date of birth, ID band Patient awake    Reviewed: Allergy & Precautions, H&P , NPO status , Patient's Chart, lab work & pertinent test results, reviewed documented beta blocker date and time   Airway Mallampati: II  TM Distance: >3 FB Neck ROM: full    Dental  (+) Teeth Intact   Pulmonary neg pulmonary ROS,    Pulmonary exam normal        Cardiovascular Exercise Tolerance: Good negative cardio ROS Normal cardiovascular exam Rate:Normal     Neuro/Psych negative neurological ROS  negative psych ROS   GI/Hepatic negative GI ROS, Neg liver ROS,   Endo/Other  Hypothyroidism   Renal/GU Renal disease  negative genitourinary   Musculoskeletal   Abdominal   Peds  Hematology negative hematology ROS (+)   Anesthesia Other Findings   Reproductive/Obstetrics negative OB ROS                             Anesthesia Physical Anesthesia Plan  ASA: III  Anesthesia Plan: General LMA   Post-op Pain Management:    Induction: Intravenous  PONV Risk Score and Plan: 3  Airway Management Planned: LMA  Additional Equipment:   Intra-op Plan:   Post-operative Plan:   Informed Consent: I have reviewed the patients History and Physical, chart, labs and discussed the procedure including the risks, benefits and alternatives for the proposed anesthesia with the patient or authorized representative who has indicated his/her understanding and acceptance.       Plan Discussed with: CRNA  Anesthesia Plan Comments:       Anesthesia Quick Evaluation

## 2019-01-17 NOTE — Op Note (Signed)
Date of procedure: 01/17/19  Preoperative diagnosis:  1. Right proximal ureteral stone  Postoperative diagnosis:  1. Same as above  Procedure: 1. Attempted right ureteroscopy 2. Retrograde pyelogram 3. Right ureteral stent placement  Surgeon: Hollice Espy, MD  Anesthesia: General  Complications: None  Intraoperative findings: Prostamegaly with elevated bladder neck, stenotic right UO.  Unable to advance scope beyond the distal ureter despite dilation.  Stent placed for planned staged procedure.  EBL: Minimal  Specimens: None  Drains: 6 x 26 French double-J ureteral stent on right  Indication: Ryan Willis. is a 58 y.o. patient with 10 mm right UPJ stone was elected to undergo ureteroscopy.  After reviewing the management options for treatment, he elected to proceed with the above surgical procedure(s). We have discussed the potential benefits and risks of the procedure, side effects of the proposed treatment, the likelihood of the patient achieving the goals of the procedure, and any potential problems that might occur during the procedure or recuperation. Informed consent has been obtained.  Description of procedure:  The patient was taken to the operating room and general anesthesia was induced.  The patient was placed in the dorsal lithotomy position, prepped and draped in the usual sterile fashion, and preoperative antibiotics were administered. A preoperative time-out was performed.   21 [place] scope was advanced per urethra into the bladder.  Notably, the prostate was enlarged and friable with bleeding noted with manipulation.  The bladder neck was elevated.  Attention was turned to the right ureteral orifice was a somewhat difficult due to the size of the gland.  Ultimately, I was able to reduce an open-ended ureteral catheter just within the UO and a gentle retrograde pyelogram revealed a delicate appearing ureter up to level of the stone which could be seen on imaging  quite clearly.  The stones position was similar to as it was on the previous CT scan.  There was some mild hydronephrosis proximal to the stone involving the collecting system.  A sensor wire was unable to be placed beyond the stone to the level of the kidney" on the renal pelvis.  Next, I attempted to place a Super Stiff wire using a dual-lumen access sheath however I was unable to introduce this into the UO.  As such, I used a 8/10 dilator.  The 8 [place] portion of the dilator was able to be advanced all the way to the proximal ureter without difficulty.  I then placed the 10 French outer cannula over this which had a radiopaque tip but was unable to advance this beyond the distal ureter due to buckling.  The inner lumen was removed which allowed me to use the dilator as a second safety wire introducer at which time I introduced a Super Stiff wire up to level the kidney.  Upon introducing this wire, I knocked the stone into the right lower pole.  The dilator was then removed.  Elected to attempt placement of a single channel flexible digital ureteroscope over the Super Stiff wire with excluding the safety wire.  Several attempts are made that there is significant buckling within the distal ureter and the scope would not go easily.  Due to concern for causing injury to the ureter, elected to perform a staged procedure.  The Super Stiff wire was removed leaving only the safety wire which is backloaded over rigid cystoscope.  A 6 x 28 6 French double-J ureteral stent was advanced over the wire up to level the renal pelvis which is  partially drawn.  A focal is noted within the renal pelvis as well as within the bladder.  The bladder was then drained.  Patient was then clean and dry, repositioned supine incision, restrained seizure, and taken the PACU in stable condition.  Plan: we will plan to return as a staged procedure after passive dilation from the ureteral stent.  I discussed this plan extensively with the  patient's wife.  She is agreeable this plan.  We will tentatively have him rebooked about 2 weeks.  Hollice Espy, M.D.

## 2019-01-17 NOTE — Interval H&P Note (Signed)
History and Physical Interval Note:  01/17/2019 1:08 PM  Ryan Willis.  has presented today for surgery, with the diagnosis of right ureteropelvic junction stone.  The various methods of treatment have been discussed with the patient and family. After consideration of risks, benefits and other options for treatment, the patient has consented to  Procedure(s): CYSTOSCOPY/URETEROSCOPY/HOLMIUM LASER/STENT PLACEMENT (Right) as a surgical intervention.  The patient's history has been reviewed, patient examined, no change in status, stable for surgery.  I have reviewed the patient's chart and labs.  Questions were answered to the patient's satisfaction.    RRR CTAB   Hollice Espy

## 2019-01-18 ENCOUNTER — Other Ambulatory Visit: Payer: Self-pay | Admitting: Radiology

## 2019-01-18 ENCOUNTER — Encounter: Payer: Self-pay | Admitting: Urology

## 2019-01-18 DIAGNOSIS — Z01818 Encounter for other preprocedural examination: Secondary | ICD-10-CM

## 2019-01-18 DIAGNOSIS — N201 Calculus of ureter: Secondary | ICD-10-CM

## 2019-01-18 NOTE — Anesthesia Postprocedure Evaluation (Signed)
Anesthesia Post Note  Patient: Blong Pike.  Procedure(s) Performed: CYSTOSCOPY/URETEROSCOPY/STENT PLACEMENT (Right )  Patient location during evaluation: PACU Anesthesia Type: General Level of consciousness: awake and alert Pain management: pain level controlled Vital Signs Assessment: post-procedure vital signs reviewed and stable Respiratory status: spontaneous breathing, nonlabored ventilation, respiratory function stable and patient connected to nasal cannula oxygen Cardiovascular status: blood pressure returned to baseline and stable Postop Assessment: no apparent nausea or vomiting Anesthetic complications: no     Last Vitals:  Vitals:   01/17/19 1640 01/17/19 1653  BP: 133/88 135/84  Pulse: 87 92  Resp: 13 16  Temp: 36.4 C 36.7 C  SpO2: 93% 94%    Last Pain:  Vitals:   01/18/19 0821  TempSrc:   PainSc: 0-No pain                 Freada Twersky S

## 2019-01-24 ENCOUNTER — Other Ambulatory Visit: Payer: 59

## 2019-01-24 ENCOUNTER — Other Ambulatory Visit: Payer: Self-pay

## 2019-01-24 ENCOUNTER — Telehealth: Payer: Self-pay | Admitting: Urology

## 2019-01-24 DIAGNOSIS — Z01818 Encounter for other preprocedural examination: Secondary | ICD-10-CM | POA: Diagnosis not present

## 2019-01-24 DIAGNOSIS — N201 Calculus of ureter: Secondary | ICD-10-CM | POA: Diagnosis not present

## 2019-01-24 LAB — URINALYSIS, COMPLETE
Bilirubin, UA: NEGATIVE
Glucose, UA: NEGATIVE
Ketones, UA: NEGATIVE
Nitrite, UA: NEGATIVE
Specific Gravity, UA: 1.01 (ref 1.005–1.030)
Urobilinogen, Ur: 0.2 mg/dL (ref 0.2–1.0)
pH, UA: 7 (ref 5.0–7.5)

## 2019-01-24 LAB — MICROSCOPIC EXAMINATION
Bacteria, UA: NONE SEEN
Epithelial Cells (non renal): NONE SEEN /hpf (ref 0–10)

## 2019-01-25 ENCOUNTER — Emergency Department: Payer: 59

## 2019-01-25 ENCOUNTER — Encounter: Payer: Self-pay | Admitting: Emergency Medicine

## 2019-01-25 ENCOUNTER — Telehealth: Payer: Self-pay | Admitting: Urology

## 2019-01-25 ENCOUNTER — Other Ambulatory Visit: Payer: Self-pay

## 2019-01-25 ENCOUNTER — Inpatient Hospital Stay
Admission: EM | Admit: 2019-01-25 | Discharge: 2019-01-28 | DRG: 445 | Disposition: A | Discharge status: Home / Self Care | Payer: 59 | Attending: Internal Medicine | Admitting: Internal Medicine

## 2019-01-25 DIAGNOSIS — E785 Hyperlipidemia, unspecified: Secondary | ICD-10-CM | POA: Diagnosis present

## 2019-01-25 DIAGNOSIS — R1013 Epigastric pain: Secondary | ICD-10-CM | POA: Diagnosis not present

## 2019-01-25 DIAGNOSIS — E7800 Pure hypercholesterolemia, unspecified: Secondary | ICD-10-CM | POA: Diagnosis not present

## 2019-01-25 DIAGNOSIS — Z8719 Personal history of other diseases of the digestive system: Secondary | ICD-10-CM

## 2019-01-25 DIAGNOSIS — Z8051 Family history of malignant neoplasm of kidney: Secondary | ICD-10-CM

## 2019-01-25 DIAGNOSIS — K7460 Unspecified cirrhosis of liver: Secondary | ICD-10-CM | POA: Diagnosis not present

## 2019-01-25 DIAGNOSIS — J302 Other seasonal allergic rhinitis: Secondary | ICD-10-CM | POA: Diagnosis not present

## 2019-01-25 DIAGNOSIS — Z800 Family history of malignant neoplasm of digestive organs: Secondary | ICD-10-CM | POA: Diagnosis not present

## 2019-01-25 DIAGNOSIS — K810 Acute cholecystitis: Secondary | ICD-10-CM

## 2019-01-25 DIAGNOSIS — R079 Chest pain, unspecified: Secondary | ICD-10-CM | POA: Diagnosis not present

## 2019-01-25 DIAGNOSIS — D6959 Other secondary thrombocytopenia: Secondary | ICD-10-CM | POA: Diagnosis present

## 2019-01-25 DIAGNOSIS — N200 Calculus of kidney: Secondary | ICD-10-CM | POA: Diagnosis not present

## 2019-01-25 DIAGNOSIS — I8510 Secondary esophageal varices without bleeding: Secondary | ICD-10-CM | POA: Diagnosis present

## 2019-01-25 DIAGNOSIS — E039 Hypothyroidism, unspecified: Secondary | ICD-10-CM | POA: Diagnosis present

## 2019-01-25 DIAGNOSIS — K766 Portal hypertension: Secondary | ICD-10-CM | POA: Diagnosis not present

## 2019-01-25 DIAGNOSIS — Z20828 Contact with and (suspected) exposure to other viral communicable diseases: Secondary | ICD-10-CM | POA: Diagnosis not present

## 2019-01-25 DIAGNOSIS — R161 Splenomegaly, not elsewhere classified: Secondary | ICD-10-CM | POA: Diagnosis present

## 2019-01-25 DIAGNOSIS — Z79899 Other long term (current) drug therapy: Secondary | ICD-10-CM

## 2019-01-25 DIAGNOSIS — Z960 Presence of urogenital implants: Secondary | ICD-10-CM | POA: Diagnosis present

## 2019-01-25 DIAGNOSIS — K7581 Nonalcoholic steatohepatitis (NASH): Secondary | ICD-10-CM | POA: Diagnosis present

## 2019-01-25 DIAGNOSIS — R1011 Right upper quadrant pain: Secondary | ICD-10-CM | POA: Diagnosis not present

## 2019-01-25 DIAGNOSIS — D696 Thrombocytopenia, unspecified: Secondary | ICD-10-CM | POA: Diagnosis not present

## 2019-01-25 DIAGNOSIS — K819 Cholecystitis, unspecified: Secondary | ICD-10-CM

## 2019-01-25 DIAGNOSIS — Z03818 Encounter for observation for suspected exposure to other biological agents ruled out: Secondary | ICD-10-CM | POA: Diagnosis not present

## 2019-01-25 DIAGNOSIS — K8020 Calculus of gallbladder without cholecystitis without obstruction: Secondary | ICD-10-CM | POA: Diagnosis not present

## 2019-01-25 DIAGNOSIS — K8000 Calculus of gallbladder with acute cholecystitis without obstruction: Principal | ICD-10-CM | POA: Diagnosis present

## 2019-01-25 LAB — HEPATIC FUNCTION PANEL
ALT: 25 U/L (ref 0–44)
AST: 30 U/L (ref 15–41)
Albumin: 3.8 g/dL (ref 3.5–5.0)
Alkaline Phosphatase: 73 U/L (ref 38–126)
Bilirubin, Direct: 0.5 mg/dL — ABNORMAL HIGH (ref 0.0–0.2)
Indirect Bilirubin: 2.4 mg/dL — ABNORMAL HIGH (ref 0.3–0.9)
Total Bilirubin: 2.9 mg/dL — ABNORMAL HIGH (ref 0.3–1.2)
Total Protein: 7.1 g/dL (ref 6.5–8.1)

## 2019-01-25 LAB — BASIC METABOLIC PANEL
Anion gap: 9 (ref 5–15)
BUN: 17 mg/dL (ref 6–20)
CO2: 22 mmol/L (ref 22–32)
Calcium: 9.2 mg/dL (ref 8.9–10.3)
Chloride: 105 mmol/L (ref 98–111)
Creatinine, Ser: 0.77 mg/dL (ref 0.61–1.24)
GFR calc Af Amer: >60 mL/min (ref >60)
GFR calc non Af Amer: >60 mL/min (ref >60)
Glucose, Bld: 147 mg/dL — ABNORMAL HIGH (ref 70–99)
Potassium: 4.3 mmol/L (ref 3.5–5.1)
Sodium: 136 mmol/L (ref 135–145)

## 2019-01-25 LAB — URINALYSIS, COMPLETE (UACMP) WITH MICROSCOPIC
Bacteria, UA: NONE SEEN
Bilirubin Urine: NEGATIVE
Glucose, UA: NEGATIVE mg/dL
Ketones, ur: NEGATIVE mg/dL
Nitrite: NEGATIVE
Protein, ur: 30 mg/dL — AB
RBC / HPF: >50 RBC/hpf — ABNORMAL HIGH (ref 0–5)
Specific Gravity, Urine: 1.013 (ref 1.005–1.030)
Squamous Epithelial / HPF: NONE SEEN (ref 0–5)
pH: 8 (ref 5.0–8.0)

## 2019-01-25 LAB — CBC
HCT: 44.8 % (ref 39.0–52.0)
Hemoglobin: 15.7 g/dL (ref 13.0–17.0)
MCH: 33.2 pg (ref 26.0–34.0)
MCHC: 35 g/dL (ref 30.0–36.0)
MCV: 94.7 fL (ref 80.0–100.0)
Platelets: 124 10*3/uL — ABNORMAL LOW (ref 150–400)
RBC: 4.73 MIL/uL (ref 4.22–5.81)
RDW: 12.2 % (ref 11.5–15.5)
WBC: 16.9 10*3/uL — ABNORMAL HIGH (ref 4.0–10.5)
nRBC: 0 % (ref 0.0–0.2)

## 2019-01-25 LAB — PROTIME-INR
INR: 1.4 — ABNORMAL HIGH (ref 0.8–1.2)
Prothrombin Time: 16.5 seconds — ABNORMAL HIGH (ref 11.4–15.2)

## 2019-01-25 LAB — TROPONIN I (HIGH SENSITIVITY)
Troponin I (High Sensitivity): 7 ng/L (ref <18)
Troponin I (High Sensitivity): 6 ng/L (ref <18)

## 2019-01-25 LAB — LIPASE, BLOOD: Lipase: 25 U/L (ref 11–51)

## 2019-01-25 LAB — GLUCOSE, CAPILLARY: Glucose-Capillary: 115 mg/dL — ABNORMAL HIGH (ref 70–99)

## 2019-01-25 LAB — APTT: aPTT: 33 seconds (ref 24–36)

## 2019-01-25 LAB — SARS CORONAVIRUS 2 BY RT PCR (HOSPITAL ORDER, PERFORMED IN ~~LOC~~ HOSPITAL LAB): SARS Coronavirus 2: NEGATIVE

## 2019-01-25 MED ORDER — PIPERACILLIN-TAZOBACTAM 3.375 G IVPB
3.3750 g | Freq: Three times a day (TID) | INTRAVENOUS | Status: DC
  Administered 2019-01-26 – 2019-01-28 (×8): 3.375 g via INTRAVENOUS
  Filled 2019-01-25 (×8): qty 50

## 2019-01-25 MED ORDER — SODIUM CHLORIDE 0.9% FLUSH
3.0000 mL | Freq: Once | INTRAVENOUS | Status: AC
  Administered 2019-01-25: 3 mL via INTRAVENOUS

## 2019-01-25 MED ORDER — ONDANSETRON HCL 4 MG/2ML IJ SOLN
4.0000 mg | Freq: Once | INTRAMUSCULAR | Status: AC
  Administered 2019-01-25: 17:00:00 4 mg via INTRAVENOUS
  Filled 2019-01-25: qty 2

## 2019-01-25 MED ORDER — MORPHINE SULFATE (PF) 4 MG/ML IV SOLN
4.0000 mg | Freq: Once | INTRAVENOUS | Status: AC
  Administered 2019-01-25: 4 mg via INTRAVENOUS
  Filled 2019-01-25: qty 1

## 2019-01-25 MED ORDER — ONDANSETRON HCL 4 MG PO TABS: 4.0000 mg | ORAL_TABLET | Freq: Four times a day (QID) | ORAL | Status: DC | PRN

## 2019-01-25 MED ORDER — IOHEXOL 300 MG/ML  SOLN
100.0000 mL | Freq: Once | INTRAMUSCULAR | Status: AC | PRN
  Administered 2019-01-25: 100 mL via INTRAVENOUS

## 2019-01-25 MED ORDER — PIPERACILLIN-TAZOBACTAM 3.375 G IVPB 30 MIN
3.3750 g | Freq: Once | INTRAVENOUS | Status: AC
  Administered 2019-01-25: 3.375 g via INTRAVENOUS
  Filled 2019-01-25: qty 50

## 2019-01-25 MED ORDER — SODIUM CHLORIDE 0.9 % IV SOLN
INTRAVENOUS | Status: AC
  Administered 2019-01-25 – 2019-01-26 (×3): via INTRAVENOUS

## 2019-01-25 MED ORDER — ONDANSETRON HCL 4 MG/2ML IJ SOLN
4.0000 mg | Freq: Once | INTRAMUSCULAR | Status: AC
  Administered 2019-01-25: 4 mg via INTRAVENOUS
  Filled 2019-01-25: qty 2

## 2019-01-25 MED ORDER — MORPHINE SULFATE (PF) 2 MG/ML IV SOLN
1.0000 mg | INTRAVENOUS | Status: DC | PRN
  Filled 2019-01-25: qty 1

## 2019-01-25 MED ORDER — IOHEXOL 9 MG/ML PO SOLN
500.0000 mL | ORAL | Status: AC | PRN
  Administered 2019-01-25 (×2): 500 mL via ORAL
  Filled 2019-01-25 (×2): qty 500

## 2019-01-25 MED ORDER — SODIUM CHLORIDE 0.9 % IV BOLUS
1000.0000 mL | Freq: Once | INTRAVENOUS | Status: AC
  Administered 2019-01-25: 1000 mL via INTRAVENOUS

## 2019-01-25 MED ORDER — PANTOPRAZOLE SODIUM 40 MG IV SOLR
40.0000 mg | INTRAVENOUS | Status: DC
  Administered 2019-01-25 – 2019-01-26 (×2): 40 mg via INTRAVENOUS
  Filled 2019-01-25 (×2): qty 40

## 2019-01-25 MED ORDER — KETOROLAC TROMETHAMINE 15 MG/ML IJ SOLN
15.0000 mg | Freq: Once | INTRAMUSCULAR | Status: AC
  Administered 2019-01-25: 15 mg via INTRAVENOUS
  Filled 2019-01-25: qty 1

## 2019-01-25 MED ORDER — ONDANSETRON HCL 4 MG/2ML IJ SOLN
4.0000 mg | Freq: Four times a day (QID) | INTRAMUSCULAR | Status: DC | PRN
  Administered 2019-01-25: 4 mg via INTRAVENOUS
  Filled 2019-01-25: qty 2

## 2019-01-25 MED ORDER — MORPHINE SULFATE (PF) 2 MG/ML IV SOLN
2.0000 mg | INTRAVENOUS | Status: DC | PRN
  Administered 2019-01-25 – 2019-01-26 (×5): 2 mg via INTRAVENOUS
  Filled 2019-01-25 (×3): qty 1

## 2019-01-25 NOTE — Progress Notes (Signed)
Family Meeting Note  Advance Directive:yes  Today a meeting took place with the Patient.    The following clinical team members were present during this meeting:MD  The following were discussed:Patient's diagnosis: Acute cholecystitis with cholelithiasis, acute abdominal pain, thrombocytopenia, liver cirrhosis with unclear etiology will be admitted to the hospital.  Interventional radiologist will place cholecystostomy tube tomorrow.  Treatment plan of care discussed in detail with the patient and his wife at bedside.  They both verbalized understanding of the plan   patient's progosis: Unable to determine and Goals for treatment: Full Code  Wife healthcare power of attorney  Additional follow-up to be provided: Hospitalist, gastroenterology and surgery  Time spent during discussion:17 min  Nicholes Mango, MD

## 2019-01-25 NOTE — Consult Note (Signed)
Pharmacy Antibiotic Note  Ryan Willis. is a 58 y.o. male admitted on 01/25/2019 with Intra-abdominal Infection.  Pharmacy has been consulted for Zosyn dosing.  Plan: 1. Zosyn 3.375 g Q8H (extended infusion) will start @ 0000.   Height: 5\' 6"  (167.6 cm) Weight: 187 lb 12.6 oz (85.2 kg) IBW/kg (Calculated) : 63.8  Temp (24hrs), Avg:98.2 F (36.8 C), Min:98.2 F (36.8 C), Max:98.2 F (36.8 C)  Recent Labs  Lab 01/25/19 0913  WBC 16.9*  CREATININE 0.77    Estimated Creatinine Clearance: 103.1 mL/min (by C-G formula based on SCr of 0.77 mg/dL).    No Known Allergies  Antimicrobials this admission: 9/29 Zosyn >>   Microbiology results: 9/29 UCx: pending    Thank you for allowing pharmacy to be a part of this patient's care.  Rowland Lathe 01/25/2019 5:34 PM

## 2019-01-25 NOTE — Telephone Encounter (Signed)
Spoke with patient and he confirmed he has been having chest pain for two days and worsening , he vomited once this morning. He was told to go to the ER for further evaluation

## 2019-01-25 NOTE — H&P (Signed)
[facility] NAME: Ryan Willis    MR#:  [id number]  DATE OF BIRTH:  [birth year]  DATE OF ADMISSION:  01/25/2019  PRIMARY CARE PHYSICIAN: Patient, No Pcp Per   REQUESTING/REFERRING PHYSICIAN: Harvest Dark, MD  CHIEF COMPLAINT:  Abdominal pain with nausea  HISTORY OF PRESENT ILLNESS:  Ryan Willis  is a 58 y.o. male with a known history of kidney stones, recently seen by Dr. Erlene Quan on 921 for right ureteropelvic junction stone had cystoscopy and stent placement, hyperlipidemia, hypothyroidism, hyperlipidemia and other medical problems is presenting to the ED with a chief complaint of right upper quadrant pain associated with nausea, denies any vomiting or diarrhea.  Patient is reporting 1 week history of blood in his urine.  Wife at bedside.  Hospitalist team is called admit the patient patient was already evaluated by surgery Dr. Dahlia Byes who is not considering cholecystectomy at this point of time  PAST MEDICAL HISTORY:   Past Medical History:  Diagnosis Date  . Diverticulitis   . History of kidney stones   . Hypercholesteremia   . Hypothyroidism   . Peyronie's disease   . Renal cyst, left   . Seasonal allergies   . Thrombocytopenia ([place])     PAST SURGICAL HISTOIRY:   Past Surgical History:  Procedure Laterality Date  . COLONOSCOPY  2006  . COLONOSCOPY WITH PROPOFOL N/A 09/08/2017   Procedure: COLONOSCOPY WITH PROPOFOL;  Surgeon: [name], MD;  Location: [facility] ENDOSCOPY;  Service: Gastroenterology;  Laterality: N/A;  . COLONOSCOPY WITH PROPOFOL N/A 01/01/2018   Procedure: COLONOSCOPY WITH PROPOFOL;  Surgeon: [name], MD;  Location: [facility] ENDOSCOPY;  Service: Gastroenterology;  Laterality: N/A;  . CYSTOSCOPY/URETEROSCOPY/HOLMIUM LASER/STENT PLACEMENT Right 01/17/2019   Procedure: CYSTOSCOPY/URETEROSCOPY/STENT PLACEMENT;  Surgeon: Hollice Espy, MD;  Location: [facility] ORS;  Service: Urology;  Laterality: Right;  .  ESOPHAGOGASTRODUODENOSCOPY (EGD) WITH PROPOFOL N/A 09/08/2017   Procedure: ESOPHAGOGASTRODUODENOSCOPY (EGD) WITH PROPOFOL;  Surgeon: [name], MD;  Location: [facility] ENDOSCOPY;  Service: Gastroenterology;  Laterality: N/A;  . HERNIA REPAIR  1981  . SHOULDER SURGERY Right 2001    SOCIAL HISTORY:   Social History   Tobacco Use  . Smoking status: Never Smoker  . Smokeless tobacco: Never Used  Substance Use Topics  . Alcohol use: Not Currently    Comment: "1 or 2 year drinks a year."    FAMILY HISTORY:   Family History  Problem Relation Age of Onset  . Kidney cancer Mother   . Colon cancer Father     DRUG ALLERGIES:  No Known Allergies  REVIEW OF SYSTEMS:  CONSTITUTIONAL: No fever, fatigue or weakness.  EYES: No blurred or double vision.  EARS, NOSE, AND THROAT: No tinnitus or ear pain.  RESPIRATORY: No cough, shortness of breath, wheezing or hemoptysis.  CARDIOVASCULAR: No chest pain, orthopnea, edema.  GASTROINTESTINAL: Positive nausea, denies vomiting, diarrhea, patient has right-sided abdominal pain.  GENITOURINARY: No dysuria, hematuria.  ENDOCRINE: No polyuria, nocturia,  HEMATOLOGY: No anemia, easy bruising or bleeding SKIN: No rash or lesion. MUSCULOSKELETAL: No joint pain or arthritis.   NEUROLOGIC: No tingling, numbness, weakness.  PSYCHIATRY: No anxiety or depression.   MEDICATIONS AT HOME:   Prior to Admission medications   Medication Sig Start Date End Date Taking? Authorizing Provider  docusate sodium (COLACE) 100 MG capsule Take 1 capsule (100 mg total) by mouth 2 (two) times daily. 01/17/19  Yes Hollice Espy, MD  HYDROcodone-acetaminophen (NORCO/VICODIN) 5-325 MG tablet Take 1-2 tablets  by mouth every 6 (six) hours as needed for moderate pain. 01/17/19  Yes Hollice Espy, MD  oxybutynin (DITROPAN) 5 MG tablet Take 1 tablet (5 mg total) by mouth every 8 (eight) hours as needed for bladder spasms. 01/17/19  Yes Hollice Espy, MD  tamsulosin (FLOMAX) 0.4  MG CAPS capsule Take 1 capsule (0.4 mg total) by mouth daily. 01/17/19  Yes Hollice Espy, MD  Multiple Vitamin (MULTIVITAMIN WITH MINERALS) TABS tablet Take 1 tablet by mouth daily. One-A-Day Multivitamin    [provider]  oxyCODONE-acetaminophen (PERCOCET) 5-325 MG tablet Take 1 tablet by mouth every 4 (four) hours as needed. Patient not taking: Reported on 01/13/2019 01/12/19 01/12/20  Gregor Hams, MD  Soft Lens Products (REWETTING DROPS) SOLN Place 1 drop into both eyes daily.    [provider]      VITAL SIGNS:  Blood pressure 130/82, pulse (!) 102, temperature 98.2 F (36.8 C), temperature source Oral, resp. rate 16, height 5' 6"  (1.676 m), weight 85.2 kg, SpO2 91 %.  PHYSICAL EXAMINATION:  GENERAL:  58 y.o.-year-old patient lying in the bed with no acute distress.  EYES: Pupils equal, round, reactive to light and accommodation. No scleral icterus. Extraocular muscles intact.  HEENT: Head atraumatic, normocephalic. Oropharynx and nasopharynx clear.  NECK:  Supple, no jugular venous distention. No thyroid enlargement, no tenderness.  LUNGS: Normal breath sounds bilaterally, no wheezing, rales,rhonchi or crepitation. No use of accessory muscles of respiration.  CARDIOVASCULAR: S1, S2 normal. No murmurs, rubs, or gallops.  ABDOMEN: Right upper quadrant abdominal is tender no rebound tenderness bowel sounds are positive EXTREMITIES: No pedal edema, cyanosis, or clubbing.  NEUROLOGIC: Cranial nerves II through XII are intact. Muscle strength 5/5 in all extremities. Sensation intact. Gait not checked.  PSYCHIATRIC: The patient is alert and oriented x 3.  SKIN: No obvious rash, lesion, or ulcer.   LABORATORY PANEL:   CBC Recent Labs  Lab 01/25/19 0913  WBC 16.9*  HGB 15.7  HCT 44.8  PLT 124*   ------------------------------------------------------------------------------------------------------------------  Chemistries  Recent Labs  Lab 01/25/19 0913  01/25/19 1143  NA 136  --   K 4.3  --   CL 105  --   CO2 22  --   GLUCOSE 147*  --   BUN 17  --   CREATININE 0.77  --   CALCIUM 9.2  --   AST  --  30  ALT  --  25  ALKPHOS  --  73  BILITOT  --  2.9*   ------------------------------------------------------------------------------------------------------------------  Cardiac Enzymes No results for input(s): TROPONINI in the last 168 hours. ------------------------------------------------------------------------------------------------------------------  RADIOLOGY:  Dg Chest 2 View  Result Date: 01/25/2019 CLINICAL DATA:  Chest pain EXAM: CHEST - 2 VIEW COMPARISON:  Overlapping portions of CT abdomen from 01/12/2019. FINDINGS: The lungs appear clear. Cardiac and mediastinal margins appear normal. No blunting of the costophrenic angles. Mild thoracic spondylosis. Old deformity from prior fracture left mid clavicle. IMPRESSION: 1. A cause for the patient's chest pain is not identified. Electronically Signed   By: Van Clines M.D.   On: 01/25/2019 09:42   Ct Abdomen Pelvis W Contrast  Result Date: 01/25/2019 CLINICAL DATA:  59 year old presenting with epigastric and RIGHT UPPER QUADRANT abdominal pain which began 2 nights ago and has been constant since approximately 7 o'clock p.m. last night. Current history of urinary tract calculi with a RIGHT double-J ureteral stent in place. EXAM: CT ABDOMEN AND PELVIS WITH CONTRAST TECHNIQUE: Multidetector CT imaging of the  abdomen and pelvis was performed using the standard protocol following bolus administration of intravenous contrast. CONTRAST:  130m OMNIPAQUE IOHEXOL 300 MG/ML IV. COMPARISON:  01/12/2019 and earlier. FINDINGS: Lower chest: Small hiatal hernia containing intra-abdominal fat, minimal fluid, and esophageal varices, unchanged since the examination 2 weeks ago. Stable upper normal heart size. Hepatobiliary: Enlargement of the LEFT lobe and caudate lobe relative to the RIGHT lobe  as noted previously. No focal hepatic parenchymal abnormality. Adjacent gallstones in the neck of the gallbladder, the largest measuring approximately 7 mm, with mild gallbladder distention and moderate to severe pericholecystic edema/inflammation. No biliary ductal dilation. Pancreas: Normal in appearance without evidence of mass, ductal dilation, or inflammation. While some of the pericholecystic edema/inflammation is present adjacent to the head of the pancreas, I do not believe that the edema/inflammation is related to pancreatitis. Spleen: Massively enlarged as noted previously. Small hypoenhancing foci in the spleen as noted previously. No new abnormalities. Adrenals/Urinary Tract: Normal appearing adrenal glands. Numerous BILATERAL renal calculi, the largest in a mid to LOWER pole infundibulum on the RIGHT measuring approximately 9 mm and in a LOWER pole calyx of the LEFT kidney measuring approximately 9 mm. RIGHT double-J ureteral stent in place. No visible RIGHT ureteral calculus. No LEFT ureteral calculus. No hydronephrosis involving either kidney. Scattered benign cortical cysts involving both kidneys as noted previously. Large benign exophytic cyst arising from the LOWER pole of the LEFT kidney measuring approximately 6.1 x 6.7 cm. Normal appearing urinary bladder. Stomach/Bowel: Stomach normal in appearance for the degree of distention. Mild wall thickening involving the descending duodenum, likely secondary inflammation. Small bowel normal in appearance otherwise. Diffuse colonic diverticula, most severe in the sigmoid colon, without evidence of acute diverticulitis. Normal appendix in the RIGHT UPPER pelvis. Vascular/Lymphatic: Mild aortic atherosclerosis without evidence of aneurysm. Normal systemic venous circulation. Patent and enlarged splenic vein and portal vein. Esophageal varices. Recanalized umbilical vein. No pathologic lymphadenopathy. Reproductive: Marked prostate gland enlargement,  particularly the median lobe. Calcifications involving the seminal vesicles. Other: Small amount of free fluid in the RIGHT anterior pararenal space and in the RIGHT paracolic gutter. Minimal dependent ascites in the pelvis. Musculoskeletal: Mild diffuse degenerative disc disease and spondylosis throughout the visualized thoracic spine and lumbar spine. No acute findings. IMPRESSION: 1. Cholelithiasis with adjacent stones in the gallbladder neck. Severe acute cholecystitis. No evidence of biliary ductal dilation. 2. Small hiatal hernia containing intra-abdominal fat, minimal fluid, and esophageal varices. 3. Small amount of ascites in the RIGHT anterior pararenal space, the RIGHT paracolic gutter and in the pelvis, related to the acute cholecystitis. 4. Mild wall thickening involving the descending duodenum, likely secondary inflammation. 5. Hepatic cirrhosis with evidence of portal venous hypertension (splenomegaly, esophageal varices, and recanalized umbilical vein). 6. Numerous BILATERAL renal calculi. RIGHT ureteral stent in place without evidence of hydronephrosis. 7. Diffuse colonic diverticula without evidence of acute diverticulitis. 8. Marked prostate gland enlargement, particularly the median lobe. Aortic Atherosclerosis (ICD10-I70.0). Electronically Signed   By: TEvangeline DakinM.D.   On: 01/25/2019 14:14   UKoreaAbdomen Limited Ruq  Result Date: 01/25/2019 CLINICAL DATA:  Onset right lower quadrant pain last night at 7 p.m. EXAM: ULTRASOUND ABDOMEN LIMITED RIGHT UPPER QUADRANT COMPARISON:  CT abdomen and pelvis earlier today. FINDINGS: Gallbladder: The gallbladder is distended and filled with sludge. Gallbladder wall thickness is increased at 0.5 cm and there is some pericholecystic fluid. Stones in the neck of the gallbladder seen on prior CT are not visible on this exam no focal lesion.  Common bile duct: Diameter: 0.6 cm Liver: Echogenicity is increased. Subtle nodularity of the liver border is  noted. Portal vein is patent on color Doppler imaging with normal direction of blood flow towards the liver. Other: Small volume of perihepatic ascites is seen. IMPRESSION: Small gallstones seen on the CT scan earlier today are not visible on this study. The gallbladder is filled with sludge and there is pericholecystic fluid and gallbladder wall thickening most worrisome for acute cholecystitis. Findings could be secondary to cirrhosis and ascites but this is thought less likely. Cirrhosis and small volume of ascites. Electronically Signed   By: Inge Rise M.D.   On: 01/25/2019 16:02    EKG:   Orders placed or performed during the hospital encounter of 01/25/19  . ED EKG  . ED EKG    IMPRESSION AND PLAN:    #Acute right-sided abdominal pain from acute cholecystitis Admit to MedSurg unit N.p.o., IV fluids and IV Zosyn as recommended by surgery Patient was seen and evaluated by surgery Dr. Dahlia Byes who is not considering surgery at this time given the history of liver cirrhosis and thrombocytopenia Interventional radiology consult placed by surgery for cholecystostomy tube placement in a.m. GI consult placed notify Dr. Marius Ditch Pain management as needed  #Liver cirrhosis unclear etiology Monitor LFTs and platelet count GI consult placed and notify Dr. Marius Ditch  #Hypothyroidism Patient is not on any Synthyroid we will check his TSH  #Hyperlipidemia hold cholesterol medicine Currently patient is n.p.o.   DVT prophylaxis with SCDs    All the records are reviewed and case discussed with ED provider. Management plans discussed with the patient, family and they are in agreement.  CODE STATUS:  fc   TOTAL TIME TAKING CARE OF THIS PATIENT: 45  minutes.   Note: This dictation was prepared with Dragon dictation along with smaller phrase technology. Any transcriptional errors that result from this process are unintentional.  Nicholes Mango M.D on 01/25/2019 at 5:44 PM  Between 7am to 6pm  - Pager - [phone]  After 6pm go to [email or web address] - password [email or web address] [facility] [place] [facility]  Office  [phone]  CC: Primary care physician; Patient, No Pcp Per

## 2019-01-25 NOTE — ED Triage Notes (Signed)
Pain across upper abdomen/lower chest night before last and then it came back 7p last night and ididnt go away.  Has kidney stone with stent in now.

## 2019-01-25 NOTE — ED Notes (Signed)
Transported pt to rm [id number]; Sport and exercise psychologist met pt in rm.

## 2019-01-25 NOTE — Consult Note (Signed)
[facility] SURGICAL CONSULTATION NOTE (initial) - cpt: 94801 (Outpatient/ED)   HISTORY OF PRESENT ILLNESS (HPI):  58 y.o. male presented to [facility] ED today for evaluation of abdominal pain. Patient reports the onset of upper abdominal pain which has not subsided since the onset. He described this as a sharp aching pain. Nothing seems to make it better. He thought it may have been attributed to recent stent placement with urology but his post-op pain from that has improved. No fever, chills, nausea, or emesis. He denied any history of similar pain in the past. No previous abdominal surgeries aside from hernia repair in 1981. Of note, he does have a history of cirrhosis (NAFLD), splenomegaly, and thrombocytopenia. He is not on medications for this and follow with Dr Vicente Males and Dr Burlene Arnt. Work up in the ED was concerning for leukocytosis to 16K, thrombocytopenia to 124 which is above his baseline and likely reactive, and concern for cholecystitis on imaging studies.    Surgery is consulted by emergency medicine physician Dr. Harvest Dark, MD in this context for evaluation and management of acute cholecystitis in the setting of cirrhosis, splenomegaly, and thrombocytopenia.   PAST MEDICAL HISTORY (PMH):  Past Medical History:  Diagnosis Date  . Diverticulitis   . History of kidney stones   . Hypercholesteremia   . Hypothyroidism   . Peyronie's disease   . Renal cyst, left   . Seasonal allergies   . Thrombocytopenia ([place])      PAST SURGICAL HISTORY ([place]):  Past Surgical History:  Procedure Laterality Date  . COLONOSCOPY  2006  . COLONOSCOPY WITH PROPOFOL N/A 09/08/2017   Procedure: COLONOSCOPY WITH PROPOFOL;  Surgeon: [name], MD;  Location: [facility] ENDOSCOPY;  Service: Gastroenterology;  Laterality: N/A;  . COLONOSCOPY WITH PROPOFOL N/A 01/01/2018   Procedure: COLONOSCOPY WITH PROPOFOL;  Surgeon: [name], MD;  Location: [facility] ENDOSCOPY;  Service: Gastroenterology;   Laterality: N/A;  . CYSTOSCOPY/URETEROSCOPY/HOLMIUM LASER/STENT PLACEMENT Right 01/17/2019   Procedure: CYSTOSCOPY/URETEROSCOPY/STENT PLACEMENT;  Surgeon: Hollice Espy, MD;  Location: [facility] ORS;  Service: Urology;  Laterality: Right;  . ESOPHAGOGASTRODUODENOSCOPY (EGD) WITH PROPOFOL N/A 09/08/2017   Procedure: ESOPHAGOGASTRODUODENOSCOPY (EGD) WITH PROPOFOL;  Surgeon: [name], MD;  Location: [facility] ENDOSCOPY;  Service: Gastroenterology;  Laterality: N/A;  . HERNIA REPAIR  1981  . SHOULDER SURGERY Right 2001     MEDICATIONS:  Prior to Admission medications   Medication Sig Start Date End Date Taking? Authorizing Provider  docusate sodium (COLACE) 100 MG capsule Take 1 capsule (100 mg total) by mouth 2 (two) times daily. 01/17/19   Hollice Espy, MD  HYDROcodone-acetaminophen (NORCO/VICODIN) 5-325 MG tablet Take 1-2 tablets by mouth every 6 (six) hours as needed for moderate pain. 01/17/19   Hollice Espy, MD  Multiple Vitamin (MULTIVITAMIN WITH MINERALS) TABS tablet Take 1 tablet by mouth daily. One-A-Day Multivitamin    [provider]  oxybutynin (DITROPAN) 5 MG tablet Take 1 tablet (5 mg total) by mouth every 8 (eight) hours as needed for bladder spasms. 01/17/19   Hollice Espy, MD  oxyCODONE-acetaminophen (PERCOCET) 5-325 MG tablet Take 1 tablet by mouth every 4 (four) hours as needed. Patient not taking: Reported on 01/13/2019 01/12/19 01/12/20  Gregor Hams, MD  Soft Lens Products (REWETTING DROPS) SOLN Place 1 drop into both eyes daily.    [provider]  tamsulosin (FLOMAX) 0.4 MG CAPS capsule Take 1 capsule (0.4 mg total) by mouth daily. 01/17/19   Hollice Espy, MD     ALLERGIES:  No Known  Allergies   SOCIAL HISTORY:  Social History   Socioeconomic History  . Marital status: Married    Spouse name: Not on file  . Number of children: Not on file  . Years of education: Not on file  . Highest education level: Not on file  Occupational History  . Not  on file  Social Needs  . Financial resource strain: Not on file  . Food insecurity    Worry: Not on file    Inability: Not on file  . Transportation needs    Medical: Not on file    Non-medical: Not on file  Tobacco Use  . Smoking status: Never Smoker  . Smokeless tobacco: Never Used  Substance and Sexual Activity  . Alcohol use: Not Currently    Comment: "1 or 2 year drinks a year."  . Drug use: No  . Sexual activity: Not on file  Lifestyle  . Physical activity    Days per week: Not on file    Minutes per session: Not on file  . Stress: Not on file  Relationships  . Social Herbalist on phone: Not on file    Gets together: Not on file    Attends religious service: Not on file    Active member of club or organization: Not on file    Attends meetings of clubs or organizations: Not on file    Relationship status: Not on file  . Intimate partner violence    Fear of current or ex partner: Not on file    Emotionally abused: Not on file    Physically abused: Not on file    Forced sexual activity: Not on file  Other Topics Concern  . Not on file  Social History Narrative   retd. Engineer, structural; part time information specialist; never smoked; no alcohol; live in [place]. Lives with wife.      FAMILY HISTORY:  Family History  Problem Relation Age of Onset  . Kidney cancer Mother   . Colon cancer Father       REVIEW OF SYSTEMS:  Review of Systems  Constitutional: Negative for chills and fever.  HENT: Negative for congestion and sore throat.   Respiratory: Negative for cough and shortness of breath.   Cardiovascular: Negative for chest pain and palpitations.  Gastrointestinal: Positive for abdominal pain and nausea. Negative for constipation, diarrhea and vomiting.  Neurological: Negative for dizziness and headaches.  Endo/Heme/Allergies: Bruises/bleeds easily.  All other systems reviewed and are negative.   VITAL SIGNS:  Temp:  [98.2 F (36.8 C)] 98.2 F  (36.8 C) (09/29 0910) Pulse Rate:  [107] 107 (09/29 0910) Resp:  [16] 16 (09/29 0910) BP: (132)/(77) 132/77 (09/29 0910) SpO2:  [93 %] 93 % (09/29 0910) Weight:  [85.2 kg] 85.2 kg (09/29 0911)     Height: 5' 6"  (167.6 cm) Weight: 85.2 kg BMI (Calculated): 30.32   INTAKE/OUTPUT:  No intake/output data recorded.  PHYSICAL EXAM:  Physical Exam Vitals signs and nursing note reviewed.  Constitutional:      General: He is not in acute distress.    Appearance: He is well-developed. He is not ill-appearing.     Comments: Wife at bedside  HENT:     Head: Normocephalic and atraumatic.  Eyes:     General: No scleral icterus.    Conjunctiva/sclera: Conjunctivae normal.  Cardiovascular:     Rate and Rhythm: Normal rate and regular rhythm.     Pulses: Normal pulses.  Heart sounds: No murmur. No friction rub. No gallop.   Pulmonary:     Effort: Pulmonary effort is normal. No respiratory distress.     Breath sounds: Normal breath sounds. No wheezing or rhonchi.  Abdominal:     General: Abdomen is flat. There is no distension.     Palpations: Abdomen is soft.     Tenderness: There is abdominal tenderness in the right upper quadrant. Positive signs include Murphy's sign.     Hernia: No hernia is present.  Genitourinary:    Comments: Deferred Musculoskeletal:     Right lower leg: No edema.     Left lower leg: No edema.  Skin:    General: Skin is warm and dry.     Coloration: Skin is jaundiced.     Comments: He does appear to be jaundiced   Neurological:     General: No focal deficit present.     Mental Status: He is alert. He is disoriented.  Psychiatric:        Mood and Affect: Mood normal.        Behavior: Behavior normal.      Labs:  CBC Latest Ref Rng & Units 01/25/2019 01/12/2019 02/17/2018  WBC 4.0 - 10.5 K/uL 16.9(H) 4.3 3.9  Hemoglobin 13.0 - 17.0 g/dL 15.7 15.0 14.9  Hematocrit 39.0 - 52.0 % 44.8 43.0 42.8  Platelets 150 - 400 K/uL 124(L) 75(L) 92(LL)   CMP Latest  Ref Rng & Units 01/25/2019 01/12/2019 02/17/2018  Glucose 70 - 99 mg/dL 147(H) 123(H) 90  BUN 6 - 20 mg/dL 17 14 14   Creatinine 0.61 - 1.24 mg/dL 0.77 0.98 0.92  Sodium 135 - 145 mmol/L 136 141 142  Potassium 3.5 - 5.1 mmol/L 4.3 4.3 4.4  Chloride 98 - 111 mmol/L 105 107 103  CO2 22 - 32 mmol/L 22 25 26   Calcium 8.9 - 10.3 mg/dL 9.2 9.8 9.7  Total Protein 6.5 - 8.1 g/dL 7.1 7.2 7.1  Total Bilirubin 0.3 - 1.2 mg/dL 2.9(H) 2.2(H) 1.2  Alkaline Phos 38 - 126 U/L 73 78 90  AST 15 - 41 U/L 30 38 37  ALT 0 - 44 U/L 25 32 31     Imaging studies:   CT Abdomen/Pelvis (01/25/2019) personally reviewed which shows gallbladder wall thickening and pericholecystitis fluid concerning for acute cholecystitis and there is evidence of splenomegaly, and radiologist report reviewed:  IMPRESSION: 1. Cholelithiasis with adjacent stones in the gallbladder neck. Severe acute cholecystitis. No evidence of biliary ductal dilation. 2. Small hiatal hernia containing intra-abdominal fat, minimal fluid, and esophageal varices. 3. Small amount of ascites in the RIGHT anterior pararenal space, the RIGHT paracolic gutter and in the pelvis, related to the acute cholecystitis. 4. Mild wall thickening involving the descending duodenum, likely secondary inflammation. 5. Hepatic cirrhosis with evidence of portal venous hypertension (splenomegaly, esophageal varices, and recanalized umbilical vein). 6. Numerous BILATERAL renal calculi. RIGHT ureteral stent in place without evidence of hydronephrosis. 7. Diffuse colonic diverticula without evidence of acute diverticulitis. 8. Marked prostate gland enlargement, particularly the median lobe.   Assessment/Plan: (ICD-10's: K81.0) 58 y.o. male with RUQ pain and nausea attributed to acute cholecystitis however this is complicated by pertinent comorbidities including a history of NAFLD (Cirrhosis), splenomegaly, hyperbilirubinemia, and thrombocytopenia   - Given complexity  of clinical condition and comorbid dsease, will recommend admission to medicine service  - Recommend GI consultation  - NPO for now + IVF  - IV Abx (Zosyn)   - We will plan  for percutaneous cholecystostomy tube placement with IR tomorrow   - Add coags to work up; trend labs   - At some point he will benefit from laparoscopic cholecystectomy however this will likely be interval in the outpatient fashion  - pain control prn  - mobilize as toelrates    All of the above findings and recommendations were discussed with the patient and his family, and all of patient's questions were answered to his expressed satisfaction.  Thank you for the opportunity to participate in this patient's care.   -- Edison Simon, PA-C [facility] 01/25/2019, 4:16 PM [phone] M-F: 7am - 4pm

## 2019-01-25 NOTE — ED Provider Notes (Signed)
[facility] Emergency Department Provider Note  Time seen: 11:28 AM  I have reviewed the triage vital signs and the nursing notes.   HISTORY  Chief Complaint Chest Pain and Flank Pain   HPI Ryan Willis. is a 58 y.o. male with a past medical history of diverticulitis, hyperlipidemia, recent ureteral stent 1 week ago, presents to the emergency department for upper and right-sided abdominal pain.  According to the patient he has been experiencing right-sided abdominal pain since his stent was placed a little over a week ago.  He states 2 days ago he developed upper abdominal pain that went away however since last night the pain came back and has not gone away.  Describes the pain as moderate pain across the upper abdomen with moderate pain across the right abdomen.  Denies any vomiting or diarrhea.  States he has had blood in his urine x1 week but denies dysuria.  Denies fever cough or shortness of breath.   Past Medical History:  Diagnosis Date  . Diverticulitis   . History of kidney stones   . Hypercholesteremia   . Hypothyroidism   . Peyronie's disease   . Renal cyst, left   . Seasonal allergies   . Thrombocytopenia [facility])     Patient Active Problem List   Diagnosis Date Noted  . Splenomegaly 07/07/2017  . Thrombocytopenia ([place]) 06/23/2017  . Cirrhosis ([place]) 06/23/2017    Past Surgical History:  Procedure Laterality Date  . COLONOSCOPY  2006  . COLONOSCOPY WITH PROPOFOL N/A 09/08/2017   Procedure: COLONOSCOPY WITH PROPOFOL;  Surgeon: [name], MD;  Location: [facility] ENDOSCOPY;  Service: Gastroenterology;  Laterality: N/A;  . COLONOSCOPY WITH PROPOFOL N/A 01/01/2018   Procedure: COLONOSCOPY WITH PROPOFOL;  Surgeon: [name], MD;  Location: [facility] ENDOSCOPY;  Service: Gastroenterology;  Laterality: N/A;  . CYSTOSCOPY/URETEROSCOPY/HOLMIUM LASER/STENT PLACEMENT Right 01/17/2019   Procedure: CYSTOSCOPY/URETEROSCOPY/STENT PLACEMENT;  Surgeon: Hollice Espy,  MD;  Location: [facility] ORS;  Service: Urology;  Laterality: Right;  . ESOPHAGOGASTRODUODENOSCOPY (EGD) WITH PROPOFOL N/A 09/08/2017   Procedure: ESOPHAGOGASTRODUODENOSCOPY (EGD) WITH PROPOFOL;  Surgeon: [name], MD;  Location: [facility] ENDOSCOPY;  Service: Gastroenterology;  Laterality: N/A;  . HERNIA REPAIR  1981  . SHOULDER SURGERY Right 2001    Prior to Admission medications   Medication Sig Start Date End Date Taking? Authorizing Provider  docusate sodium (COLACE) 100 MG capsule Take 1 capsule (100 mg total) by mouth 2 (two) times daily. 01/17/19   Hollice Espy, MD  HYDROcodone-acetaminophen (NORCO/VICODIN) 5-325 MG tablet Take 1-2 tablets by mouth every 6 (six) hours as needed for moderate pain. 01/17/19   Hollice Espy, MD  Multiple Vitamin (MULTIVITAMIN WITH MINERALS) TABS tablet Take 1 tablet by mouth daily. One-A-Day Multivitamin    [provider]  oxybutynin (DITROPAN) 5 MG tablet Take 1 tablet (5 mg total) by mouth every 8 (eight) hours as needed for bladder spasms. 01/17/19   Hollice Espy, MD  oxyCODONE-acetaminophen (PERCOCET) 5-325 MG tablet Take 1 tablet by mouth every 4 (four) hours as needed. Patient not taking: Reported on 01/13/2019 01/12/19 01/12/20  Gregor Hams, MD  Soft Lens Products (REWETTING DROPS) SOLN Place 1 drop into both eyes daily.    [provider]  tamsulosin (FLOMAX) 0.4 MG CAPS capsule Take 1 capsule (0.4 mg total) by mouth daily. 01/17/19   Hollice Espy, MD    No Known Allergies  Family History  Problem Relation Age of Onset  . Kidney cancer Mother   . Colon cancer  Father     Social History Social History   Tobacco Use  . Smoking status: Never Smoker  . Smokeless tobacco: Never Used  Substance Use Topics  . Alcohol use: Not Currently    Comment: "1 or 2 year drinks a year."  . Drug use: No    Review of Systems Constitutional: Negative for fever. Cardiovascular: Negative for chest pain. Respiratory: Negative for  shortness of breath. Gastrointestinal: Upper abdominal pain.  Negative for vomiting or diarrhea. Genitourinary: Hematuria x1 week Musculoskeletal: Negative for musculoskeletal complaints Neurological: Negative for headache All other ROS negative  ____________________________________________   PHYSICAL EXAM:  VITAL SIGNS: ED Triage Vitals  Enc Vitals Group     BP 01/25/19 0910 132/77     Pulse Rate 01/25/19 0910 (!) 107     Resp 01/25/19 0910 16     Temp 01/25/19 0910 98.2 F (36.8 C)     Temp Source 01/25/19 0910 Oral     SpO2 01/25/19 0910 93 %     Weight 01/25/19 0911 187 lb 12.6 oz (85.2 kg)     Height 01/25/19 0911 5\' 6"  (1.676 m)     Head Circumference --      Peak Flow --      Pain Score 01/25/19 0910 7     Pain Loc --      Pain Edu? --      Excl. in [place]? --    Constitutional: Alert and oriented. Well appearing and in no distress. Eyes: Normal exam ENT      Head: Normocephalic and atraumatic.      Mouth/Throat: Mucous membranes are moist. Cardiovascular: Normal rate, regular rhythm.  Respiratory: Normal respiratory effort without tachypnea nor retractions. Breath sounds are clear Gastrointestinal: Soft, moderate epigastric right upper quadrant and right sided abdominal tenderness to palpation.  No rebound or guarding.  No distention. Musculoskeletal: Nontender with normal range of motion in all extremities.  Neurologic:  Normal speech and language. No gross focal neurologic deficits Skin:  Skin is warm, dry and intact.  Psychiatric: Mood and affect are normal.   ____________________________________________    EKG  EKG viewed and interpreted by myself shows sinus tachycardia 107 bpm with a narrow QRS, normal axis, normal intervals, nonspecific ST changes.  ____________________________________________    RADIOLOGY  Chest x-ray is negative. CT scan is concerning for acute cholecystitis.  Right ureteral stent in  place.  ____________________________________________   INITIAL IMPRESSION / ASSESSMENT AND PLAN / ED COURSE  Pertinent labs & imaging results that were available during my care of the patient were reviewed by me and considered in my medical decision making (see chart for details).   Patient presents emergency department for abdominal pain mostly epigastric and right-sided, moderate in severity.  Differential includes pancreatitis, gallbladder pathology, liver pathology, pain related to his ureteral stent or ureteral obstruction, UTI or pyelonephritis.  We will check labs including urinalysis, proceed with CT imaging.  We will dose pain and nausea medication as well as IV hydrate while awaiting results.  CT shows likely cholecystitis.  We will discuss with surgery for further work-up.  We will order Zosyn.  I have ordered an ultrasound as well.  Lab work shows a leukocytosis of 16,000 otherwise largely normal including normal LFTs with slight bilirubin elevation.  Hermann Salyers. was evaluated in Emergency Department on 01/25/2019 for the symptoms described in the history of present illness. He was evaluated in the context of the global COVID-19 pandemic, which necessitated consideration that the  patient might be at risk for infection with the SARS-CoV-2 virus that causes COVID-19. Institutional protocols and algorithms that pertain to the evaluation of patients at risk for COVID-19 are in a state of rapid change based on information released by regulatory bodies including the CDC and federal and state organizations. These policies and algorithms were followed during the patient's care in the ED.  ____________________________________________   FINAL CLINICAL IMPRESSION(S) / ED DIAGNOSES  Abdominal pain Acute cholecystitis   Harvest Dark, MD 01/25/19 1550

## 2019-01-25 NOTE — Telephone Encounter (Signed)
Pt called and states that he has been up all night in severe pain in his abdomen and moving up into his chest. He has vomitied once. His UA that he dropped off yesterday was abnormal.

## 2019-01-25 NOTE — ED Notes (Signed)
Pt uprite on stretcher in darkened exam room, eyes closed with no distress noted; pt voices good understanding of plan of care; glycerin swabs given to moisten mouth for comfort

## 2019-01-25 NOTE — ED Notes (Signed)
ED TO INPATIENT HANDOFF REPORT  ED Nurse Name and Phone #: Lattie Haw [phone] Name/Age/Gender Ryan Willis. 58 y.o. male Room/Bed: ED02A/ED02A  Code Status   Code Status: Full Code  Home/SNF/Other Home Patient oriented to: self, place, time and situation Is this baseline? Yes   Triage Complete: Triage complete  Chief Complaint flank pain  Triage Note Pain across upper abdomen/lower chest night before last and then it came back 7p last night and ididnt go away.  Has kidney stone with stent in now.   Allergies No Known Allergies  Level of Care/Admitting Diagnosis ED Disposition    ED Disposition Condition [facility] Area: [place] [100120]  Level of Care: Med-Surg [16]  Covid Evaluation: Person Under Investigation (PUI)  Diagnosis: Acute cholecystitis [575.0.ICD-9-CM]  Admitting Physician: Nicholes Mango [[id number]]  Attending Physician: Nicholes Mango [[id number]]  Estimated length of stay: 3 - 4 days  Certification:: I certify this patient will need inpatient services for at least 2 midnights  PT Class (Do Not Modify): Inpatient [101]  PT Acc Code (Do Not Modify): Private [1]       B Medical/Surgery History Past Medical History:  Diagnosis Date  . Diverticulitis   . History of kidney stones   . Hypercholesteremia   . Hypothyroidism   . Peyronie's disease   . Renal cyst, left   . Seasonal allergies   . Thrombocytopenia ([place])    Past Surgical History:  Procedure Laterality Date  . COLONOSCOPY  2006  . COLONOSCOPY WITH PROPOFOL N/A 09/08/2017   Procedure: COLONOSCOPY WITH PROPOFOL;  Surgeon: [name], MD;  Location: [facility] ENDOSCOPY;  Service: Gastroenterology;  Laterality: N/A;  . COLONOSCOPY WITH PROPOFOL N/A 01/01/2018   Procedure: COLONOSCOPY WITH PROPOFOL;  Surgeon: [name], MD;  Location: [facility] ENDOSCOPY;  Service: Gastroenterology;  Laterality: N/A;  . CYSTOSCOPY/URETEROSCOPY/HOLMIUM LASER/STENT PLACEMENT Right 01/17/2019    Procedure: CYSTOSCOPY/URETEROSCOPY/STENT PLACEMENT;  Surgeon: Hollice Espy, MD;  Location: [facility] ORS;  Service: Urology;  Laterality: Right;  . ESOPHAGOGASTRODUODENOSCOPY (EGD) WITH PROPOFOL N/A 09/08/2017   Procedure: ESOPHAGOGASTRODUODENOSCOPY (EGD) WITH PROPOFOL;  Surgeon: [name], MD;  Location: [facility] ENDOSCOPY;  Service: Gastroenterology;  Laterality: N/A;  . HERNIA REPAIR  1981  . SHOULDER SURGERY Right 2001     A IV Location/Drains/Wounds Patient Lines/Drains/Airways Status   Active Line/Drains/Airways    Name:   Placement date:   Placement time:   Site:   Days:   Peripheral IV 01/25/19 Left Antecubital   01/25/19    1143    Antecubital   less than 1   Ureteral Drain/Stent Right ureter 6 Fr.   01/17/19    1536    Right ureter   8   Incision (Closed) 01/17/19 Penis   01/17/19    1518     8          Intake/Output Last 24 hours  Intake/Output Summary (Last 24 hours) at 01/25/2019 1933 Last data filed at 01/25/2019 1701 Gross per 24 hour  Intake 1100 ml  Output -  Net 1100 ml    Labs/Imaging Results for orders placed or performed during the hospital encounter of 01/25/19 (from the past 48 hour(s))  Basic metabolic panel     Status: Abnormal   Collection Time: 01/25/19  9:13 AM  Result Value Ref Range   Sodium 136 135 - 145 mmol/L   Potassium 4.3 3.5 - 5.1 mmol/L   Chloride 105 98 - 111 mmol/L   CO2 22 22 -  32 mmol/L   Glucose, Bld 147 (H) 70 - 99 mg/dL   BUN 17 6 - 20 mg/dL   Creatinine, Ser 0.77 0.61 - 1.24 mg/dL   Calcium 9.2 8.9 - 10.3 mg/dL   GFR calc non Af Amer >60 >60 mL/min   GFR calc Af Amer >60 >60 mL/min   Anion gap 9 5 - 15    Comment: Performed at [facility], [address] [place], [place] [address]  CBC     Status: Abnormal   Collection Time: 01/25/19  9:13 AM  Result Value Ref Range   WBC 16.9 (H) 4.0 - 10.5 K/uL   RBC 4.73 4.22 - 5.81 MIL/uL   Hemoglobin 15.7 13.0 - 17.0 g/dL   HCT 44.8 39.0 - 52.0 %   MCV 94.7 80.0 - 100.0 fL    MCH 33.2 26.0 - 34.0 pg   MCHC 35.0 30.0 - 36.0 g/dL   RDW 12.2 11.5 - 15.5 %   Platelets 124 (L) 150 - 400 K/uL    Comment: Immature Platelet Fraction may be clinically indicated, consider ordering this additional test [id number]    nRBC 0.0 0.0 - 0.2 %    Comment: Performed at [facility], [place], [place] [address]  Troponin I (High Sensitivity)     Status: None   Collection Time: 01/25/19  9:13 AM  Result Value Ref Range   Troponin I (High Sensitivity) 7 <18 ng/L    Comment: (NOTE) Elevated high sensitivity troponin I (hsTnI) values and significant  changes across serial measurements may suggest ACS but many other  chronic and acute conditions are known to elevate hsTnI results.  Refer to the "Links" section for chest pain algorithms and additional  guidance. Performed at [facility], [place], [place] [address]   Troponin I (High Sensitivity)     Status: None   Collection Time: 01/25/19 11:43 AM  Result Value Ref Range   Troponin I (High Sensitivity) 6 <18 ng/L    Comment: (NOTE) Elevated high sensitivity troponin I (hsTnI) values and significant  changes across serial measurements may suggest ACS but many other  chronic and acute conditions are known to elevate hsTnI results.  Refer to the "Links" section for chest pain algorithms and additional  guidance. Performed at [facility], [address] [place], [place] [address]   Lipase, blood     Status: None   Collection Time: 01/25/19 11:43 AM  Result Value Ref Range   Lipase 25 11 - 51 U/L    Comment: Performed at [facility], [address] [place], [place] [address]  Hepatic function panel     Status: Abnormal   Collection Time: 01/25/19 11:43 AM  Result Value Ref Range   Total Protein 7.1 6.5 - 8.1 g/dL   Albumin 3.8 3.5 - 5.0 g/dL   AST 30 15 - 41 U/L   ALT 25 0 - 44 U/L   Alkaline Phosphatase 73 38 - 126 U/L   Total Bilirubin 2.9  (H) 0.3 - 1.2 mg/dL   Bilirubin, Direct 0.5 (H) 0.0 - 0.2 mg/dL   Indirect Bilirubin 2.4 (H) 0.3 - 0.9 mg/dL    Comment: Performed at [facility], [address] [place], [place] [address]  Urinalysis, Complete w Microscopic     Status: Abnormal   Collection Time: 01/25/19 12:42 PM  Result Value Ref Range   Color, Urine YELLOW (A) YELLOW   APPearance CLEAR (A)  CLEAR   Specific Gravity, Urine 1.013 1.005 - 1.030   pH 8.0 5.0 - 8.0   Glucose, UA NEGATIVE NEGATIVE mg/dL   Hgb urine dipstick LARGE (A) NEGATIVE   Bilirubin Urine NEGATIVE NEGATIVE   Ketones, ur NEGATIVE NEGATIVE mg/dL   Protein, ur 30 (A) NEGATIVE mg/dL   Nitrite NEGATIVE NEGATIVE   Leukocytes,Ua TRACE (A) NEGATIVE   RBC / HPF >50 (H) 0 - 5 RBC/hpf   WBC, UA 21-50 0 - 5 WBC/hpf   Bacteria, UA NONE SEEN NONE SEEN   Squamous Epithelial / LPF NONE SEEN 0 - 5   Mucus PRESENT     Comment: Performed at [facility], [address] [place], [place] [address]  SARS Coronavirus 2 [facility] order, Performed in [facility] hospital lab) Nasopharyngeal Nasopharyngeal Swab     Status: None   Collection Time: 01/25/19  4:33 PM   Specimen: Nasopharyngeal Swab  Result Value Ref Range   SARS Coronavirus 2 NEGATIVE NEGATIVE    Comment: (NOTE) If result is NEGATIVE SARS-CoV-2 target nucleic acids are NOT DETECTED. The SARS-CoV-2 RNA is generally detectable in upper and lower  respiratory specimens during the acute phase of infection. The lowest  concentration of SARS-CoV-2 viral copies this assay can detect is 250  copies / mL. A negative result does not preclude SARS-CoV-2 infection  and should not be used as the sole basis for treatment or other  patient management decisions.  A negative result may occur with  improper specimen collection / handling, submission of specimen other  than nasopharyngeal swab, presence of viral mutation(s) within the  areas targeted by this assay, and inadequate number of viral  copies  (<250 copies / mL). A negative result must be combined with clinical  observations, patient history, and epidemiological information. If result is POSITIVE SARS-CoV-2 target nucleic acids are DETECTED. The SARS-CoV-2 RNA is generally detectable in upper and lower  respiratory specimens dur ing the acute phase of infection.  Positive  results are indicative of active infection with SARS-CoV-2.  Clinical  correlation with patient history and other diagnostic information is  necessary to determine patient infection status.  Positive results do  not rule out bacterial infection or co-infection with other viruses. If result is PRESUMPTIVE POSTIVE SARS-CoV-2 nucleic acids MAY BE PRESENT.   A presumptive positive result was obtained on the submitted specimen  and confirmed on repeat testing.  While 2019 novel coronavirus  (SARS-CoV-2) nucleic acids may be present in the submitted sample  additional confirmatory testing may be necessary for epidemiological  and / or clinical management purposes  to differentiate between  SARS-CoV-2 and other Sarbecovirus currently known to infect humans.  If clinically indicated additional testing with an alternate test  methodology [phone]) is advised. The SARS-CoV-2 RNA is generally  detectable in upper and lower respiratory sp ecimens during the acute  phase of infection. The expected result is Negative. Fact Sheet for Patients:  StrictlyIdeas.no Fact Sheet for Healthcare Providers: BankingDealers.co.za This test is not yet approved or cleared by the [place] FDA and has been authorized for detection and/or diagnosis of SARS-CoV-2 by FDA under an Emergency Use Authorization (EUA).  This EUA will remain in effect (meaning this test can be used) for the duration of the COVID-19 declaration under Section 564(b)(1) of the Act, 21 U.S.C. section 360bbb-3(b)(1), unless the authorization is terminated  or revoked sooner. Performed at [facility], [address], [place], [place] [address]   [place]  Status: Abnormal   Collection Time: 01/25/19  4:33 PM  Result Value Ref Range   Prothrombin Time 16.5 (H) 11.4 - 15.2 seconds   INR 1.4 (H) 0.8 - 1.2    Comment: (NOTE) INR goal varies based on device and disease states. Performed at [facility], [address] [place], [place] [address]   APTT     Status: None   Collection Time: 01/25/19  4:33 PM  Result Value Ref Range   aPTT 33 24 - 36 seconds    Comment: Performed at [facility], [address] [place], [place] [address]   Dg Chest 2 View  Result Date: 01/25/2019 CLINICAL DATA:  Chest pain EXAM: CHEST - 2 VIEW COMPARISON:  Overlapping portions of CT abdomen from 01/12/2019. FINDINGS: The lungs appear clear. Cardiac and mediastinal margins appear normal. No blunting of the costophrenic angles. Mild thoracic spondylosis. Old deformity from prior fracture left mid clavicle. IMPRESSION: 1. A cause for the patient's chest pain is not identified. Electronically Signed   By: Van Clines M.D.   On: 01/25/2019 09:42   Ct Abdomen Pelvis W Contrast  Result Date: 01/25/2019 CLINICAL DATA:  58 year old presenting with epigastric and RIGHT UPPER QUADRANT abdominal pain which began 2 nights ago and has been constant since approximately 7 o'clock p.m. last night. Current history of urinary tract calculi with a RIGHT double-J ureteral stent in place. EXAM: CT ABDOMEN AND PELVIS WITH CONTRAST TECHNIQUE: Multidetector CT imaging of the abdomen and pelvis was performed using the standard protocol following bolus administration of intravenous contrast. CONTRAST:  17mL OMNIPAQUE IOHEXOL 300 MG/ML IV. COMPARISON:  01/12/2019 and earlier. FINDINGS: Lower chest: Small hiatal hernia containing intra-abdominal fat, minimal fluid, and esophageal varices, unchanged since the examination 2 weeks ago. Stable upper  normal heart size. Hepatobiliary: Enlargement of the LEFT lobe and caudate lobe relative to the RIGHT lobe as noted previously. No focal hepatic parenchymal abnormality. Adjacent gallstones in the neck of the gallbladder, the largest measuring approximately 7 mm, with mild gallbladder distention and moderate to severe pericholecystic edema/inflammation. No biliary ductal dilation. Pancreas: Normal in appearance without evidence of mass, ductal dilation, or inflammation. While some of the pericholecystic edema/inflammation is present adjacent to the head of the pancreas, I do not believe that the edema/inflammation is related to pancreatitis. Spleen: Massively enlarged as noted previously. Small hypoenhancing foci in the spleen as noted previously. No new abnormalities. Adrenals/Urinary Tract: Normal appearing adrenal glands. Numerous BILATERAL renal calculi, the largest in a mid to LOWER pole infundibulum on the RIGHT measuring approximately 9 mm and in a LOWER pole calyx of the LEFT kidney measuring approximately 9 mm. RIGHT double-J ureteral stent in place. No visible RIGHT ureteral calculus. No LEFT ureteral calculus. No hydronephrosis involving either kidney. Scattered benign cortical cysts involving both kidneys as noted previously. Large benign exophytic cyst arising from the LOWER pole of the LEFT kidney measuring approximately 6.1 x 6.7 cm. Normal appearing urinary bladder. Stomach/Bowel: Stomach normal in appearance for the degree of distention. Mild wall thickening involving the descending duodenum, likely secondary inflammation. Small bowel normal in appearance otherwise. Diffuse colonic diverticula, most severe in the sigmoid colon, without evidence of acute diverticulitis. Normal appendix in the RIGHT UPPER pelvis. Vascular/Lymphatic: Mild aortic atherosclerosis without evidence of aneurysm. Normal systemic venous circulation. Patent and enlarged splenic vein and portal vein. Esophageal varices.  Recanalized umbilical vein. No pathologic lymphadenopathy. Reproductive: Marked prostate gland enlargement, particularly the median lobe. Calcifications involving the seminal vesicles. Other:  Small amount of free fluid in the RIGHT anterior pararenal space and in the RIGHT paracolic gutter. Minimal dependent ascites in the pelvis. Musculoskeletal: Mild diffuse degenerative disc disease and spondylosis throughout the visualized thoracic spine and lumbar spine. No acute findings. IMPRESSION: 1. Cholelithiasis with adjacent stones in the gallbladder neck. Severe acute cholecystitis. No evidence of biliary ductal dilation. 2. Small hiatal hernia containing intra-abdominal fat, minimal fluid, and esophageal varices. 3. Small amount of ascites in the RIGHT anterior pararenal space, the RIGHT paracolic gutter and in the pelvis, related to the acute cholecystitis. 4. Mild wall thickening involving the descending duodenum, likely secondary inflammation. 5. Hepatic cirrhosis with evidence of portal venous hypertension (splenomegaly, esophageal varices, and recanalized umbilical vein). 6. Numerous BILATERAL renal calculi. RIGHT ureteral stent in place without evidence of hydronephrosis. 7. Diffuse colonic diverticula without evidence of acute diverticulitis. 8. Marked prostate gland enlargement, particularly the median lobe. Aortic Atherosclerosis (ICD10-I70.0). Electronically Signed   By: Evangeline Dakin M.D.   On: 01/25/2019 14:14   US Abdomen Limited Ruq  Result Date: 01/25/2019 CLINICAL DATA:  Onset right lower quadrant pain last night at 7 p.m. EXAM: ULTRASOUND ABDOMEN LIMITED RIGHT UPPER QUADRANT COMPARISON:  CT abdomen and pelvis earlier today. FINDINGS: Gallbladder: The gallbladder is distended and filled with sludge. Gallbladder wall thickness is increased at 0.5 cm and there is some pericholecystic fluid. Stones in the neck of the gallbladder seen on prior CT are not visible on this exam no focal lesion. Common  bile duct: Diameter: 0.6 cm Liver: Echogenicity is increased. Subtle nodularity of the liver border is noted. Portal vein is patent on color Doppler imaging with normal direction of blood flow towards the liver. Other: Small volume of perihepatic ascites is seen. IMPRESSION: Small gallstones seen on the CT scan earlier today are not visible on this study. The gallbladder is filled with sludge and there is pericholecystic fluid and gallbladder wall thickening most worrisome for acute cholecystitis. Findings could be secondary to cirrhosis and ascites but this is thought less likely. Cirrhosis and small volume of ascites. Electronically Signed   By: Inge Rise M.D.   On: 01/25/2019 16:02    Pending Labs Unresulted Labs (From admission, onward)    Start     Ordered   01/26/19 0500  Comprehensive metabolic panel  Tomorrow morning,   STAT     01/25/19 1929   01/26/19 0500  CBC  Tomorrow morning,   STAT     01/25/19 1929   01/26/19 0500  Protime-INR  Tomorrow morning,   STAT     01/25/19 1929   01/26/19 0500  TSH  Tomorrow morning,   STAT     01/25/19 1753   01/25/19 1930  HIV Antibody (routine testing w rflx)  (HIV Antibody (Routine testing w reflex) panel)  Once,   STAT     01/25/19 1929   01/25/19 1930  HIV4GL Save Tube  (HIV Antibody (Routine testing w reflex) panel)  Once,   STAT     01/25/19 1929   01/25/19 1132  Urine Culture  ONCE - STAT,   STAT     01/25/19 1131          Vitals/Pain Today's Vitals   01/25/19 1800 01/25/19 1830 01/25/19 1838 01/25/19 1915  BP: 120/74 118/75  118/81  Pulse: 96 93  100  Resp:    18  Temp:    98.9 F (37.2 C)  TempSrc:    Oral  SpO2: 92% 90%  93%  Weight:      Height:      PainSc:   Asleep 3     Isolation Precautions No active isolations  Medications Medications  0.9 %  sodium chloride infusion (has no administration in time range)  ondansetron (ZOFRAN) tablet 4 mg (has no administration in time range)    Or  ondansetron (ZOFRAN)  injection 4 mg (has no administration in time range)  morphine 2 MG/ML injection 1 mg (has no administration in time range)  morphine 2 MG/ML injection 2 mg (has no administration in time range)  pantoprazole (PROTONIX) injection 40 mg (40 mg Intravenous Given 01/25/19 1910)  piperacillin-tazobactam (ZOSYN) IVPB 3.375 g (has no administration in time range)  sodium chloride flush (NS) 0.9 % injection 3 mL (3 mLs Intravenous Given 01/25/19 1145)  morphine 4 MG/ML injection 4 mg (4 mg Intravenous Given 01/25/19 1145)  ondansetron (ZOFRAN) injection 4 mg (4 mg Intravenous Given 01/25/19 1145)  sodium chloride 0.9 % bolus 1,000 mL (0 mLs Intravenous Stopped 01/25/19 1631)  iohexol (OMNIPAQUE) 9 MG/ML oral solution 500 mL (500 mLs Oral Contrast Given 01/25/19 1147)  iohexol (OMNIPAQUE) 300 MG/ML solution 100 mL (100 mLs Intravenous Contrast Given 01/25/19 1346)  piperacillin-tazobactam (ZOSYN) IVPB 3.375 g (0 g Intravenous Stopped 01/25/19 1701)  morphine 4 MG/ML injection 4 mg (4 mg Intravenous Given 01/25/19 1701)  ondansetron (ZOFRAN) injection 4 mg (4 mg Intravenous Given 01/25/19 1700)    Mobility walks Low fall risk   Focused Assessments    R Recommendations: See Admitting Provider Note  Report given to:   Additional Notes: none

## 2019-01-26 ENCOUNTER — Inpatient Hospital Stay: Payer: 59

## 2019-01-26 LAB — COMPREHENSIVE METABOLIC PANEL
ALT: 20 U/L (ref 0–44)
AST: 21 U/L (ref 15–41)
Albumin: 3.1 g/dL — ABNORMAL LOW (ref 3.5–5.0)
Alkaline Phosphatase: 66 U/L (ref 38–126)
Anion gap: 9 (ref 5–15)
BUN: 18 mg/dL (ref 6–20)
CO2: 23 mmol/L (ref 22–32)
Calcium: 8.2 mg/dL — ABNORMAL LOW (ref 8.9–10.3)
Chloride: 105 mmol/L (ref 98–111)
Creatinine, Ser: 1.14 mg/dL (ref 0.61–1.24)
GFR calc Af Amer: >60 mL/min (ref >60)
GFR calc non Af Amer: >60 mL/min (ref >60)
Glucose, Bld: 113 mg/dL — ABNORMAL HIGH (ref 70–99)
Potassium: 4.6 mmol/L (ref 3.5–5.1)
Sodium: 137 mmol/L (ref 135–145)
Total Bilirubin: 3.2 mg/dL — ABNORMAL HIGH (ref 0.3–1.2)
Total Protein: 6.1 g/dL — ABNORMAL LOW (ref 6.5–8.1)

## 2019-01-26 LAB — URINE CULTURE: Culture: NO GROWTH

## 2019-01-26 LAB — CBC
HCT: 40.6 % (ref 39.0–52.0)
Hemoglobin: 13.8 g/dL (ref 13.0–17.0)
MCH: 33 pg (ref 26.0–34.0)
MCHC: 34 g/dL (ref 30.0–36.0)
MCV: 97.1 fL (ref 80.0–100.0)
Platelets: 93 10*3/uL — ABNORMAL LOW (ref 150–400)
RBC: 4.18 MIL/uL — ABNORMAL LOW (ref 4.22–5.81)
RDW: 12.8 % (ref 11.5–15.5)
WBC: 28.4 10*3/uL — ABNORMAL HIGH (ref 4.0–10.5)
nRBC: 0 % (ref 0.0–0.2)

## 2019-01-26 LAB — PROTIME-INR
INR: 1.6 — ABNORMAL HIGH (ref 0.8–1.2)
Prothrombin Time: 18.8 seconds — ABNORMAL HIGH (ref 11.4–15.2)

## 2019-01-26 LAB — CULTURE, URINE COMPREHENSIVE

## 2019-01-26 LAB — TSH: TSH: 1.983 u[IU]/mL (ref 0.350–4.500)

## 2019-01-26 LAB — HIV ANTIBODY (ROUTINE TESTING W REFLEX): HIV Screen 4th Generation wRfx: NONREACTIVE

## 2019-01-26 MED ORDER — MIDAZOLAM HCL 2 MG/2ML IJ SOLN
INTRAMUSCULAR | Status: AC | PRN
  Administered 2019-01-26 (×2): 1 mg via INTRAVENOUS

## 2019-01-26 MED ORDER — SODIUM CHLORIDE 0.9% FLUSH
5.0000 mL | Freq: Three times a day (TID) | INTRAVENOUS | Status: DC
  Administered 2019-01-26 – 2019-01-28 (×5): 5 mL

## 2019-01-26 MED ORDER — FENTANYL CITRATE (PF) 100 MCG/2ML IJ SOLN
INTRAMUSCULAR | Status: AC
  Filled 2019-01-26: qty 4

## 2019-01-26 MED ORDER — HYDROCODONE-ACETAMINOPHEN 5-325 MG PO TABS: 1.0000 | ORAL_TABLET | Freq: Four times a day (QID) | ORAL | Status: DC | PRN

## 2019-01-26 MED ORDER — OXYCODONE HCL 5 MG PO TABS
5.0000 mg | ORAL_TABLET | ORAL | Status: DC | PRN
  Administered 2019-01-26 – 2019-01-27 (×4): 5 mg via ORAL
  Filled 2019-01-26 (×4): qty 1

## 2019-01-26 MED ORDER — IBUPROFEN 400 MG PO TABS
400.0000 mg | ORAL_TABLET | Freq: Three times a day (TID) | ORAL | Status: DC | PRN
  Administered 2019-01-26 – 2019-01-27 (×2): 400 mg via ORAL
  Filled 2019-01-26 (×2): qty 1

## 2019-01-26 MED ORDER — MORPHINE SULFATE (PF) 2 MG/ML IV SOLN
INTRAVENOUS | Status: AC
  Filled 2019-01-26: qty 2

## 2019-01-26 MED ORDER — SODIUM CHLORIDE 0.9 % IV SOLN
INTRAVENOUS | Status: DC | PRN
  Administered 2019-01-26 (×2): 30 mL via INTRAVENOUS
  Administered 2019-01-26 – 2019-01-27 (×2): 250 mL via INTRAVENOUS
  Administered 2019-01-27: 30 mL via INTRAVENOUS
  Administered 2019-01-28: 50 mL/h via INTRAVENOUS

## 2019-01-26 MED ORDER — MIDAZOLAM HCL 5 MG/5ML IJ SOLN
INTRAMUSCULAR | Status: AC
  Filled 2019-01-26: qty 5

## 2019-01-26 MED ORDER — FENTANYL CITRATE (PF) 100 MCG/2ML IJ SOLN
INTRAMUSCULAR | Status: AC | PRN
  Administered 2019-01-26 (×2): 50 ug via INTRAVENOUS

## 2019-01-26 NOTE — Consult Note (Signed)
GI Inpatient Consult Note  Reason for Consult: Acute cholecystitis, NASH-cirrhosis   Attending Requesting Consult: Dr. Fritzi Mandes, MD  History of Present Illness: Ryan Willis. is a 58 y.o. male seen for evaluation of acute cholecystitis and cirrhosis 2/2 NAFLD at the request of Dr. Fritzi Mandes. He presented to the [facility] ED yesterday afternoon for acute onset epigastric and RUQ abd pain with nausea. He had RUQ [place] and CT scan yesterday which showed gallbladder sludge, pericholecystic fluid, and gallbladder wall thickening, suggesting acute cholecystitis. His LFTs were completely normal and showed no evidence of cholestasis. Imaging showed no biliary ductal dilatation. Upon presentation to the ED, he had WBC 16K, plt 124. Surgery has seen the patient and advised against any surgical intervention at this time due to his comorbidities of cirrhosis. It was advised he have a cholecystostomy tube placed by IR, which was done this afternoon for infection control purposes. He is on IV Zosyn. Of note, he did recently undergo right-sided retrograde pyelogram and ureteral stent placement for nephrolithiasis on 01/17/2019. He does report a fever of 101 degrees this afternoon, for which he is getting ibuprofen. He currently rates pain level 8/10 in severity and located in RUQ. He also endorses some hematuria which has been present over the last week and a half. He is looking forward to getting the kidney stone out. He denies any previous episodes of decompensation of his cirrhosis. Last EGD 08/2017 by Dr. Vicente Males showed no esophageal varices. CT imaging performed yesterday did show some evidence of esophageal varices. He has never had to have a paracentesis. He denies any altered mental status, lower extremity edema, abdominal swelling, vomiting, or jaundice. He denies any hematochezia or melena. In May 2020, his AFP was elevated at 11.7. He has seen by Dr. Rogue Bussing at [facility] for thrombocytopenia. MRI due to  elevated AFP was done 08/2018 which showed advanced cirrhosis with evidence of portal venous hypertension, including dilated portal vein, splenomegaly, and portosystemic collateral pathways, including dilated paraesophageal varices.    Last Colonoscopy: 12/2017 by Dr. Vicente Males Last Endoscopy: 08/2017 by Dr. Vicente Males   Past Medical History:  Past Medical History:  Diagnosis Date  . Diverticulitis   . History of kidney stones   . Hypercholesteremia   . Hypothyroidism   . Peyronie's disease   . Renal cyst, left   . Seasonal allergies   . Thrombocytopenia ([place])     Problem List: Patient Active Problem List   Diagnosis Date Noted  . Acute cholecystitis 01/25/2019  . Splenomegaly 07/07/2017  . Thrombocytopenia ([place]) 06/23/2017  . Cirrhosis ([place]) 06/23/2017    Past Surgical History: Past Surgical History:  Procedure Laterality Date  . COLONOSCOPY  2006  . COLONOSCOPY WITH PROPOFOL N/A 09/08/2017   Procedure: COLONOSCOPY WITH PROPOFOL;  Surgeon: [name], MD;  Location: [facility] ENDOSCOPY;  Service: Gastroenterology;  Laterality: N/A;  . COLONOSCOPY WITH PROPOFOL N/A 01/01/2018   Procedure: COLONOSCOPY WITH PROPOFOL;  Surgeon: [name], MD;  Location: [facility] ENDOSCOPY;  Service: Gastroenterology;  Laterality: N/A;  . CYSTOSCOPY/URETEROSCOPY/HOLMIUM LASER/STENT PLACEMENT Right 01/17/2019   Procedure: CYSTOSCOPY/URETEROSCOPY/STENT PLACEMENT;  Surgeon: Hollice Espy, MD;  Location: [facility] ORS;  Service: Urology;  Laterality: Right;  . ESOPHAGOGASTRODUODENOSCOPY (EGD) WITH PROPOFOL N/A 09/08/2017   Procedure: ESOPHAGOGASTRODUODENOSCOPY (EGD) WITH PROPOFOL;  Surgeon: [name], MD;  Location[facility] ENDOSCOPY;  Service: Gastroenterology;  Laterality: N/A;  . HERNIA REPAIR  1981  . SHOULDER SURGERY Right 2001    Allergies: No Known Allergies  Home Medications: Medications Prior to  Admission  Medication Sig Dispense Refill Last Dose  . docusate sodium (COLACE) 100 MG capsule Take 1 capsule (100 mg  total) by mouth 2 (two) times daily. 60 capsule 0 01/24/2019 at 2000  . HYDROcodone-acetaminophen (NORCO/VICODIN) 5-325 MG tablet Take 1-2 tablets by mouth every 6 (six) hours as needed for moderate pain. 10 tablet 0 01/25/2019 at 0700  . oxybutynin (DITROPAN) 5 MG tablet Take 1 tablet (5 mg total) by mouth every 8 (eight) hours as needed for bladder spasms. 30 tablet 0 01/24/2019 at 2000  . tamsulosin (FLOMAX) 0.4 MG CAPS capsule Take 1 capsule (0.4 mg total) by mouth daily. 30 capsule 0 01/24/2019 at 1800  . Multiple Vitamin (MULTIVITAMIN WITH MINERALS) TABS tablet Take 1 tablet by mouth daily. One-A-Day Multivitamin     . Soft Lens Products (REWETTING DROPS) SOLN Place 1 drop into both eyes daily.      Home medication reconciliation was completed with the patient.   Scheduled Inpatient Medications:   . fentaNYL      . midazolam      . morphine      . pantoprazole (PROTONIX) IV  40 mg Intravenous Q24H    Continuous Inpatient Infusions:   . sodium chloride 100 mL/hr at 01/26/19 0658  . sodium chloride 30 mL (01/26/19 0905)  . piperacillin-tazobactam (ZOSYN)  IV 3.375 g (01/26/19 0907)    PRN Inpatient Medications:  sodium chloride, HYDROcodone-acetaminophen, morphine injection, morphine injection, ondansetron **OR** ondansetron (ZOFRAN) IV  Family History: family history includes Colon cancer in his father; Kidney cancer in his mother.  The patient's family history is negative for inflammatory bowel disorders, GI malignancy, or solid organ transplantation.  Social History:   reports that he has never smoked. He has never used smokeless tobacco. He reports previous alcohol use. He reports that he does not use drugs. The patient denies ETOH, tobacco, or drug use.   Review of Systems: Constitutional: Weight is stable.  Eyes: No changes in vision. ENT: No oral lesions, sore throat.  GI: see HPI.  Heme/Lymph: No easy bruising.  CV: No chest pain.  GU: No hematuria.  Integumentary: No  rashes.  Neuro: No headaches.  Psych: No depression/anxiety.  Endocrine: No heat/cold intolerance.  Allergic/Immunologic: No urticaria.  Resp: No cough, SOB.  Musculoskeletal: No joint swelling.    Physical Examination: BP 100/67 (BP Location: Right Arm)   Pulse 97   Temp (!) 101 F (38.3 C) (Oral)   Resp 16   Ht _0  (1.676 m)   Wt 80.2 kg   SpO2 92%   BMI 28.54 kg/m  Gen: NAD, alert and oriented x 4 HEENT: PEERLA, EOMI, Neck: supple, no JVD or thyromegaly Chest: CTA bilaterally, no wheezes, crackles, or other adventitious sounds CV: RRR, no m/g/c/r Abd: soft, ND, +BS in all four quadrants; tenderness to palpation in RUQ, cholecystostomy tube in place, no HSM, guarding, ridigity, or rebound tenderness Ext: no edema, well perfused with 2+ pulses, Skin: no rash or lesions noted Lymph: no LAD  Data: Lab Results  Component Value Date   WBC 28.4 (H) 01/26/2019   HGB 13.8 01/26/2019   HCT 40.6 01/26/2019   MCV 97.1 01/26/2019   PLT 93 (L) 01/26/2019   Recent Labs  Lab 01/25/19 0913 01/26/19 0509  HGB 15.7 13.8   Lab Results  Component Value Date   NA 137 01/26/2019   K 4.6 01/26/2019   CL 105 01/26/2019   CO2 23 01/26/2019   BUN 18 01/26/2019  CREATININE 1.14 01/26/2019   Lab Results  Component Value Date   ALT 20 01/26/2019   AST 21 01/26/2019   ALKPHOS 66 01/26/2019   BILITOT 3.2 (H) 01/26/2019   Recent Labs  Lab 01/25/19 1633 01/26/19 0509  APTT 33  --   INR 1.4* 1.6*   Assessment/Plan:  58 y/o Caucasian male with a PMH of cirrhosis 2/2 NAFLD, HLD, nephrolithiasis, and hypothyroidism admitted for acute cholecystitis  1. Acute calculous cholecystitis 2. Liver cirrhosis 2/2 NAFLD 3. Portal venous hypertension 4. Thrombocytopenia 5. Right ureteral stone s/p stent placement 09/21  - Pt had cholecystostomy tube placed today by IR - Patient's clinical presentation and labs are consistent with acute cholecystitis. Imaging shows no evidence of  extrahepatic biliary dilatation. LFTs show total bilirubin increased to 3.2, but normal alk phos. Aminotransferases are completely normal.   - Surgery is following. No plan for surgical intervention at this time. - Continue IV Zosyn - Compensated cirrhosis 2/2 NAFLD. MELD 13, Child's Class B (9 pts) - There is a theoretically increased risk of liver decompensation with surgery, but less likely given patient's current compensated cirrhosis.  - There is no indication for ERCP as there is no evidence of extrahepatic biliary ductal dilatation. No clinical concern for ascending cholangitis.  - GI will sign off now. Please call us back if we can help further.  - Patient should continue to follow as an outpatient with Dr. Vicente Males   Thank you for the consult. Please call with questions or concerns.  Reeves Forth [facility] [phone] (Cell)

## 2019-01-26 NOTE — Consult Note (Signed)
Chief Complaint: Acute cholecystitis  Referring Physician(s): Pabon  Patient Status: [facility] - In-pt  History of Present Illness: Ryan Willis. is a 58 y.o. male with past medical history significant for cirrhosis and thrombocytopenia, hyperlipidemia, nephrolithiasis and hypothyroidism who was admitted to the hospital following the acquisition of a CT scan of the abdomen pelvis performed 01/25/2019 obtained for the evaluation of abdominal pain with findings worrisome for acute cholecystitis.  Patient was evaluated by the surgical service and deemed a poor operative candidate and as such request has been made for image guided placement of a cholecystostomy tube for infection source control purposes.  Patient continues to complain of right upper quadrant abdominal pain.  He is otherwise without complaint.  No chest pain or shortness of breath.  No fevers or chills.  Note, the patient also recently underwent right-sided retrograde pyelogram and ureteral stent placement for nephrolithiasis on 01/17/2019.   Past Medical History:  Diagnosis Date   Diverticulitis    History of kidney stones    Hypercholesteremia    Hypothyroidism    Peyronie's disease    Renal cyst, left    Seasonal allergies    Thrombocytopenia ([place])     Past Surgical History:  Procedure Laterality Date   COLONOSCOPY  2006   COLONOSCOPY WITH PROPOFOL N/A 09/08/2017   Procedure: COLONOSCOPY WITH PROPOFOL;  Surgeon: [name], MD;  Location: [facility] ENDOSCOPY;  Service: Gastroenterology;  Laterality: N/A;   COLONOSCOPY WITH PROPOFOL N/A 01/01/2018   Procedure: COLONOSCOPY WITH PROPOFOL;  Surgeon: [name], MD;  Location: [facility] ENDOSCOPY;  Service: Gastroenterology;  Laterality: N/A;   CYSTOSCOPY/URETEROSCOPY/HOLMIUM LASER/STENT PLACEMENT Right 01/17/2019   Procedure: CYSTOSCOPY/URETEROSCOPY/STENT PLACEMENT;  Surgeon: Hollice Espy, MD;  Location: [facility] ORS;  Service: Urology;  Laterality: Right;    ESOPHAGOGASTRODUODENOSCOPY (EGD) WITH PROPOFOL N/A 09/08/2017   Procedure: ESOPHAGOGASTRODUODENOSCOPY (EGD) WITH PROPOFOL;  Surgeon: [name], MD;  Location: [facility] ENDOSCOPY;  Service: Gastroenterology;  Laterality: N/A;   HERNIA REPAIR  1981   SHOULDER SURGERY Right 2001    Allergies: Patient has no known allergies.  Medications: Prior to Admission medications   Medication Sig Start Date End Date Taking? Authorizing Provider  docusate sodium (COLACE) 100 MG capsule Take 1 capsule (100 mg total) by mouth 2 (two) times daily. 01/17/19  Yes Hollice Espy, MD  HYDROcodone-acetaminophen (NORCO/VICODIN) 5-325 MG tablet Take 1-2 tablets by mouth every 6 (six) hours as needed for moderate pain. 01/17/19  Yes Hollice Espy, MD  oxybutynin (DITROPAN) 5 MG tablet Take 1 tablet (5 mg total) by mouth every 8 (eight) hours as needed for bladder spasms. 01/17/19  Yes Hollice Espy, MD  tamsulosin (FLOMAX) 0.4 MG CAPS capsule Take 1 capsule (0.4 mg total) by mouth daily. 01/17/19  Yes Hollice Espy, MD  Multiple Vitamin (MULTIVITAMIN WITH MINERALS) TABS tablet Take 1 tablet by mouth daily. One-A-Day Multivitamin    [provider]  oxyCODONE-acetaminophen (PERCOCET) 5-325 MG tablet Take 1 tablet by mouth every 4 (four) hours as needed. Patient not taking: Reported on 01/13/2019 01/12/19 01/12/20  Gregor Hams, MD  Soft Lens Products (REWETTING DROPS) SOLN Place 1 drop into both eyes daily.    [provider]     Family History  Problem Relation Age of Onset   Kidney cancer Mother    Colon cancer Father     Social History   Socioeconomic History   Marital status: Married    Spouse name: Not on file   Number of children: Not on file  Years of education: Not on file   Highest education level: Not on file  Occupational History   Not on file  Social Needs   Financial resource strain: Not on file   Food insecurity    Worry: Not on file    Inability: Not on  file   Transportation needs    Medical: Not on file    Non-medical: Not on file  Tobacco Use   Smoking status: Never Smoker   Smokeless tobacco: Never Used  Substance and Sexual Activity   Alcohol use: Not Currently    Comment: "1 or 2 year drinks a year."   Drug use: No   Sexual activity: Not on file  Lifestyle   Physical activity    Days per week: Not on file    Minutes per session: Not on file   Stress: Not on file  Relationships   Social connections    Talks on phone: Not on file    Gets together: Not on file    Attends religious service: Not on file    Active member of club or organization: Not on file    Attends meetings of clubs or organizations: Not on file    Relationship status: Not on file  Other Topics Concern   Not on file  Social History Narrative   retd. Engineer, structural; part time information specialist; never smoked; no alcohol; live in [place]. Lives with wife.     ECOG Status: 1 - Symptomatic but completely ambulatory  Review of Systems: A 12 point ROS discussed and pertinent positives are indicated in the HPI above.  All other systems are negative.  Review of Systems  Constitutional: Positive for activity change.  Gastrointestinal: Positive for abdominal pain.    Vital Signs: BP 106/67 (BP Location: Right Arm)    Pulse 81    Temp 98.3 F (36.8 C) (Oral)    Resp 16    Ht 5' 6"  (1.676 m)    Wt 80.2 kg    SpO2 92%    BMI 28.54 kg/m   Physical Exam  Imaging: Dg Chest 2 View  Result Date: 01/25/2019 CLINICAL DATA:  Chest pain EXAM: CHEST - 2 VIEW COMPARISON:  Overlapping portions of CT abdomen from 01/12/2019. FINDINGS: The lungs appear clear. Cardiac and mediastinal margins appear normal. No blunting of the costophrenic angles. Mild thoracic spondylosis. Old deformity from prior fracture left mid clavicle. IMPRESSION: 1. A cause for the patient's chest pain is not identified. Electronically Signed   By: Van Clines M.D.   On: 01/25/2019  09:42   Ct Abdomen Pelvis W Contrast  Result Date: 01/25/2019 CLINICAL DATA:  58 year old presenting with epigastric and RIGHT UPPER QUADRANT abdominal pain which began 2 nights ago and has been constant since approximately 7 o'clock p.m. last night. Current history of urinary tract calculi with a RIGHT double-J ureteral stent in place. EXAM: CT ABDOMEN AND PELVIS WITH CONTRAST TECHNIQUE: Multidetector CT imaging of the abdomen and pelvis was performed using the standard protocol following bolus administration of intravenous contrast. CONTRAST:  149m OMNIPAQUE IOHEXOL 300 MG/ML IV. COMPARISON:  01/12/2019 and earlier. FINDINGS: Lower chest: Small hiatal hernia containing intra-abdominal fat, minimal fluid, and esophageal varices, unchanged since the examination 2 weeks ago. Stable upper normal heart size. Hepatobiliary: Enlargement of the LEFT lobe and caudate lobe relative to the RIGHT lobe as noted previously. No focal hepatic parenchymal abnormality. Adjacent gallstones in the neck of the gallbladder, the largest measuring approximately 7 mm, with mild gallbladder  distention and moderate to severe pericholecystic edema/inflammation. No biliary ductal dilation. Pancreas: Normal in appearance without evidence of mass, ductal dilation, or inflammation. While some of the pericholecystic edema/inflammation is present adjacent to the head of the pancreas, I do not believe that the edema/inflammation is related to pancreatitis. Spleen: Massively enlarged as noted previously. Small hypoenhancing foci in the spleen as noted previously. No new abnormalities. Adrenals/Urinary Tract: Normal appearing adrenal glands. Numerous BILATERAL renal calculi, the largest in a mid to LOWER pole infundibulum on the RIGHT measuring approximately 9 mm and in a LOWER pole calyx of the LEFT kidney measuring approximately 9 mm. RIGHT double-J ureteral stent in place. No visible RIGHT ureteral calculus. No LEFT ureteral calculus. No  hydronephrosis involving either kidney. Scattered benign cortical cysts involving both kidneys as noted previously. Large benign exophytic cyst arising from the LOWER pole of the LEFT kidney measuring approximately 6.1 x 6.7 cm. Normal appearing urinary bladder. Stomach/Bowel: Stomach normal in appearance for the degree of distention. Mild wall thickening involving the descending duodenum, likely secondary inflammation. Small bowel normal in appearance otherwise. Diffuse colonic diverticula, most severe in the sigmoid colon, without evidence of acute diverticulitis. Normal appendix in the RIGHT UPPER pelvis. Vascular/Lymphatic: Mild aortic atherosclerosis without evidence of aneurysm. Normal systemic venous circulation. Patent and enlarged splenic vein and portal vein. Esophageal varices. Recanalized umbilical vein. No pathologic lymphadenopathy. Reproductive: Marked prostate gland enlargement, particularly the median lobe. Calcifications involving the seminal vesicles. Other: Small amount of free fluid in the RIGHT anterior pararenal space and in the RIGHT paracolic gutter. Minimal dependent ascites in the pelvis. Musculoskeletal: Mild diffuse degenerative disc disease and spondylosis throughout the visualized thoracic spine and lumbar spine. No acute findings. IMPRESSION: 1. Cholelithiasis with adjacent stones in the gallbladder neck. Severe acute cholecystitis. No evidence of biliary ductal dilation. 2. Small hiatal hernia containing intra-abdominal fat, minimal fluid, and esophageal varices. 3. Small amount of ascites in the RIGHT anterior pararenal space, the RIGHT paracolic gutter and in the pelvis, related to the acute cholecystitis. 4. Mild wall thickening involving the descending duodenum, likely secondary inflammation. 5. Hepatic cirrhosis with evidence of portal venous hypertension (splenomegaly, esophageal varices, and recanalized umbilical vein). 6. Numerous BILATERAL renal calculi. RIGHT ureteral stent  in place without evidence of hydronephrosis. 7. Diffuse colonic diverticula without evidence of acute diverticulitis. 8. Marked prostate gland enlargement, particularly the median lobe. Aortic Atherosclerosis (ICD10-I70.0). Electronically Signed   By: Evangeline Dakin M.D.   On: 01/25/2019 14:14   Ct Abdomen Pelvis W Contrast  Result Date: 01/12/2019 CLINICAL DATA:  58 year old male with right side abdominal pain. EXAM: CT ABDOMEN AND PELVIS WITH CONTRAST TECHNIQUE: Multidetector CT imaging of the abdomen and pelvis was performed using the standard protocol following bolus administration of intravenous contrast. CONTRAST:  182m OMNIPAQUE IOHEXOL 300 MG/ML  SOLN COMPARISON:  CT Abdomen 07/15/2017. FINDINGS: Lower chest: Stable minor lung base atelectasis or scarring. Small fat containing hiatal hernia is unchanged. Cardiac size at the upper limits of normal. No pericardial or pleural effusion. Hepatobiliary: Nodular and cirrhotic liver. No discrete liver lesion. Small chronic gallstones, up to 6 millimeters. No pericholecystic inflammation. No bile duct enlargement. Pancreas: Negative. Spleen: Splenomegaly is stable to mildly progressed. Previous estimated splenic volume was 1200 milliliters (normal splenic volume range 83 - 412 mL). Mildly heterogeneous splenic enhancement with small hypodense areas is unchanged. Adrenals/Urinary Tract: Negative adrenal glands. Left nephrolithiasis now measures up to 7 millimeters. No left hydronephrosis and negative left ureter. Exophytic 6-7 centimeter left renal  cyst with simple fluid density is stable. There is new right hydronephrosis with perinephric stranding. There is a large 10 millimeter calculus lodged at the right ureteropelvic junction. Additional punctate right intrarenal calculi. Distal to the obstructing stone the right ureter is decompressed. Unremarkable urinary bladder. Stomach/Bowel: Negative rectum. Extensive diverticulosis of the descending and sigmoid  colon but no definite active inflammation. Mild retained stool in the more upstream colon. Elongated but normal appendix (series 2, image 54). Negative terminal ileum. No dilated small bowel. Oral contrast in the stomach and proximal small bowel. No free air. No free fluid. Vascular/Lymphatic: Mild calcified atherosclerosis. Major arterial structures are patent. Portal venous system is patent. There are gastrohepatic ligament and distal paraesophageal varices (series 2, image 16). Small caliber mesenteric varices. No lymphadenopathy. Reproductive: Unusual bulky dystrophic calcifications in the bilateral seminal vesicles (coronal image 64). No regional inflammation. Heterogeneous mild prostate enlargement. Otherwise negative. Other: No pelvic free fluid. Musculoskeletal: No acute osseous abnormality identified. IMPRESSION: 1. Acute obstructive uropathy on the right due to a 10 mm calculus lodged at the right ureteropelvic junction. 2. Bilateral nephrolithiasis, up to 7 mm now on the left. 3. Chronic cirrhosis and portal venous hypertension including splenomegaly, abdominal and paraesophageal varices. 4. Chronic cholelithiasis. 5. Extensive diverticulosis of the descending and sigmoid colon without active inflammation. 6. Small chronic hiatal hernia mostly containing fat. Electronically Signed   By: Genevie Ann M.D.   On: 01/12/2019 20:59   Dg Or Urology Cysto Image ([facility] Only)  Result Date: 01/17/2019 There is no interpretation for this exam.  This order is for images obtained during a surgical procedure.  Please See "Surgeries" Tab for more information regarding the procedure.   US Abdomen Limited Ruq  Result Date: 01/25/2019 CLINICAL DATA:  Onset right lower quadrant pain last night at 7 p.m. EXAM: ULTRASOUND ABDOMEN LIMITED RIGHT UPPER QUADRANT COMPARISON:  CT abdomen and pelvis earlier today. FINDINGS: Gallbladder: The gallbladder is distended and filled with sludge. Gallbladder wall thickness is increased at  0.5 cm and there is some pericholecystic fluid. Stones in the neck of the gallbladder seen on prior CT are not visible on this exam no focal lesion. Common bile duct: Diameter: 0.6 cm Liver: Echogenicity is increased. Subtle nodularity of the liver border is noted. Portal vein is patent on color Doppler imaging with normal direction of blood flow towards the liver. Other: Small volume of perihepatic ascites is seen. IMPRESSION: Small gallstones seen on the CT scan earlier today are not visible on this study. The gallbladder is filled with sludge and there is pericholecystic fluid and gallbladder wall thickening most worrisome for acute cholecystitis. Findings could be secondary to cirrhosis and ascites but this is thought less likely. Cirrhosis and small volume of ascites. Electronically Signed   By: Inge Rise M.D.   On: 01/25/2019 16:02    Labs:  CBC: Recent Labs    02/17/18 0858 01/12/19 1840 01/25/19 0913 01/26/19 0509  WBC 3.9 4.3 16.9* 28.4*  HGB 14.9 15.0 15.7 13.8  HCT 42.8 43.0 44.8 40.6  PLT 92* 75* 124* 93*    COAGS: Recent Labs    02/17/18 0858 01/25/19 1633 01/26/19 0509  INR 1.2 1.4* 1.6*  APTT  --  33  --     BMP: Recent Labs    02/17/18 0858 01/12/19 1840 01/25/19 0913 01/26/19 0509  NA 142 141 136 137  K 4.4 4.3 4.3 4.6  CL 103 107 105 105  CO2 26 25 22 23   GLUCOSE  90 123* 147* 113*  BUN 14 14 17 18   CALCIUM 9.7 9.8 9.2 8.2*  CREATININE 0.92 0.98 0.77 1.14  GFRNONAA 92 >60 >60 >60  GFRAA 106 >60 >60 >60    LIVER FUNCTION TESTS: Recent Labs    02/17/18 0858 01/12/19 1840 01/25/19 1143 01/26/19 0509  BILITOT 1.2 2.2* 2.9* 3.2*  AST 37 38 30 21  ALT 31 32 25 20  ALKPHOS 90 78 73 66  PROT 7.1 7.2 7.1 6.1*  ALBUMIN 4.6 4.4 3.8 3.1*    TUMOR MARKERS: No results for input(s): AFPTM, CEA, CA199, CHROMGRNA in the last 8760 hours.  Assessment and Plan:  Rhonda Vangieson. is a 58 y.o. male with past medical history significant for cirrhosis  and thrombocytopenia, hyperlipidemia, nephrolithiasis and hypothyroidism who was admitted to the hospital following the acquisition of a CT scan of the abdomen pelvis performed 01/25/2019 obtained for the evaluation of abdominal pain with findings worrisome for acute cholecystitis.  Patient was evaluated by the surgical service and deemed a poor operative candidate and as such request has been made for image guided placement of a cholecystostomy tube for infection source control purposes.  The risks and benefits of image guided cholecystostomy tube placement was discussed with the patient including bleeding, infection, damage to adjacent structures, bowel perforation/fistula connection, and sepsis.  All of the patient's questions were answered, patient is agreeable to proceed.  Consent signed and in chart.   Thank you for this interesting consult.  I greatly enjoyed meeting Zavon Hyson. and look forward to participating in their care.  A copy of this report was sent to the requesting provider on this date.  Electronically Signed: Sandi Mariscal, MD 01/26/2019, 1:16 PM   I spent a total of 20 Minutes in face to face in clinical consultation, greater than 50% of which was counseling/coordinating care for cholecystostomy tube placement

## 2019-01-26 NOTE — Progress Notes (Signed)
[facility] [place] NAME: Ryan Willis    MR#:  [id number]  DATE OF BIRTH:  02/03/1961  SUBJECTIVE:  patient came in with chest discomfort epigastric pain and right upper quadrant pain. Found to have cholecystitis. He appears a bit comfortable. He recently had a right ureteral stent placement for renal colic.  No fever. No nausea vomiting.  REVIEW OF SYSTEMS:   Review of Systems  Constitutional: Negative for chills, fever and weight loss.  HENT: Negative for ear discharge, ear pain and nosebleeds.   Eyes: Negative for blurred vision, pain and discharge.  Respiratory: Negative for sputum production, shortness of breath, wheezing and stridor.   Cardiovascular: Negative for chest pain, palpitations, orthopnea and PND.  Gastrointestinal: Positive for abdominal pain. Negative for diarrhea, nausea and vomiting.  Genitourinary: Negative for frequency and urgency.  Musculoskeletal: Negative for back pain and joint pain.  Neurological: Negative for sensory change, speech change, focal weakness and weakness.  Psychiatric/Behavioral: Negative for depression and hallucinations. The patient is not nervous/anxious.    Tolerating Diet: NPO tolerating PT: not needed  DRUG ALLERGIES:  No Known Allergies  VITALS:  Blood pressure 116/67, pulse (!) 104, temperature 98.3 F (36.8 C), temperature source Oral, resp. rate 17, height 5' 6"  (1.676 m), weight 80.2 kg, SpO2 91 %.  PHYSICAL EXAMINATION:   Physical Exam  GENERAL:  58 y.o.-year-old patient lying in the bed with no acute distress.  EYES: Pupils equal, round, reactive to light and accommodation. No scleral icterus. Extraocular muscles intact.  HEENT: Head atraumatic, normocephalic. Oropharynx and nasopharynx clear.  NECK:  Supple, no jugular venous distention. No thyroid enlargement, no tenderness.  LUNGS: Normal breath sounds bilaterally, no wheezing, rales, rhonchi. No use of accessory  muscles of respiration.  CARDIOVASCULAR: S1, S2 normal. No murmurs, rubs, or gallops.  ABDOMEN: Soft, large flake upper quadrant tenderness, nondistended. Bowel sounds present. No organomegaly or mass.  EXTREMITIES: No cyanosis, clubbing or edema b/l.    NEUROLOGIC: Cranial nerves II through XII are intact. No focal Motor or sensory deficits b/l.   PSYCHIATRIC:  patient is alert and oriented x 3.  SKIN: No obvious rash, lesion, or ulcer.   LABORATORY PANEL:  CBC Recent Labs  Lab 01/26/19 0509  WBC 28.4*  HGB 13.8  HCT 40.6  PLT 93*    Chemistries  Recent Labs  Lab 01/26/19 0509  NA 137  K 4.6  CL 105  CO2 23  GLUCOSE 113*  BUN 18  CREATININE 1.14  CALCIUM 8.2*  AST 21  ALT 20  ALKPHOS 66  BILITOT 3.2*   Cardiac Enzymes No results for input(s): TROPONINI in the last 168 hours. RADIOLOGY:  Dg Chest 2 View  Result Date: 01/25/2019 CLINICAL DATA:  Chest pain EXAM: CHEST - 2 VIEW COMPARISON:  Overlapping portions of CT abdomen from 01/12/2019. FINDINGS: The lungs appear clear. Cardiac and mediastinal margins appear normal. No blunting of the costophrenic angles. Mild thoracic spondylosis. Old deformity from prior fracture left mid clavicle. IMPRESSION: 1. A cause for the patient's chest pain is not identified. Electronically Signed   By: Van Clines M.D.   On: 01/25/2019 09:42   [place] Intraoperative  Result Date: 01/26/2019 CLINICAL DATA:  Ultrasound was provided for use by the ordering physician, and a technical charge was applied by the performing facility.  No radiologist interpretation/professional services rendered.   Ct Abdomen Pelvis W Contrast  Result Date: 01/25/2019 CLINICAL DATA:  58 year old presenting with epigastric  and RIGHT UPPER QUADRANT abdominal pain which began 2 nights ago and has been constant since approximately 7 o'clock p.m. last night. Current history of urinary tract calculi with a RIGHT double-J ureteral stent in place. EXAM: CT ABDOMEN AND  PELVIS WITH CONTRAST TECHNIQUE: Multidetector CT imaging of the abdomen and pelvis was performed using the standard protocol following bolus administration of intravenous contrast. CONTRAST:  160m OMNIPAQUE IOHEXOL 300 MG/ML IV. COMPARISON:  01/12/2019 and earlier. FINDINGS: Lower chest: Small hiatal hernia containing intra-abdominal fat, minimal fluid, and esophageal varices, unchanged since the examination 2 weeks ago. Stable upper normal heart size. Hepatobiliary: Enlargement of the LEFT lobe and caudate lobe relative to the RIGHT lobe as noted previously. No focal hepatic parenchymal abnormality. Adjacent gallstones in the neck of the gallbladder, the largest measuring approximately 7 mm, with mild gallbladder distention and moderate to severe pericholecystic edema/inflammation. No biliary ductal dilation. Pancreas: Normal in appearance without evidence of mass, ductal dilation, or inflammation. While some of the pericholecystic edema/inflammation is present adjacent to the head of the pancreas, I do not believe that the edema/inflammation is related to pancreatitis. Spleen: Massively enlarged as noted previously. Small hypoenhancing foci in the spleen as noted previously. No new abnormalities. Adrenals/Urinary Tract: Normal appearing adrenal glands. Numerous BILATERAL renal calculi, the largest in a mid to LOWER pole infundibulum on the RIGHT measuring approximately 9 mm and in a LOWER pole calyx of the LEFT kidney measuring approximately 9 mm. RIGHT double-J ureteral stent in place. No visible RIGHT ureteral calculus. No LEFT ureteral calculus. No hydronephrosis involving either kidney. Scattered benign cortical cysts involving both kidneys as noted previously. Large benign exophytic cyst arising from the LOWER pole of the LEFT kidney measuring approximately 6.1 x 6.7 cm. Normal appearing urinary bladder. Stomach/Bowel: Stomach normal in appearance for the degree of distention. Mild wall thickening involving  the descending duodenum, likely secondary inflammation. Small bowel normal in appearance otherwise. Diffuse colonic diverticula, most severe in the sigmoid colon, without evidence of acute diverticulitis. Normal appendix in the RIGHT UPPER pelvis. Vascular/Lymphatic: Mild aortic atherosclerosis without evidence of aneurysm. Normal systemic venous circulation. Patent and enlarged splenic vein and portal vein. Esophageal varices. Recanalized umbilical vein. No pathologic lymphadenopathy. Reproductive: Marked prostate gland enlargement, particularly the median lobe. Calcifications involving the seminal vesicles. Other: Small amount of free fluid in the RIGHT anterior pararenal space and in the RIGHT paracolic gutter. Minimal dependent ascites in the pelvis. Musculoskeletal: Mild diffuse degenerative disc disease and spondylosis throughout the visualized thoracic spine and lumbar spine. No acute findings. IMPRESSION: 1. Cholelithiasis with adjacent stones in the gallbladder neck. Severe acute cholecystitis. No evidence of biliary ductal dilation. 2. Small hiatal hernia containing intra-abdominal fat, minimal fluid, and esophageal varices. 3. Small amount of ascites in the RIGHT anterior pararenal space, the RIGHT paracolic gutter and in the pelvis, related to the acute cholecystitis. 4. Mild wall thickening involving the descending duodenum, likely secondary inflammation. 5. Hepatic cirrhosis with evidence of portal venous hypertension (splenomegaly, esophageal varices, and recanalized umbilical vein). 6. Numerous BILATERAL renal calculi. RIGHT ureteral stent in place without evidence of hydronephrosis. 7. Diffuse colonic diverticula without evidence of acute diverticulitis. 8. Marked prostate gland enlargement, particularly the median lobe. Aortic Atherosclerosis (ICD10-I70.0). Electronically Signed   By: TEvangeline DakinM.D.   On: 01/25/2019 14:14   [place]dom[place] Limited Ruq  Result Date: 01/25/2019 CLINICAL DATA:   Onset right lower quadrant pain last night at 7 p.m. EXAM: ULTRASOUND ABDOMEN LIMITED RIGHT UPPER QUADRANT COMPARISON:  CT abdomen and pelvis earlier today. FINDINGS: Gallbladder: The gallbladder is distended and filled with sludge. Gallbladder wall thickness is increased at 0.5 cm and there is some pericholecystic fluid. Stones in the neck of the gallbladder seen on prior CT are not visible on this exam no focal lesion. Common bile duct: Diameter: 0.6 cm Liver: Echogenicity is increased. Subtle nodularity of the liver border is noted. Portal vein is patent on color Doppler imaging with normal direction of blood flow towards the liver. Other: Small volume of perihepatic ascites is seen. IMPRESSION: Small gallstones seen on the CT scan earlier today are not visible on this study. The gallbladder is filled with sludge and there is pericholecystic fluid and gallbladder wall thickening most worrisome for acute cholecystitis. Findings could be secondary to cirrhosis and ascites but this is thought less likely. Cirrhosis and small volume of ascites. Electronically Signed   By: Inge Rise M.D.   On: 01/25/2019 16:02   ASSESSMENT AND PLAN:  Ryan Willis  is a 58 y.o. male with a known history of kidney stones, recently seen by Dr. Erlene Quan on 01/17/19 for right ureteropelvic junction stone had cystoscopy and stent placement, hyperlipidemia, hypothyroidism, hyperlipidemia and other medical problems is presenting to the ED with a chief complaint of right upper quadrant pain associated with nausea.  1.Acute right-sided abdominal pain from acute cholecystitis -N.p.o., IV fluids and IV Zosyn as recommended by surgery -Patient was seen and evaluated by surgery Dr. Dahlia Byes who is not considering surgery at this time given the history of liver cirrhosis and thrombocytopenia -Interventional radiology to place for cholecystostomy tube placement. -Pain management as needed  #Liver cirrhosis/NASH -Monitor LFTs and platelet  count -stable. - Pt follows with Dr Vicente Males as out pt  #Hypothyroidism Patient is not on any Synthyroid we will check his TSH  #Hyperlipidemia  -hold cholesterol medicine Currently patient is n.p.o.  #history of right ureteral stone status post stent placement on nine 2120 by Dr. Erlene Quan  #DVT prophylaxis with SCDs due to chronic TCP  Case discussed with Care Management/Social Worker. Management plans discussed with the patient and they are in agreement.  CODE STATUS: full  TOTAL TIME TAKING CARE OF THIS PATIENT: *30 minutes.  >50% time spent on counselling and coordination of care  POSSIBLE D/C IN *1-2* DAYS, DEPENDING ON CLINICAL CONDITION.  Note: This dictation was prepared with Dragon dictation along with smaller phrase technology. Any transcriptional errors that result from this process are unintentional.  Fritzi Mandes M.D on 01/26/2019 at 2:31 PM  Between 7am to 6pm - Pager - [phone]  After 6pm go to [email or web address] - password [email or web address]  [facility]  Office  [phone]  CC: Primary care physician; Patient, No Pcp PerPatient ID: Ryan Willis., male   DOB: [birth year], 58 y.o.   MRN: [id number]

## 2019-01-26 NOTE — Procedures (Signed)
Pre procedural Dx: Acute cholecysitis Post procedural Dx: Same  Technically successful [place] and CT guided placement of a 10 Fr drainage catheter placement into the gallbladder lumen. Chole tube connected to gravity bag.  EBL: None  Complications: None immediate  Ronny Bacon, MD Pager #: [phone]

## 2019-01-27 ENCOUNTER — Other Ambulatory Visit: Admission: RE | Admit: 2019-01-27 | Payer: 59 | Source: Ambulatory Visit

## 2019-01-27 LAB — COMPREHENSIVE METABOLIC PANEL
ALT: 18 U/L (ref 0–44)
AST: 16 U/L (ref 15–41)
Albumin: 2.7 g/dL — ABNORMAL LOW (ref 3.5–5.0)
Alkaline Phosphatase: 61 U/L (ref 38–126)
Anion gap: 7 (ref 5–15)
BUN: 18 mg/dL (ref 6–20)
CO2: 22 mmol/L (ref 22–32)
Calcium: 8.2 mg/dL — ABNORMAL LOW (ref 8.9–10.3)
Chloride: 107 mmol/L (ref 98–111)
Creatinine, Ser: 0.78 mg/dL (ref 0.61–1.24)
GFR calc Af Amer: >60 mL/min (ref >60)
GFR calc non Af Amer: >60 mL/min (ref >60)
Glucose, Bld: 103 mg/dL — ABNORMAL HIGH (ref 70–99)
Potassium: 4.1 mmol/L (ref 3.5–5.1)
Sodium: 136 mmol/L (ref 135–145)
Total Bilirubin: 2.7 mg/dL — ABNORMAL HIGH (ref 0.3–1.2)
Total Protein: 5.7 g/dL — ABNORMAL LOW (ref 6.5–8.1)

## 2019-01-27 LAB — CBC
HCT: 38.2 % — ABNORMAL LOW (ref 39.0–52.0)
Hemoglobin: 13.1 g/dL (ref 13.0–17.0)
MCH: 33.7 pg (ref 26.0–34.0)
MCHC: 34.3 g/dL (ref 30.0–36.0)
MCV: 98.2 fL (ref 80.0–100.0)
Platelets: 91 10*3/uL — ABNORMAL LOW (ref 150–400)
RBC: 3.89 MIL/uL — ABNORMAL LOW (ref 4.22–5.81)
RDW: 13 % (ref 11.5–15.5)
WBC: 20.1 10*3/uL — ABNORMAL HIGH (ref 4.0–10.5)
nRBC: 0 % (ref 0.0–0.2)

## 2019-01-27 MED ORDER — DOCUSATE SODIUM 100 MG PO CAPS
100.0000 mg | ORAL_CAPSULE | Freq: Two times a day (BID) | ORAL | Status: DC
  Administered 2019-01-27 – 2019-01-28 (×3): 100 mg via ORAL
  Filled 2019-01-27 (×3): qty 1

## 2019-01-27 MED ORDER — TAMSULOSIN HCL 0.4 MG PO CAPS
0.4000 mg | ORAL_CAPSULE | Freq: Every day | ORAL | Status: DC
  Administered 2019-01-27: 0.4 mg via ORAL
  Filled 2019-01-27: qty 1

## 2019-01-27 MED ORDER — OXYBUTYNIN CHLORIDE 5 MG PO TABS: 5.0000 mg | ORAL_TABLET | Freq: Three times a day (TID) | ORAL | Status: DC | PRN

## 2019-01-27 NOTE — Progress Notes (Signed)
[facility] [place] NAME: Ryan Willis    MR#:  [id number]  DATE OF BIRTH:  Jun 13, 1960  SUBJECTIVE:  patient came in with chest discomfort/ epigastric pain and right upper quadrant pain. Found to have cholecystitis.  No fever. No nausea vomiting. Feels a lot better  REVIEW OF SYSTEMS:   Review of Systems  Constitutional: Negative for chills, fever and weight loss.  HENT: Negative for ear discharge, ear pain and nosebleeds.   Eyes: Negative for blurred vision, pain and discharge.  Respiratory: Negative for sputum production, shortness of breath, wheezing and stridor.   Cardiovascular: Negative for chest pain, palpitations, orthopnea and PND.  Gastrointestinal: Positive for abdominal pain. Negative for diarrhea, nausea and vomiting.  Genitourinary: Negative for frequency and urgency.  Musculoskeletal: Negative for back pain and joint pain.  Neurological: Negative for sensory change, speech change, focal weakness and weakness.  Psychiatric/Behavioral: Negative for depression and hallucinations. The patient is not nervous/anxious.    Tolerating Diet:CLEAR tolerating PT: not needed  DRUG ALLERGIES:  No Known Allergies  VITALS:  Blood pressure 97/67, pulse 85, temperature 98.7 F (37.1 C), temperature source Oral, resp. rate 20, height 5' 6"  (1.676 m), weight 80.2 kg, SpO2 92 %.  PHYSICAL EXAMINATION:   Physical Exam  GENERAL:  58 y.o.-year-old patient lying in the bed with no acute distress.  EYES: Pupils equal, round, reactive to light and accommodation. No scleral icterus. Extraocular muscles intact.  HEENT: Head atraumatic, normocephalic. Oropharynx and nasopharynx clear.  NECK:  Supple, no jugular venous distention. No thyroid enlargement, no tenderness.  LUNGS: Normal breath sounds bilaterally, no wheezing, rales, rhonchi. No use of accessory muscles of respiration.  CARDIOVASCULAR: S1, S2 normal. No murmurs, rubs, or  gallops.  ABDOMEN: Soft, large flake upper quadrant tenderness, nondistended. Bowel sounds present. Right Cholecystomy drain + EXTREMITIES: No cyanosis, clubbing or edema b/l.    NEUROLOGIC: Cranial nerves II through XII are intact. No focal Motor or sensory deficits b/l.   PSYCHIATRIC:  patient is alert and oriented x 3.  SKIN: No obvious rash, lesion, or ulcer.   LABORATORY PANEL:  CBC Recent Labs  Lab 01/27/19 0750  WBC 20.1*  HGB 13.1  HCT 38.2*  PLT 91*    Chemistries  Recent Labs  Lab 01/27/19 0750  NA 136  K 4.1  CL 107  CO2 22  GLUCOSE 103*  BUN 18  CREATININE 0.78  CALCIUM 8.2*  AST 16  ALT 18  ALKPHOS 61  BILITOT 2.7*   Cardiac Enzymes No results for input(s): TROPONINI in the last 168 hours. RADIOLOGY:  [place] Intraoperative  Result Date: 01/26/2019 INDICATION: Acute cholecystitis. Poor operative candidate. Request made for image placement of a cholecystostomy tube for infection source control purposes EXAM: ULTRASOUND AND CT-GUIDED CHOLECYSTOSTOMY TUBE PLACEMENT COMPARISON:  CT abdomen and pelvis - 01/24/2018 MEDICATIONS: The patient is currently admitted to the hospital and on intravenous antibiotics. Antibiotics were administered within an appropriate time frame prior to skin puncture. ANESTHESIA/SEDATION: Moderate (conscious) sedation was employed during this procedure. A total of Versed 2 mg and Fentanyl 100 mcg was administered intravenously. Moderate Sedation Time: 18 minutes. The patient's level of consciousness and vital signs were monitored continuously by radiology nursing throughout the procedure under my direct supervision. CONTRAST:  None FLUOROSCOPY TIME:  N/A COMPLICATIONS: None immediate. PROCEDURE: Informed written consent was obtained from the patient after a discussion of the risks, benefits and alternatives to treatment. Questions regarding the procedure were encouraged  and answered. A timeout was performed prior to the initiation of the procedure.  Patient was positioned supine, slightly LPO on the CT gantry and noncontrast imaging was obtained of the right upper abdominal quadrant. The gallbladder was then identified sonographically. The right upper abdominal quadrant was prepped and draped in the usual sterile fashion, and a sterile drape was applied covering the operative field. Maximum barrier sterile technique with sterile gowns and gloves were used for the procedure. A timeout was performed prior to the initiation of the procedure. Local anesthesia was provided with 1% lidocaine with epinephrine. Ultrasound scanning of the right upper quadrant demonstrates a dilated gallbladder. Utilizing a transhepatic approach, an 18 gauge trocar needle was advanced into the gallbladder under direct ultrasound guidance and a short Amplatz wire was coiled within the gallbladder lumen. An ultrasound image was saved for documentation purposes. Appropriate position was confirmed with CT imaging. Next, the track was dilated ultimately allowing placement of a 10.2-French Cook cholecystomy tube. Limited postprocedural CT imaging demonstrates appropriate positioning of the cholecystostomy tube with end coiled and locked within the gallbladder lumen. The catheter was secured to the skin with suture, connected to a drainage bag and a dressing was placed. The patient tolerated the procedure well without immediate post procedural complication. IMPRESSION: Successful ultrasound and CT guided placement of a 10.2 French cholecystostomy tube. Electronically Signed   By: Sandi Mariscal M.D.   On: 01/26/2019 14:34   Ct Abdomen Pelvis W Contrast  Result Date: 01/25/2019 CLINICAL DATA:  57 year old presenting with epigastric and RIGHT UPPER QUADRANT abdominal pain which began 2 nights ago and has been constant since approximately 7 o'clock p.m. last night. Current history of urinary tract calculi with a RIGHT double-J ureteral stent in place. EXAM: CT ABDOMEN AND PELVIS WITH CONTRAST  TECHNIQUE: Multidetector CT imaging of the abdomen and pelvis was performed using the standard protocol following bolus administration of intravenous contrast. CONTRAST:  168m OMNIPAQUE IOHEXOL 300 MG/ML IV. COMPARISON:  01/12/2019 and earlier. FINDINGS: Lower chest: Small hiatal hernia containing intra-abdominal fat, minimal fluid, and esophageal varices, unchanged since the examination 2 weeks ago. Stable upper normal heart size. Hepatobiliary: Enlargement of the LEFT lobe and caudate lobe relative to the RIGHT lobe as noted previously. No focal hepatic parenchymal abnormality. Adjacent gallstones in the neck of the gallbladder, the largest measuring approximately 7 mm, with mild gallbladder distention and moderate to severe pericholecystic edema/inflammation. No biliary ductal dilation. Pancreas: Normal in appearance without evidence of mass, ductal dilation, or inflammation. While some of the pericholecystic edema/inflammation is present adjacent to the head of the pancreas, I do not believe that the edema/inflammation is related to pancreatitis. Spleen: Massively enlarged as noted previously. Small hypoenhancing foci in the spleen as noted previously. No new abnormalities. Adrenals/Urinary Tract: Normal appearing adrenal glands. Numerous BILATERAL renal calculi, the largest in a mid to LOWER pole infundibulum on the RIGHT measuring approximately 9 mm and in a LOWER pole calyx of the LEFT kidney measuring approximately 9 mm. RIGHT double-J ureteral stent in place. No visible RIGHT ureteral calculus. No LEFT ureteral calculus. No hydronephrosis involving either kidney. Scattered benign cortical cysts involving both kidneys as noted previously. Large benign exophytic cyst arising from the LOWER pole of the LEFT kidney measuring approximately 6.1 x 6.7 cm. Normal appearing urinary bladder. Stomach/Bowel: Stomach normal in appearance for the degree of distention. Mild wall thickening involving the descending  duodenum, likely secondary inflammation. Small bowel normal in appearance otherwise. Diffuse colonic diverticula, most severe in the  sigmoid colon, without evidence of acute diverticulitis. Normal appendix in the RIGHT UPPER pelvis. Vascular/Lymphatic: Mild aortic atherosclerosis without evidence of aneurysm. Normal systemic venous circulation. Patent and enlarged splenic vein and portal vein. Esophageal varices. Recanalized umbilical vein. No pathologic lymphadenopathy. Reproductive: Marked prostate gland enlargement, particularly the median lobe. Calcifications involving the seminal vesicles. Other: Small amount of free fluid in the RIGHT anterior pararenal space and in the RIGHT paracolic gutter. Minimal dependent ascites in the pelvis. Musculoskeletal: Mild diffuse degenerative disc disease and spondylosis throughout the visualized thoracic spine and lumbar spine. No acute findings. IMPRESSION: 1. Cholelithiasis with adjacent stones in the gallbladder neck. Severe acute cholecystitis. No evidence of biliary ductal dilation. 2. Small hiatal hernia containing intra-abdominal fat, minimal fluid, and esophageal varices. 3. Small amount of ascites in the RIGHT anterior pararenal space, the RIGHT paracolic gutter and in the pelvis, related to the acute cholecystitis. 4. Mild wall thickening involving the descending duodenum, likely secondary inflammation. 5. Hepatic cirrhosis with evidence of portal venous hypertension (splenomegaly, esophageal varices, and recanalized umbilical vein). 6. Numerous BILATERAL renal calculi. RIGHT ureteral stent in place without evidence of hydronephrosis. 7. Diffuse colonic diverticula without evidence of acute diverticulitis. 8. Marked prostate gland enlargement, particularly the median lobe. Aortic Atherosclerosis (ICD10-I70.0). Electronically Signed   By: Evangeline Dakin M.D.   On: 01/25/2019 14:14   Ct Image Guided Fluid Drain By Catheter  Result Date: 01/26/2019 INDICATION:  Acute cholecystitis. Poor operative candidate. Request made for image placement of a cholecystostomy tube for infection source control purposes EXAM: ULTRASOUND AND CT-GUIDED CHOLECYSTOSTOMY TUBE PLACEMENT COMPARISON:  CT abdomen and pelvis - 01/24/2018 MEDICATIONS: The patient is currently admitted to the hospital and on intravenous antibiotics. Antibiotics were administered within an appropriate time frame prior to skin puncture. ANESTHESIA/SEDATION: Moderate (conscious) sedation was employed during this procedure. A total of Versed 2 mg and Fentanyl 100 mcg was administered intravenously. Moderate Sedation Time: 18 minutes. The patient's level of consciousness and vital signs were monitored continuously by radiology nursing throughout the procedure under my direct supervision. CONTRAST:  None FLUOROSCOPY TIME:  N/A COMPLICATIONS: None immediate. PROCEDURE: Informed written consent was obtained from the patient after a discussion of the risks, benefits and alternatives to treatment. Questions regarding the procedure were encouraged and answered. A timeout was performed prior to the initiation of the procedure. Patient was positioned supine, slightly LPO on the CT gantry and noncontrast imaging was obtained of the right upper abdominal quadrant. The gallbladder was then identified sonographically. The right upper abdominal quadrant was prepped and draped in the usual sterile fashion, and a sterile drape was applied covering the operative field. Maximum barrier sterile technique with sterile gowns and gloves were used for the procedure. A timeout was performed prior to the initiation of the procedure. Local anesthesia was provided with 1% lidocaine with epinephrine. Ultrasound scanning of the right upper quadrant demonstrates a dilated gallbladder. Utilizing a transhepatic approach, an 18 gauge trocar needle was advanced into the gallbladder under direct ultrasound guidance and a short Amplatz wire was coiled within  the gallbladder lumen. An ultrasound image was saved for documentation purposes. Appropriate position was confirmed with CT imaging. Next, the track was dilated ultimately allowing placement of a 10.2-French Cook cholecystomy tube. Limited postprocedural CT imaging demonstrates appropriate positioning of the cholecystostomy tube with end coiled and locked within the gallbladder lumen. The catheter was secured to the skin with suture, connected to a drainage bag and a dressing was placed. The patient tolerated the procedure  well without immediate post procedural complication. IMPRESSION: Successful ultrasound and CT guided placement of a 10.2 French cholecystostomy tube. Electronically Signed   By: Sandi Mariscal M.D.   On: 01/26/2019 14:34   US Abdomen Limited Ruq  Result Date: 01/25/2019 CLINICAL DATA:  Onset right lower quadrant pain last night at 7 p.m. EXAM: ULTRASOUND ABDOMEN LIMITED RIGHT UPPER QUADRANT COMPARISON:  CT abdomen and pelvis earlier today. FINDINGS: Gallbladder: The gallbladder is distended and filled with sludge. Gallbladder wall thickness is increased at 0.5 cm and there is some pericholecystic fluid. Stones in the neck of the gallbladder seen on prior CT are not visible on this exam no focal lesion. Common bile duct: Diameter: 0.6 cm Liver: Echogenicity is increased. Subtle nodularity of the liver border is noted. Portal vein is patent on color Doppler imaging with normal direction of blood flow towards the liver. Other: Small volume of perihepatic ascites is seen. IMPRESSION: Small gallstones seen on the CT scan earlier today are not visible on this study. The gallbladder is filled with sludge and there is pericholecystic fluid and gallbladder wall thickening most worrisome for acute cholecystitis. Findings could be secondary to cirrhosis and ascites but this is thought less likely. Cirrhosis and small volume of ascites. Electronically Signed   By: Inge Rise M.D.   On: 01/25/2019 16:02    ASSESSMENT AND PLAN:  Ryan Willis  is a 58 y.o. male with a known history of kidney stones, recently seen by Dr. Erlene Quan on 01/17/19 for right ureteropelvic junction stone had cystoscopy and stent placement, hyperlipidemia, hypothyroidism, hyperlipidemia and other medical problems is presenting to the ED with a chief complaint of right upper quadrant pain associated with nausea.  1.Acute right-sided abdominal pain from acute cholecystitis -N.p.o., IV fluids and IV Zosyn as recommended by surgery -Patient was seen and evaluated by surgery Dr. Dahlia Byes who is not considering surgery at this time given the history of liver cirrhosis and thrombocytopenia -s/p cholecystostomy tube placement by IR on sept 30th. -Pain management as needed -patient feels a lot better. -Labs improving including white count 28K--20K  #Liver cirrhosis/NASH -stable.  - Pt follows with Dr Vicente Males as out pt  #Hypothyroidism Patient is not on any Synthyroid we will check his TSH  #Hyperlipidemia  -resume statins at discharge#history of right ureteral stone status post stent placement on nine 2120 by Dr. Erlene Quan  #DVT prophylaxis with SCDs due to chronic TCP  Case discussed with Care Management/Social Worker. Management plans discussed with the patient and they are in agreement.  CODE STATUS: full  TOTAL TIME TAKING CARE OF THIS PATIENT: *30 minutes.  >50% time spent on counselling and coordination of care  POSSIBLE D/C IN *1-2* DAYS, DEPENDING ON CLINICAL CONDITION.  Note: This dictation was prepared with Dragon dictation along with smaller phrase technology. Any transcriptional errors that result from this process are unintentional.  Ryan Willis M.D on 01/27/2019 at 10:50 AM  Between 7am to 6pm - Pager - [phone]  After 6pm go to [email or web address] - password [email or web address]  [facility]  Office  [phone]  CC: Primary care physician; Patient, No Pcp PerPatient ID: Ryan Willis., male   DOB:  Jan 05, 1961, 58 y.o.   MRN: [id number]

## 2019-01-27 NOTE — Progress Notes (Signed)
[facility] SURGICAL PROGRESS NOTE (cpt [phone])  Hospital Day(s): 2.   Interval History: Patient seen and examined, no acute events or new complaints overnight. Patient reports that he is feeling much better after placement of cholecystostomy tube. His abdominal pain has improved. No fever, chills, nausea, or emesis. Cholecystostomy output has been 370 since placement. Leukocytosis improved to 20K from 28K yesterday. Bilirubin also down to 2.7 from 3.2. Tolerated CLD. No other issues  Review of Systems:  Constitutional: denies fever, chills  HEENT: denies cough or congestion  Respiratory: denies any shortness of breath  Cardiovascular: denies chest pain or palpitations  Gastrointestinal: + abdominal pain (Improved), denied N/V, or diarrhea/and bowel function as per interval history Genitourinary: denies burning with urination or urinary frequency   Vital signs in last 24 hours: [min-max] current  Temp:  [98.7 F (37.1 C)-101 F (38.3 C)] 98.7 F (37.1 C) (09/30 2111) Pulse Rate:  [85-105] 85 (10/01 0525) Resp:  [10-25] 20 (10/01 0525) BP: (92-116)/(55-74) 97/67 (10/01 0525) SpO2:  [90 %-93 %] 92 % (10/01 0525)     Height: 5\' 6"  (167.6 cm) Weight: 80.2 kg BMI (Calculated): 28.55   Intake/Output last 2 shifts:  09/30 0701 - 10/01 0700 In: 1442.1 [P.O.:240; I.V.:1087.7; IV Piggyback:114.4] Out: 1050 [Urine:680; Drains:370]   Physical Exam:  Constitutional: alert, cooperative and no distress  HENT: normocephalic without obvious abnormality  Eyes: PERRL, EOM's grossly intact and symmetric  Respiratory: breathing non-labored at rest  Cardiovascular: regular rate and sinus rhythm  Gastrointestinal: soft, non-tender, and non-distended. Cholecystostomy tube in RUQ with bilious output Musculoskeletal: no edema or wounds, motor and sensation grossly intact, NT    Labs:  CBC Latest Ref Rng & Units 01/27/2019 01/26/2019 01/25/2019  WBC 4.0 - 10.5 K/uL 20.1(H) 28.4(H)  16.9(H)  Hemoglobin 13.0 - 17.0 g/dL 13.1 13.8 15.7  Hematocrit 39.0 - 52.0 % 38.2(L) 40.6 44.8  Platelets 150 - 400 K/uL 91(L) 93(L) 124(L)   CMP Latest Ref Rng & Units 01/27/2019 01/26/2019 01/25/2019  Glucose 70 - 99 mg/dL 103(H) 113(H) 147(H)  BUN 6 - 20 mg/dL 18 18 17   Creatinine 0.61 - 1.24 mg/dL 0.78 1.14 0.77  Sodium 135 - 145 mmol/L 136 137 136  Potassium 3.5 - 5.1 mmol/L 4.1 4.6 4.3  Chloride 98 - 111 mmol/L 107 105 105  CO2 22 - 32 mmol/L 22 23 22   Calcium 8.9 - 10.3 mg/dL 8.2(L) 8.2(L) 9.2  Total Protein 6.5 - 8.1 g/dL 5.7(L) 6.1(L) 7.1  Total Bilirubin 0.3 - 1.2 mg/dL 2.7(H) 3.2(H) 2.9(H)  Alkaline Phos 38 - 126 U/L 61 66 73  AST 15 - 41 U/L 16 21 30   ALT 0 - 44 U/L 18 20 25      Imaging studies: No new pertinent imaging studies   Assessment/Plan: (ICD-10's: K81.0) 58 y.o. male with improved abdominal pain, leukocytosis, and hyperbilirubinemia attributable to acute cholecysitits s/p percutaneous cholecystostomy tube placement on [id number], complicated by pertinent comorbidities including NAFLD cirrhosis and thrombocytopenia.   - Advance diet as tolerates this morning   - pain control prn   - monitor abdominal examination  - monitor cholecystostomy tube output  - Trend leukocytosis, liver function, bilirubin  - No indication for emergent intervention surgically. He understands we will follow him outpatient for medical optimization and interval cholecystectomy   - mobilization encouraged   - Appreciate GI input  - further management per primary team   All of the above findings and recommendations were discussed with the patient, and the medical  team, and all of patient's questions were answered to his expressed satisfaction.=  -- Edison Simon, PA-C [facility] 01/27/2019, 10:39 AM [phone] M-F: 7am - 4pm

## 2019-01-28 LAB — COMPREHENSIVE METABOLIC PANEL
ALT: 14 U/L (ref 0–44)
AST: 12 U/L — ABNORMAL LOW (ref 15–41)
Albumin: 2.6 g/dL — ABNORMAL LOW (ref 3.5–5.0)
Alkaline Phosphatase: 67 U/L (ref 38–126)
Anion gap: 7 (ref 5–15)
BUN: 14 mg/dL (ref 6–20)
CO2: 24 mmol/L (ref 22–32)
Calcium: 8.2 mg/dL — ABNORMAL LOW (ref 8.9–10.3)
Chloride: 108 mmol/L (ref 98–111)
Creatinine, Ser: 0.76 mg/dL (ref 0.61–1.24)
GFR calc Af Amer: >60 mL/min (ref >60)
GFR calc non Af Amer: >60 mL/min (ref >60)
Glucose, Bld: 97 mg/dL (ref 70–99)
Potassium: 3.7 mmol/L (ref 3.5–5.1)
Sodium: 139 mmol/L (ref 135–145)
Total Bilirubin: 2.3 mg/dL — ABNORMAL HIGH (ref 0.3–1.2)
Total Protein: 5.3 g/dL — ABNORMAL LOW (ref 6.5–8.1)

## 2019-01-28 LAB — CBC
HCT: 34.7 % — ABNORMAL LOW (ref 39.0–52.0)
Hemoglobin: 11.9 g/dL — ABNORMAL LOW (ref 13.0–17.0)
MCH: 33.4 pg (ref 26.0–34.0)
MCHC: 34.3 g/dL (ref 30.0–36.0)
MCV: 97.5 fL (ref 80.0–100.0)
Platelets: 94 10*3/uL — ABNORMAL LOW (ref 150–400)
RBC: 3.56 MIL/uL — ABNORMAL LOW (ref 4.22–5.81)
RDW: 12.2 % (ref 11.5–15.5)
WBC: 10.2 10*3/uL (ref 4.0–10.5)
nRBC: 0 % (ref 0.0–0.2)

## 2019-01-28 MED ORDER — AMOXICILLIN-POT CLAVULANATE 875-125 MG PO TABS: 1.0000 | ORAL_TABLET | Freq: Two times a day (BID) | ORAL | Status: DC

## 2019-01-28 MED ORDER — OXYCODONE HCL 5 MG PO TABS: 5.0000 mg | ORAL_TABLET | Freq: Four times a day (QID) | ORAL | 0 refills | Status: DC | PRN

## 2019-01-28 MED ORDER — AMOXICILLIN-POT CLAVULANATE 875-125 MG PO TABS: 1.0000 | ORAL_TABLET | Freq: Two times a day (BID) | ORAL | 0 refills | Status: DC

## 2019-01-28 NOTE — Progress Notes (Signed)
Ryan Willis. to be D/C'd Patient's home per MD order.  Discussed prescriptions and follow up appointments with the patient. Prescriptions given to patient, medication list explained in detail. Pt verbalized understanding.  Allergies as of 01/28/2019   No Known Allergies      Medication List     STOP taking these medications    HYDROcodone-acetaminophen 5-325 MG tablet Commonly known as: NORCO/VICODIN       TAKE these medications    amoxicillin-clavulanate 875-125 MG tablet Commonly known as: AUGMENTIN Take 1 tablet by mouth every 12 (twelve) hours.   docusate sodium 100 MG capsule Commonly known as: COLACE Take 1 capsule (100 mg total) by mouth 2 (two) times daily.   multivitamin with minerals Tabs tablet Take 1 tablet by mouth daily. One-A-Day Multivitamin   oxybutynin 5 MG tablet Commonly known as: DITROPAN Take 1 tablet (5 mg total) by mouth every 8 (eight) hours as needed for bladder spasms.   oxyCODONE 5 MG immediate release tablet Commonly known as: Oxy IR/ROXICODONE Take 1 tablet (5 mg total) by mouth every 6 (six) hours as needed for severe pain or breakthrough pain.   Rewetting Drops Soln Place 1 drop into both eyes daily.   tamsulosin 0.4 MG Caps capsule Commonly known as: Flomax Take 1 capsule (0.4 mg total) by mouth daily.        Vitals:   01/28/19 0413 01/28/19 1316  BP: 93/64 106/73  Pulse: 69 89  Resp: 20 20  Temp: 98.4 F (36.9 C) 99.1 F (37.3 C)  SpO2: 91% 91%    Skin clean, dry and intact without evidence of skin break down, no evidence of skin tears noted. IV catheter discontinued intact. Site without signs and symptoms of complications. Dressing and pressure applied. Pt denies pain at this time. No complaints noted.  An After Visit Summary was printed and given to the patient. Patient escorted via [place], and D/C home via private auto.  [place]

## 2019-01-28 NOTE — Progress Notes (Signed)
[facility] SURGICAL PROGRESS NOTE (cpt [phone])  Hospital Day(s): 3.   Interval History: Patient seen and examined, no acute events or new complaints overnight. Patient reports he continues to feel improved. Febrile to 100.9 overnight, bu this has resolved. Leukocytosis resolved. No chills, nausea, or emesis. He has tolerated a diet without issues. Hyperbilirubinemia - 2.3 (?baseline) Cholecystostomy - 300 ccs out in last 24 hours and bilious  Review of Systems:  Constitutional: denies fever, chills  HEENT: denies cough or congestion  Respiratory: denies any shortness of breath  Cardiovascular: denies chest pain or palpitations  Gastrointestinal: denies abdominal pain, N/V, or diarrhea/and bowel function as per interval history Genitourinary: denies burning with urination or urinary frequency Musculoskeletal: denies pain, decreased motor or sensation   Vital signs in last 24 hours: [min-max] current  Temp:  [98.4 F (36.9 C)-100.9 F (38.3 C)] 98.4 F (36.9 C) (10/02 0413) Pulse Rate:  [69-99] 69 (10/02 0413) Resp:  [18-20] 20 (10/02 0413) BP: (93-108)/(64-71) 93/64 (10/02 0413) SpO2:  [90 %-91 %] 91 % (10/02 0413)     Height: 5' 6"  (167.6 cm) Weight: 80.2 kg BMI (Calculated): 28.55   Intake/Output last 2 shifts:  10/01 0701 - 10/02 0700 In: 255 [P.O.:240; I.V.:5] Out: 2150 [Urine:1850; Drains:300]   Physical Exam:  Constitutional: alert, cooperative and no distress  HENT: normocephalic without obvious abnormality  Eyes: PERRL, EOM's grossly intact and symmetric  Respiratory: breathing non-labored at rest  Cardiovascular: regular rate and sinus rhythm  Gastrointestinal: soft, non-tender, and non-distended. Cholecystostomy tube in RUQ with bilious output Musculoskeletal: no edema or wounds, motor and sensation grossly intact, NT    Labs:  CBC Latest Ref Rng & Units 01/28/2019 01/27/2019 01/26/2019  WBC 4.0 - 10.5 K/uL 10.2 20.1(H) 28.4(H)  Hemoglobin 13.0 -  17.0 g/dL 11.9(L) 13.1 13.8  Hematocrit 39.0 - 52.0 % 34.7(L) 38.2(L) 40.6  Platelets 150 - 400 K/uL 94(L) 91(L) 93(L)   CMP Latest Ref Rng & Units 01/28/2019 01/27/2019 01/26/2019  Glucose 70 - 99 mg/dL 97 103(H) 113(H)  BUN 6 - 20 mg/dL 14 18 18   Creatinine 0.61 - 1.24 mg/dL 0.76 0.78 1.14  Sodium 135 - 145 mmol/L 139 136 137  Potassium 3.5 - 5.1 mmol/L 3.7 4.1 4.6  Chloride 98 - 111 mmol/L 108 107 105  CO2 22 - 32 mmol/L 24 22 23   Calcium 8.9 - 10.3 mg/dL 8.2(L) 8.2(L) 8.2(L)  Total Protein 6.5 - 8.1 g/dL 5.3(L) 5.7(L) 6.1(L)  Total Bilirubin 0.3 - 1.2 mg/dL 2.3(H) 2.7(H) 3.2(H)  Alkaline Phos 38 - 126 U/L 67 61 66  AST 15 - 41 U/L 12(L) 16 21  ALT 0 - 44 U/L 14 18 20      Imaging studies: No new pertinent imaging studies   Assessment/Plan: (ICD-10's: K81.0) 58 y.o. male with resolved leukocytosis and improved abdominal pain attributable to acute cholecysitits s/p percutaneous cholecystostomy tube placement on 01/75, complicated by pertinent comorbidities including NAFLD cirrhosis and thrombocytopenia.   - Continue regular diet  - pain control prn  - monitor abdominal examination; cholecystostomy output   - No indication for emergent intervention surgically. He understands we will follow him outpatient for medical optimization and interval cholecystectomy              - mobilization encouraged              - Appreciate GI input             - further management per primary team     -  Discharge planning: D/W Dr Posey Pronto, okay for discharge with PO Augmentin x10 days, drain management at home, follow up with surgery in 2 weeks   All of the above findings and recommendations were discussed with the patient, and the medical team, and all of patient's questions were answered to her expressed satisfaction.  -- Ryan Simon, PA-C [facility] 01/28/2019, 7:27 AM [phone] M-F: 7am - 4pm

## 2019-01-28 NOTE — Discharge Instructions (Signed)
Cholecystitis ° °Cholecystitis is irritation and swelling (inflammation) of the gallbladder. The gallbladder is an organ that is shaped like a pear. It is under the liver on the right side of the body. This organ stores bile. Bile helps the body break down (digest) the fats in food. °This condition can occur all of a sudden. It needs to be treated. °What are the causes? °This condition may be caused by stones or lumps that form in the gallbladder (gallstones). Gallstones can block the tube (duct) that carries bile out of your gallbladder. °Other causes are: °· Damage to the gallbladder due to less blood flow. °· Germs in the bile ducts. °· Scars or kinks in the bile ducts. °· Abnormal growths (tumors) in the liver, pancreas, or gallbladder. °What increases the risk? °You are more likely to develop this condition if: °· You have sickle cell disease. °· You take birth control pills.  °· You use estrogen. °· You have alcoholic liver disease. °· You have liver cirrhosis. °· You are being fed through a vein. °· You are very ill. °· You do not eat or drink for a long time. This is also called "fasting." °· You are overweight (obese). °· You lose weight too fast. °· You are pregnant. °· You have high levels of fat in the blood (triglycerides). °· You have irritation and swelling of the pancreas (pancreatitis). °What are the signs or symptoms? °Symptoms of this condition include: °· Pain in the belly (abdomen). Pain is often in the upper right area of the belly. °· Tenderness or bloating in the belly. °· Feeling sick to your stomach (nauseous). °· Throwing up (vomiting). °· Fever. °· Chills. °How is this diagnosed? °This condition may be diagnosed with a medical history and exam. You may also have other tests, such as: °· Imaging tests. This may include: °? Ultrasound. °? CT scan of the belly. °? Nuclear scan. This is also called a HIDA scan. This scan lets your doctor see the bile as it moves in your body. °? MRI. °· Blood  tests. These are done to check: °? Your blood count. The white blood cell count may be higher than normal. °? How well your liver works. °How is this treated? °This condition may be treated with: °· Surgery to take out your gallbladder. °· Antibiotic medicines to treat illnesses caused by germs. °· Going without food for some time. °· Giving fluids through an IV tube. °· Medicines to treat pain or throwing up. °Follow these instructions at home: °· If you had surgery, follow instructions from your doctor about how to care for yourself after you go home. °Medicines ° °· Take over-the-counter and prescription medicines only as told by your doctor. °· If you were prescribed an antibiotic medicine, take it as told by your doctor. Do not stop taking it even if you start to feel better. °General instructions °· Follow instructions from your doctor about what to eat or drink. Do not eat or drink anything that makes you sick again. °· Do not lift anything that is heavier than 10 lb (4.5 kg) until your doctor says that it is safe. °· Do not use any products that contain nicotine or tobacco, such as cigarettes and e-cigarettes. If you need help quitting, ask your doctor. °· Keep all follow-up visits as told by your doctor. This is important. °Contact a doctor if: °· You have pain and your medicine does not help. °· You have a fever. °Get help right away if: °·   Your pain moves to: ? Another part of your belly. ? Your back.  Your symptoms do not go away.  You have new symptoms. Summary  Cholecystitis is swelling and irritation of the gallbladder.  This condition may be caused by stones or lumps that form in the gallbladder (gallstones).  Common symptoms are pain in the belly. You may feel sick to your stomach and start throwing up. You may also have a fever and chills.  This condition may be treated with surgery to take out the gallbladder. It may also be treated with medicines, fasting, and fluids through an IV  tube.  Follow what you are told about eating and drinking. Do not eat things that make you sick again. This information is not intended to replace advice given to you by your health care provider. Make sure you discuss any questions you have with your health care provider. Document Released: 04/03/2011 Document Revised: 08/21/2017 Document Reviewed: 08/21/2017 [employer] Patient Education  2020 [employer]  

## 2019-01-28 NOTE — Discharge Summary (Signed)
[facility] NAME: Karl Erway    MR#:  [id number]  DATE OF BIRTH:  03-13-1961  DATE OF ADMISSION:  01/25/2019 ADMITTING PHYSICIAN: Nicholes Mango, MD  DATE OF DISCHARGE: 01/28/2019  PRIMARY CARE PHYSICIAN: Patient, No Pcp Per    ADMISSION DIAGNOSIS:  Acute cholecystitis [K81.0]  DISCHARGE DIAGNOSIS:  Acute cholecystitis status post Cholecystostomy   SECONDARY DIAGNOSIS:   Past Medical History:  Diagnosis Date  . Diverticulitis   . History of kidney stones   . Hypercholesteremia   . Hypothyroidism   . Peyronie's disease   . Renal cyst, left   . Seasonal allergies   . Thrombocytopenia [facility])     HOSPITAL COURSE:   AveryIrbyis a58 y.o.malewith a known history ofkidney stones, recently seen by Dr. Erlene Quan on 01/17/19 for right ureteropelvic junction stone had cystoscopy and stent placement, hyperlipidemia, hypothyroidism, hyperlipidemia and other medical problems is presenting to the ED with a chief complaint of right upper quadrant pain associated with nausea.  1.Acute right-sided abdominal pain from acute cholecystitis -recieved IV fluids and IV Zosyn --change to po augementin as recommended by surgery -Patient was seen and evaluated by surgery Dr. Dahlia Byes who is not considering surgery at this time given the history of liver cirrhosis and thrombocytopenia -s/p cholecystostomy tube placement by IR on sept 30th. -Pain management as needed -patient feels a lot better. -Labs improving including white count 28K--20K--10K  #Liver cirrhosis/NASH -stable.  - Pt follows with Dr Vicente Males as out pt  #Hypothyroidism Patient is not on any Synthyroid we will check his TSH  #Hyperlipidemia  -resume statins at discharge#history of right ureteral stone status post stent placement on nine 2120 by Dr. Erlene Quan  #DVT prophylaxis with SCDs due to chronic TCP  Patient overall feels back to baseline. He is able to tolerate  diet. Surgery okay with patient discharge follow-up as outpatient in two weeks.  CONSULTS OBTAINED:  Treatment Team:  Jules Husbands, MD  DRUG ALLERGIES:  No Known Allergies  DISCHARGE MEDICATIONS:   Allergies as of 01/28/2019   No Known Allergies     Medication List    STOP taking these medications   HYDROcodone-acetaminophen 5-325 MG tablet Commonly known as: NORCO/VICODIN     TAKE these medications   amoxicillin-clavulanate 875-125 MG tablet Commonly known as: AUGMENTIN Take 1 tablet by mouth every 12 (twelve) hours.   docusate sodium 100 MG capsule Commonly known as: COLACE Take 1 capsule (100 mg total) by mouth 2 (two) times daily.   multivitamin with minerals Tabs tablet Take 1 tablet by mouth daily. One-A-Day Multivitamin   oxybutynin 5 MG tablet Commonly known as: DITROPAN Take 1 tablet (5 mg total) by mouth every 8 (eight) hours as needed for bladder spasms.   oxyCODONE 5 MG immediate release tablet Commonly known as: Oxy IR/ROXICODONE Take 1 tablet (5 mg total) by mouth every 6 (six) hours as needed for severe pain or breakthrough pain.   Rewetting Drops Soln Place 1 drop into both eyes daily.   tamsulosin 0.4 MG Caps capsule Commonly known as: Flomax Take 1 capsule (0.4 mg total) by mouth daily.       If you experience worsening of your admission symptoms, develop shortness of breath, life threatening emergency, suicidal or homicidal thoughts you must seek medical attention immediately by calling 911 or calling your MD immediately  if symptoms less severe.  You Must read complete instructions/literature along with all the possible adverse reactions/side effects for  all the Medicines you take and that have been prescribed to you. Take any new Medicines after you have completely understood and accept all the possible adverse reactions/side effects.   Please note  You were cared for by a hospitalist during your hospital stay. If you have any questions  about your discharge medications or the care you received while you were in the hospital after you are discharged, you can call the unit and asked to speak with the hospitalist on call if the hospitalist that took care of you is not available. Once you are discharged, your primary care physician will handle any further medical issues. Please note that NO REFILLS for any discharge medications will be authorized once you are discharged, as it is imperative that you return to your primary care physician (or establish a relationship with a primary care physician if you do not have one) for your aftercare needs so that they can reassess your need for medications and monitor your lab values. Today   SUBJECTIVE  feels good. Eating well.  VITAL SIGNS:  Blood pressure 93/64, pulse 69, temperature 98.4 F (36.9 C), temperature source Oral, resp. rate 20, height 5' 6"  (1.676 m), weight 80.2 kg, SpO2 91 %.  I/O:    Intake/Output Summary (Last 24 hours) at 01/28/2019 1149 Last data filed at 01/28/2019 1023 Gross per 24 hour  Intake 735 ml  Output 2050 ml  Net -1315 ml    PHYSICAL EXAMINATION:  GENERAL:  58 y.o.-year-old patient lying in the bed with no acute distress.  EYES: Pupils equal, round, reactive to light and accommodation. No scleral icterus. Extraocular muscles intact.  HEENT: Head atraumatic, normocephalic. Oropharynx and nasopharynx clear.  NECK:  Supple, no jugular venous distention. No thyroid enlargement, no tenderness.  LUNGS: Normal breath sounds bilaterally, no wheezing, rales,rhonchi or crepitation. No use of accessory muscles of respiration.  CARDIOVASCULAR: S1, S2 normal. No murmurs, rubs, or gallops.  ABDOMEN: Soft, non-tender, non-distended. Bowel sounds present. No organomegaly or mass. Right UQ drain+ EXTREMITIES: No pedal edema, cyanosis, or clubbing.  NEUROLOGIC: Cranial nerves II through XII are intact. Muscle strength 5/5 in all extremities. Sensation intact. Gait not  checked.  PSYCHIATRIC: The patient is alert and oriented x 3.  SKIN: No obvious rash, lesion, or ulcer.   DATA REVIEW:   CBC  Recent Labs  Lab 01/28/19 0329  WBC 10.2  HGB 11.9*  HCT 34.7*  PLT 94*    Chemistries  Recent Labs  Lab 01/28/19 0329  NA 139  K 3.7  CL 108  CO2 24  GLUCOSE 97  BUN 14  CREATININE 0.76  CALCIUM 8.2*  AST 12*  ALT 14  ALKPHOS 67  BILITOT 2.3*    Microbiology Results   Recent Results (from the past 240 hour(s))  Microscopic Examination     Status: Abnormal   Collection Time: 01/24/19  9:20 AM   URINE  Result Value Ref Range Status   WBC, UA 0-5 0 - 5 /hpf Final   RBC 11-30 (A) 0 - 2 /hpf Final   Epithelial Cells (non renal) None seen 0 - 10 /hpf Final   Bacteria, UA None seen None seen/Few Final  CULTURE, URINE COMPREHENSIVE     Status: None   Collection Time: 01/24/19  9:51 AM   Specimen: Urine   UR  Result Value Ref Range Status   Urine Culture, Comprehensive Final report  Final   Organism ID, Bacteria Comment  Final    Comment: No growth  in 36 - 48 hours.  Urine Culture     Status: None   Collection Time: 01/25/19 12:42 PM   Specimen: Urine, Random  Result Value Ref Range Status   Specimen Description   Final    URINE, RANDOM Performed at [facility], [address], [place], [place] [address]    Special Requests   Final    NONE Performed at [facility], [address] [place], [place] [address]    Culture   Final    NO GROWTH Performed at [facility], [place] [address], [place], [place] [address]    Report Status 01/26/2019 FINAL  Final  SARS Coronavirus 2 [facility] order, Performed in [facility] hospital lab) Nasopharyngeal Nasopharyngeal Swab     Status: None   Collection Time: 01/25/19  4:33 PM   Specimen: Nasopharyngeal Swab  Result Value Ref Range Status   SARS Coronavirus 2 NEGATIVE NEGATIVE Final    Comment: (NOTE) If result is NEGATIVE SARS-CoV-2 target nucleic acids are NOT  DETECTED. The SARS-CoV-2 RNA is generally detectable in upper and lower  respiratory specimens during the acute phase of infection. The lowest  concentration of SARS-CoV-2 viral copies this assay can detect is 250  copies / mL. A negative result does not preclude SARS-CoV-2 infection  and should not be used as the sole basis for treatment or other  patient management decisions.  A negative result may occur with  improper specimen collection / handling, submission of specimen other  than nasopharyngeal swab, presence of viral mutation(s) within the  areas targeted by this assay, and inadequate number of viral copies  (<250 copies / mL). A negative result must be combined with clinical  observations, patient history, and epidemiological information. If result is POSITIVE SARS-CoV-2 target nucleic acids are DETECTED. The SARS-CoV-2 RNA is generally detectable in upper and lower  respiratory specimens dur ing the acute phase of infection.  Positive  results are indicative of active infection with SARS-CoV-2.  Clinical  correlation with patient history and other diagnostic information is  necessary to determine patient infection status.  Positive results do  not rule out bacterial infection or co-infection with other viruses. If result is PRESUMPTIVE POSTIVE SARS-CoV-2 nucleic acids MAY BE PRESENT.   A presumptive positive result was obtained on the submitted specimen  and confirmed on repeat testing.  While 2019 novel coronavirus  (SARS-CoV-2) nucleic acids may be present in the submitted sample  additional confirmatory testing may be necessary for epidemiological  and / or clinical management purposes  to differentiate between  SARS-CoV-2 and other Sarbecovirus currently known to infect humans.  If clinically indicated additional testing with an alternate test  methodology [phone]) is advised. The SARS-CoV-2 RNA is generally  detectable in upper and lower respiratory sp ecimens during  the acute  phase of infection. The expected result is Negative. Fact Sheet for Patients:  StrictlyIdeas.no Fact Sheet for Healthcare Providers: BankingDealers.co.za This test is not yet approved or cleared by the [place] FDA and has been authorized for detection and/or diagnosis of SARS-CoV-2 by FDA under an Emergency Use Authorization (EUA).  This EUA will remain in effect (meaning this test can be used) for the duration of the COVID-19 declaration under Section 564(b)(1) of the Act, 21 U.S.C. section 360bbb-3(b)(1), unless the authorization is terminated or revoked sooner. Performed at [facility], [address] [place],  [address]     RADIOLOGY:  [place] Intraoperative  Result Date: 01/26/2019 INDICATION: Acute  cholecystitis. Poor operative candidate. Request made for image placement of a cholecystostomy tube for infection source control purposes EXAM: ULTRASOUND AND CT-GUIDED CHOLECYSTOSTOMY TUBE PLACEMENT COMPARISON:  CT abdomen and pelvis - 01/24/2018 MEDICATIONS: The patient is currently admitted to the hospital and on intravenous antibiotics. Antibiotics were administered within an appropriate time frame prior to skin puncture. ANESTHESIA/SEDATION: Moderate (conscious) sedation was employed during this procedure. A total of Versed 2 mg and Fentanyl 100 mcg was administered intravenously. Moderate Sedation Time: 18 minutes. The patient's level of consciousness and vital signs were monitored continuously by radiology nursing throughout the procedure under my direct supervision. CONTRAST:  None FLUOROSCOPY TIME:  N/A COMPLICATIONS: None immediate. PROCEDURE: Informed written consent was obtained from the patient after a discussion of the risks, benefits and alternatives to treatment. Questions regarding the procedure were encouraged and answered. A timeout was performed prior to the initiation of the procedure. Patient was  positioned supine, slightly LPO on the CT gantry and noncontrast imaging was obtained of the right upper abdominal quadrant. The gallbladder was then identified sonographically. The right upper abdominal quadrant was prepped and draped in the usual sterile fashion, and a sterile drape was applied covering the operative field. Maximum barrier sterile technique with sterile gowns and gloves were used for the procedure. A timeout was performed prior to the initiation of the procedure. Local anesthesia was provided with 1% lidocaine with epinephrine. Ultrasound scanning of the right upper quadrant demonstrates a dilated gallbladder. Utilizing a transhepatic approach, an 18 gauge trocar needle was advanced into the gallbladder under direct ultrasound guidance and a short Amplatz wire was coiled within the gallbladder lumen. An ultrasound image was saved for documentation purposes. Appropriate position was confirmed with CT imaging. Next, the track was dilated ultimately allowing placement of a 10.2-French Cook cholecystomy tube. Limited postprocedural CT imaging demonstrates appropriate positioning of the cholecystostomy tube with end coiled and locked within the gallbladder lumen. The catheter was secured to the skin with suture, connected to a drainage bag and a dressing was placed. The patient tolerated the procedure well without immediate post procedural complication. IMPRESSION: Successful ultrasound and CT guided placement of a 10.2 French cholecystostomy tube. Electronically Signed   By: Sandi Mariscal M.D.   On: 01/26/2019 14:34   Ct Image Guided Fluid Drain By Catheter  Result Date: 01/26/2019 INDICATION: Acute cholecystitis. Poor operative candidate. Request made for image placement of a cholecystostomy tube for infection source control purposes EXAM: ULTRASOUND AND CT-GUIDED CHOLECYSTOSTOMY TUBE PLACEMENT COMPARISON:  CT abdomen and pelvis - 01/24/2018 MEDICATIONS: The patient is currently admitted to the  hospital and on intravenous antibiotics. Antibiotics were administered within an appropriate time frame prior to skin puncture. ANESTHESIA/SEDATION: Moderate (conscious) sedation was employed during this procedure. A total of Versed 2 mg and Fentanyl 100 mcg was administered intravenously. Moderate Sedation Time: 18 minutes. The patient's level of consciousness and vital signs were monitored continuously by radiology nursing throughout the procedure under my direct supervision. CONTRAST:  None FLUOROSCOPY TIME:  N/A COMPLICATIONS: None immediate. PROCEDURE: Informed written consent was obtained from the patient after a discussion of the risks, benefits and alternatives to treatment. Questions regarding the procedure were encouraged and answered. A timeout was performed prior to the initiation of the procedure. Patient was positioned supine, slightly LPO on the CT gantry and noncontrast imaging was obtained of the right upper abdominal quadrant. The gallbladder was then identified sonographically. The right upper abdominal quadrant was prepped and draped in the usual sterile fashion, and  a sterile drape was applied covering the operative field. Maximum barrier sterile technique with sterile gowns and gloves were used for the procedure. A timeout was performed prior to the initiation of the procedure. Local anesthesia was provided with 1% lidocaine with epinephrine. Ultrasound scanning of the right upper quadrant demonstrates a dilated gallbladder. Utilizing a transhepatic approach, an 18 gauge trocar needle was advanced into the gallbladder under direct ultrasound guidance and a short Amplatz wire was coiled within the gallbladder lumen. An ultrasound image was saved for documentation purposes. Appropriate position was confirmed with CT imaging. Next, the track was dilated ultimately allowing placement of a 10.2-French Cook cholecystomy tube. Limited postprocedural CT imaging demonstrates appropriate positioning of  the cholecystostomy tube with end coiled and locked within the gallbladder lumen. The catheter was secured to the skin with suture, connected to a drainage bag and a dressing was placed. The patient tolerated the procedure well without immediate post procedural complication. IMPRESSION: Successful ultrasound and CT guided placement of a 10.2 French cholecystostomy tube. Electronically Signed   By: Sandi Mariscal M.D.   On: 01/26/2019 14:34     CODE STATUS:     Code Status Orders  (From admission, onward)         Start     Ordered   01/25/19 1930  Full code  Continuous     01/25/19 1929        Code Status History    This patient has a current code status but no historical code status.   Advance Care Planning Activity      TOTAL TIME TAKING CARE OF THIS PATIENT: *40* minutes.    Fritzi Mandes M.D on 01/28/2019 at 11:49 AM  Between 7am to 6pm - Pager - [phone] After 6pm go to [email or web address] - password [email or web address] [facility]  Office  [phone]  CC: Primary care physician; Patient, No Pcp Per

## 2019-01-31 ENCOUNTER — Ambulatory Visit
Admission: RE | Admit: 2019-01-31 | Discharge: 2019-01-31 | Disposition: A | Discharge status: Home / Self Care | Payer: 59 | Attending: Urology | Admitting: Urology

## 2019-01-31 ENCOUNTER — Other Ambulatory Visit: Payer: Self-pay

## 2019-01-31 ENCOUNTER — Encounter
Admission: RE | Disposition: A | Discharge status: Home / Self Care | Payer: Self-pay | Source: Home / Self Care | Attending: Urology

## 2019-01-31 ENCOUNTER — Ambulatory Visit: Payer: 59 | Admitting: Anesthesiology

## 2019-01-31 ENCOUNTER — Encounter: Payer: Self-pay | Admitting: *Deleted

## 2019-01-31 ENCOUNTER — Telehealth: Payer: Self-pay | Admitting: *Deleted

## 2019-01-31 DIAGNOSIS — K7469 Other cirrhosis of liver: Secondary | ICD-10-CM | POA: Insufficient documentation

## 2019-01-31 DIAGNOSIS — E7800 Pure hypercholesterolemia, unspecified: Secondary | ICD-10-CM | POA: Diagnosis not present

## 2019-01-31 DIAGNOSIS — D6959 Other secondary thrombocytopenia: Secondary | ICD-10-CM | POA: Insufficient documentation

## 2019-01-31 DIAGNOSIS — N201 Calculus of ureter: Secondary | ICD-10-CM | POA: Diagnosis not present

## 2019-01-31 DIAGNOSIS — Z87442 Personal history of urinary calculi: Secondary | ICD-10-CM | POA: Insufficient documentation

## 2019-01-31 DIAGNOSIS — E039 Hypothyroidism, unspecified: Secondary | ICD-10-CM | POA: Diagnosis not present

## 2019-01-31 DIAGNOSIS — N132 Hydronephrosis with renal and ureteral calculous obstruction: Secondary | ICD-10-CM | POA: Insufficient documentation

## 2019-01-31 DIAGNOSIS — Z466 Encounter for fitting and adjustment of urinary device: Secondary | ICD-10-CM | POA: Diagnosis not present

## 2019-01-31 HISTORY — PX: CYSTOSCOPY/URETEROSCOPY/HOLMIUM LASER/STENT PLACEMENT: SHX6546

## 2019-01-31 SURGERY — CYSTOSCOPY/URETEROSCOPY/HOLMIUM LASER/STENT PLACEMENT
Anesthesia: General | Laterality: Right

## 2019-01-31 MED ORDER — FENTANYL CITRATE (PF) 100 MCG/2ML IJ SOLN
INTRAMUSCULAR | Status: AC
  Filled 2019-01-31: qty 2

## 2019-01-31 MED ORDER — FENTANYL CITRATE (PF) 100 MCG/2ML IJ SOLN: 25.0000 ug | INTRAMUSCULAR | Status: DC | PRN

## 2019-01-31 MED ORDER — FENTANYL CITRATE (PF) 100 MCG/2ML IJ SOLN
INTRAMUSCULAR | Status: DC | PRN
  Administered 2019-01-31: 100 ug via INTRAVENOUS

## 2019-01-31 MED ORDER — MIDAZOLAM HCL 2 MG/2ML IJ SOLN
INTRAMUSCULAR | Status: AC
  Filled 2019-01-31: qty 2

## 2019-01-31 MED ORDER — LIDOCAINE HCL (CARDIAC) PF 100 MG/5ML IV SOSY
PREFILLED_SYRINGE | INTRAVENOUS | Status: DC | PRN
  Administered 2019-01-31: 100 mg via INTRAVENOUS

## 2019-01-31 MED ORDER — SODIUM CHLORIDE 0.9% FLUSH: 10.0000 mL | Freq: Two times a day (BID) | INTRAVENOUS | 0 refills | Status: DC

## 2019-01-31 MED ORDER — CEFAZOLIN SODIUM-DEXTROSE 2-4 GM/100ML-% IV SOLN
2.0000 g | INTRAVENOUS | Status: AC
  Administered 2019-01-31: 2 g via INTRAVENOUS

## 2019-01-31 MED ORDER — FAMOTIDINE 20 MG PO TABS
ORAL_TABLET | ORAL | Status: AC
  Administered 2019-01-31: 20 mg via ORAL
  Filled 2019-01-31: qty 1

## 2019-01-31 MED ORDER — ONDANSETRON HCL 4 MG/2ML IJ SOLN: 4.0000 mg | Freq: Once | INTRAMUSCULAR | Status: DC | PRN

## 2019-01-31 MED ORDER — PROPOFOL 10 MG/ML IV BOLUS
INTRAVENOUS | Status: DC | PRN
  Administered 2019-01-31: 200 mg via INTRAVENOUS

## 2019-01-31 MED ORDER — CEFAZOLIN SODIUM-DEXTROSE 2-4 GM/100ML-% IV SOLN
INTRAVENOUS | Status: AC
  Filled 2019-01-31: qty 100

## 2019-01-31 MED ORDER — ONDANSETRON HCL 4 MG/2ML IJ SOLN
INTRAMUSCULAR | Status: DC | PRN
  Administered 2019-01-31: 4 mg via INTRAVENOUS

## 2019-01-31 MED ORDER — ROCURONIUM BROMIDE 100 MG/10ML IV SOLN
INTRAVENOUS | Status: DC | PRN
  Administered 2019-01-31: 10 mg via INTRAVENOUS
  Administered 2019-01-31: 30 mg via INTRAVENOUS
  Administered 2019-01-31: 10 mg via INTRAVENOUS

## 2019-01-31 MED ORDER — SUGAMMADEX SODIUM 500 MG/5ML IV SOLN
INTRAVENOUS | Status: AC
  Filled 2019-01-31: qty 10

## 2019-01-31 MED ORDER — MIDAZOLAM HCL 2 MG/2ML IJ SOLN
INTRAMUSCULAR | Status: DC | PRN
  Administered 2019-01-31: 2 mg via INTRAVENOUS

## 2019-01-31 MED ORDER — IOHEXOL 180 MG/ML  SOLN
INTRAMUSCULAR | Status: DC | PRN
  Administered 2019-01-31: 20 mL

## 2019-01-31 MED ORDER — FAMOTIDINE 20 MG PO TABS
20.0000 mg | ORAL_TABLET | Freq: Once | ORAL | Status: AC
  Administered 2019-01-31: 11:00:00 20 mg via ORAL

## 2019-01-31 MED ORDER — DEXAMETHASONE SODIUM PHOSPHATE 10 MG/ML IJ SOLN
INTRAMUSCULAR | Status: DC | PRN
  Administered 2019-01-31: 10 mg via INTRAVENOUS

## 2019-01-31 MED ORDER — LACTATED RINGERS IV SOLN: INTRAVENOUS | Status: DC

## 2019-01-31 MED ORDER — PROPOFOL 10 MG/ML IV BOLUS
INTRAVENOUS | Status: AC
  Filled 2019-01-31: qty 40

## 2019-01-31 MED ORDER — SUGAMMADEX SODIUM 200 MG/2ML IV SOLN
INTRAVENOUS | Status: DC | PRN
  Administered 2019-01-31: 200 mg via INTRAVENOUS

## 2019-01-31 MED ORDER — LACTATED RINGERS IV SOLN
INTRAVENOUS | Status: DC | PRN
  Administered 2019-01-31 (×2): via INTRAVENOUS

## 2019-01-31 MED ORDER — PHENYLEPHRINE HCL (PRESSORS) 10 MG/ML IV SOLN
INTRAVENOUS | Status: DC | PRN
  Administered 2019-01-31: 100 ug via INTRAVENOUS

## 2019-01-31 SURGICAL SUPPLY — 29 items
BAG DRAIN CYSTO-URO LG1000N (MISCELLANEOUS) ×3 IMPLANT
BASKET ZERO TIP 1.9FR (BASKET) ×2 IMPLANT
BRUSH SCRUB EZ 1% IODOPHOR (MISCELLANEOUS) ×3 IMPLANT
CATH URETL 5X70 OPEN END (CATHETERS) ×3 IMPLANT
CNTNR SPEC 2.5X3XGRAD LEK (MISCELLANEOUS)
CONT SPEC 4OZ STER OR WHT (MISCELLANEOUS)
CONTAINER SPEC 2.5X3XGRAD LEK (MISCELLANEOUS) IMPLANT
DRAPE UTILITY 15X26 TOWEL STRL (DRAPES) ×3 IMPLANT
FIBER LASER FLEXIVA 365 (UROLOGICAL SUPPLIES) ×2 IMPLANT
FIBER LASER TRAC TIP (UROLOGICAL SUPPLIES) ×2 IMPLANT
GLOVE BIO SURGEON STRL SZ 6.5 (GLOVE) ×2 IMPLANT
GLOVE BIO SURGEONS STRL SZ 6.5 (GLOVE) ×1
GOWN STRL REUS W/ TWL LRG LVL3 (GOWN DISPOSABLE) ×2 IMPLANT
GOWN STRL REUS W/TWL LRG LVL3 (GOWN DISPOSABLE) ×4
GUIDEWIRE GREEN .038 145CM (MISCELLANEOUS) ×2 IMPLANT
GUIDEWIRE STR DUAL SENSOR (WIRE) ×3 IMPLANT
INFUSOR MANOMETER BAG 3000ML (MISCELLANEOUS) ×3 IMPLANT
INTRODUCER DILATOR DOUBLE (INTRODUCER) ×2 IMPLANT
KIT TURNOVER CYSTO (KITS) ×3 IMPLANT
PACK CYSTO AR (MISCELLANEOUS) ×3 IMPLANT
SET CYSTO W/LG BORE CLAMP LF (SET/KITS/TRAYS/PACK) ×3 IMPLANT
SHEATH URETERAL 12FR 45CM (SHEATH) ×2 IMPLANT
SHEATH URETERAL 12FRX35CM (MISCELLANEOUS) IMPLANT
SOL .9 NS 3000ML IRR  AL (IV SOLUTION) ×2
SOL .9 NS 3000ML IRR UROMATIC (IV SOLUTION) ×1 IMPLANT
STENT URET 6FRX24 CONTOUR (STENTS) ×2 IMPLANT
STENT URET 6FRX26 CONTOUR (STENTS) IMPLANT
SURGILUBE 2OZ TUBE FLIPTOP (MISCELLANEOUS) ×3 IMPLANT
WATER STERILE IRR 1000ML POUR (IV SOLUTION) ×3 IMPLANT

## 2019-01-31 NOTE — Transfer of Care (Signed)
Immediate Anesthesia Transfer of Care Note  Patient: Cody Saffold.  Procedure(s) Performed: CYSTOSCOPY/URETEROSCOPY/HOLMIUM LASER/STENT EXCHANGE (Right )  Patient Location: PACU  Anesthesia Type:General  Level of Consciousness: awake  Airway & Oxygen Therapy: Patient Spontanous Breathing  Post-op Assessment: Report given to RN  Post vital signs: stable  Last Vitals:  Vitals Value Taken Time  BP 124/75 01/31/19 1333  Temp    Pulse 73 01/31/19 1335  Resp 18 01/31/19 1335  SpO2 97 % 01/31/19 1335  Vitals shown include unvalidated device data.  Last Pain:  Vitals:   01/31/19 1333  TempSrc:   PainSc: (P) Asleep         Complications: No apparent anesthesia complications

## 2019-01-31 NOTE — Interval H&P Note (Signed)
History and Physical Interval Note:  01/31/2019 11:02 AM  Patient returns today for staged ureteroscopy of a right UPJ stone, unable to advance scope to the level of the stone previously this underwent stenting returns today for definitive management of his stone.  In the interim, he was admitted for acute cholecystitis status post cholecystostomy tube.  He recovered and was discharged on oral Augmentin.  His cholecystostomy tube remains in place.  Most recent urine culture negative.  Regular rate and rhythm Clear to auscultation bilaterally  [name].  has presented today for surgery, with the diagnosis of right ureteropelvic junction stone.  The various methods of treatment have been discussed with the patient and family. After consideration of risks, benefits and other options for treatment, the patient has consented to  Procedure(s): CYSTOSCOPY/URETEROSCOPY/HOLMIUM LASER/STENT EXCHANGE (Right) as a surgical intervention.  The patient's history has been reviewed, patient examined, no change in status, stable for surgery.  I have reviewed the patient's chart and labs.  Questions were answered to the patient's satisfaction.     Hollice Espy

## 2019-01-31 NOTE — Telephone Encounter (Signed)
Patients wife called and was seen by Dr.Pabon in the ER on 01/25/19 and has a Cholecystostomy Tube, she is using 0.9% sodium chloride syringes twice daily-and she stated that they need another prescription for it. The  Pharmacy of choice is [facility] in [place]- needs enough until when he comes into the office on 02/09/19. Please call and advise

## 2019-01-31 NOTE — Telephone Encounter (Signed)
Prescription sent in for more Sodium Chloride Syringes.

## 2019-01-31 NOTE — Discharge Instructions (Signed)
You have a ureteral stent in place.  This is a tube that extends from your kidney to your bladder.  This may cause urinary bleeding, burning with urination, and urinary frequency.  Please call our office or present to the ED if you develop fevers >101 or pain which is not able to be controlled with oral pain medications.  You may be given either Flomax and/ or ditropan to help with bladder spasms and stent pain in addition to pain medications.    Your stent is on a string.  You may untape and pull gently until the entire stent is removed in entirety on 02/03/19.  If you have any questions or concerns, please call our office.  [facility] [address], [place], [place] [address] [phone]  AMBULATORY SURGERY  DISCHARGE INSTRUCTIONS   1) The drugs that you were given will stay in your system until tomorrow so for the next 24 hours you should not:  A) Drive an automobile B) Make any legal decisions C) Drink any alcoholic beverage   2) You may resume regular meals tomorrow.  Today it is better to start with liquids and gradually work up to solid foods.  You may eat anything you prefer, but it is better to start with liquids, then soup and crackers, and gradually work up to solid foods.   3) Please notify your doctor immediately if you have any unusual bleeding, trouble breathing, redness and pain at the surgery site, drainage, fever, or pain not relieved by medication.    4) Additional Instructions:        Please contact your physician with any problems or Same Day Surgery at [phone], Monday through Friday 6 am to 4 pm, or [place] [facility] number at [phone].

## 2019-01-31 NOTE — Anesthesia Postprocedure Evaluation (Signed)
Anesthesia Post Note  Patient: Ryan Willis.  Procedure(s) Performed: CYSTOSCOPY/URETEROSCOPY/HOLMIUM LASER/STENT EXCHANGE (Right )  Patient location during evaluation: PACU Anesthesia Type: General Level of consciousness: awake and alert and oriented Pain management: pain level controlled Vital Signs Assessment: post-procedure vital signs reviewed and stable Respiratory status: spontaneous breathing, nonlabored ventilation and respiratory function stable Cardiovascular status: blood pressure returned to baseline and stable Postop Assessment: no signs of nausea or vomiting Anesthetic complications: no     Last Vitals:  Vitals:   01/31/19 1403 01/31/19 1422  BP: 136/79 123/69  Pulse: 81 79  Resp: 16 16  Temp: 36.9 C (!) 36.3 C  SpO2: 92% 93%    Last Pain:  Vitals:   01/31/19 1422  TempSrc: Temporal  PainSc: 0-No pain                 Riva Sesma

## 2019-01-31 NOTE — Anesthesia Preprocedure Evaluation (Signed)
Anesthesia Evaluation  Patient identified by MRN, date of birth, ID band Patient awake    Reviewed: Allergy & Precautions, NPO status , Patient's Chart, lab work & pertinent test results  History of Anesthesia Complications Negative for: history of anesthetic complications  Airway Mallampati: II  TM Distance: >3 FB Neck ROM: Full    Dental no notable dental hx.    Pulmonary neg pulmonary ROS, neg sleep apnea, neg COPD,    breath sounds clear to auscultation- rhonchi (-) wheezing      Cardiovascular Exercise Tolerance: Good (-) hypertension(-) CAD, (-) Past MI, (-) Cardiac Stents and (-) CABG  Rhythm:Regular Rate:Normal - Systolic murmurs and - Diastolic murmurs    Neuro/Psych neg Seizures negative neurological ROS  negative psych ROS   GI/Hepatic negative GI ROS, (+) Cirrhosis       ,   Endo/Other  neg diabetesHypothyroidism   Renal/GU Renal disease: nephrolithiasis.     Musculoskeletal negative musculoskeletal ROS (+)   Abdominal (+) - obese,   Peds  Hematology negative hematology ROS (+)   Anesthesia Other Findings Past Medical History: No date: Diverticulitis No date: History of kidney stones No date: Hypercholesteremia No date: Hypothyroidism No date: Peyronie's disease No date: Renal cyst, left No date: Seasonal allergies No date: Thrombocytopenia ([place])   Reproductive/Obstetrics                             Anesthesia Physical Anesthesia Plan  ASA: II  Anesthesia Plan: General   Post-op Pain Management:    Induction: Intravenous  PONV Risk Score and Plan: 1 and Ondansetron and Midazolam  Airway Management Planned: Oral ETT  Additional Equipment:   Intra-op Plan:   Post-operative Plan: Extubation in OR  Informed Consent: I have reviewed the patients History and Physical, chart, labs and discussed the procedure including the risks, benefits and alternatives for  the proposed anesthesia with the patient or authorized representative who has indicated his/her understanding and acceptance.     Dental advisory given  Plan Discussed with: CRNA and Anesthesiologist  Anesthesia Plan Comments:         Anesthesia Quick Evaluation

## 2019-01-31 NOTE — Op Note (Signed)
Date of procedure: 01/31/19  Preoperative diagnosis:  1. Right UPJ stone   Postoperative diagnosis:  1. same   Procedure: 1. Right ureteroscopy with laser lithotripsy 2. Right ureteral stent exchange 3. Right retrograde pyelogram 4. Basket extraction of stone fragment  Surgeon: Hollice Espy, MD  Anesthesia: General  Complications: None  Intraoperative findings: 10 mm previously at the UPJ within a mid pole calyx.  Completely fragmented and all fragments removed via basket.  Stent replaced on tether.  Uncomplicated.  EBL: Minimal  Specimens: Stone fragment  Drains: 6 x 24 French double-J ureteral stent on right with tether  Indication: Ryan Willis. is a 58 y.o. patient with 10 mm UPJ stone status post stent placement who returns today for staged procedure to address his stone.  After reviewing the management options for treatment, he elected to proceed with the above surgical procedure(s). We have discussed the potential benefits and risks of the procedure, side effects of the proposed treatment, the likelihood of the patient achieving the goals of the procedure, and any potential problems that might occur during the procedure or recuperation. Informed consent has been obtained.  Description of procedure:  The patient was taken to the operating room and general anesthesia was induced.  The patient was placed in the dorsal lithotomy position, prepped and draped in the usual sterile fashion, and preoperative antibiotics were administered. A preoperative time-out was performed.   A 21 French scope was advanced per urethra into the bladder.  Attention was turned to the right ureter orifice which a ureteral stent was seen emanating.  The distal coil of the stent was grasped and brought to level the urethral meatus.  This was then cannulated using a sensor wire up to level the kidney leaving the wire in place and removing the stent.  A dual-lumen access sheath was used to the mid  ureter where a second Super Stiff wire was introduced as a working wire.  The safety wire snapped in place.  A Cook 12/14 ureteral access sheath was then advanced easily over the Super Stiff wire up to level the proximal ureter.  The inner cannula and working wire were removed.  A dual-lumen digital flexible ureteroscope, 8 [place] was introduced into the level of the kidney.  The stone was encountered a midpole calyx.  A 325 m laser fiber was then used since the settings of 0.2 J and 40 Hz to fragment the stone into about 20 smaller pieces.  Each of these pieces was then extracted using 1.9 [place] tipless nitinol basket.  The entire calyx was cleaned until no residual stone remained.  The mid and upper poles were directly surveyed.  Few additional fragments were removed in the renal pelvis.  A final retrograde pyelogram was performed by injecting contrast through the scope.  This created a roadmap to ensure that each and every calyx was visualized and the stone had been cleared of all stone debris.  Notably, there was minimal hydronephrosis and no extravasation.  The scope was then backed down the length the ureter inspecting along the way removing the access sheath at the same time.  There were no ureteral injuries appreciated as well as no contrast extravasation or residual stone material in the ureter.  The safety wire was then backloaded over rigid cystoscope and a 6 x 24 French double-J ureteral stent was advanced over the safety wire up to level the renal pelvis.  This is partially drawn till focal is noted within the renal pelvis and  well within the bladder.  The bladder was then drained.  The tether was left affixed to the distal coil of the stent which was secured to the patient's glans using Mastisol and Tegaderm.  His foreskin was returned back to the anatomic position after he was cleaned and dried.  He was repositioned supine position, reversed with anesthesia, and taken the PACU in stable  condition.  Plan: He may remove his own stent on Thursday.  He will follow-up in 1 month to see our PA with a renal ultrasound prior.  Hollice Espy, M.D.

## 2019-01-31 NOTE — Anesthesia Post-op Follow-up Note (Signed)
Anesthesia QCDR form completed.        

## 2019-01-31 NOTE — Anesthesia Procedure Notes (Signed)
Procedure Name: Intubation Date/Time: 01/31/2019 12:32 PM Performed by: Leander Rams, CRNA Pre-anesthesia Checklist: Patient identified, Emergency Drugs available, Suction available, Patient being monitored and Timeout performed Patient Re-evaluated:Patient Re-evaluated prior to induction Oxygen Delivery Method: Circle system utilized Induction Type: IV induction Ventilation: Mask ventilation without difficulty Laryngoscope Size: McGraph and 4 Grade View: Grade I Tube size: 7.0 mm Airway Equipment and Method: Stylet Placement Confirmation: ETT inserted through vocal cords under direct vision,  positive ETCO2,  CO2 detector and breath sounds checked- equal and bilateral Secured at: 22 cm Tube secured with: Tape Dental Injury: Teeth and Oropharynx as per pre-operative assessment

## 2019-02-01 ENCOUNTER — Encounter: Payer: Self-pay | Admitting: Urology

## 2019-02-06 ENCOUNTER — Other Ambulatory Visit: Payer: Self-pay | Admitting: Urology

## 2019-02-09 ENCOUNTER — Other Ambulatory Visit: Payer: Self-pay

## 2019-02-09 ENCOUNTER — Encounter: Payer: Self-pay | Admitting: Surgery

## 2019-02-09 ENCOUNTER — Ambulatory Visit (INDEPENDENT_AMBULATORY_CARE_PROVIDER_SITE_OTHER): Payer: 59 | Admitting: Surgery

## 2019-02-09 VITALS — BP 132/84 | HR 90 | Temp 97.9°F | Ht 66.0 in | Wt 169.0 lb

## 2019-02-09 DIAGNOSIS — K810 Acute cholecystitis: Secondary | ICD-10-CM | POA: Diagnosis not present

## 2019-02-09 LAB — CALCULI, WITH PHOTOGRAPH (CLINICAL LAB)
Calcium Oxalate Monohydrate: 100 %
Weight Calculi: 135 mg

## 2019-02-09 NOTE — Patient Instructions (Addendum)
We will get you set up for a cholangiogram. This is scheduled for 03/02/19 at 8:00 am. You will report to the [facility] entrance at 7:45 am that morning.   We get you an appointment with Dr Vicente Males in a couple of weeks. Appt: 03/16/19  Follow up in 1 month and have labs done 5 business days before that at [facility]

## 2019-02-10 ENCOUNTER — Encounter: Payer: Self-pay | Admitting: Surgery

## 2019-02-10 NOTE — Progress Notes (Signed)
[name] is a 58 year old male well-known to me with a history of Karlene Lineman cirrhosis least child's B recently admitted to the hospital for acute cholecystitis.  He is doing much better and he denies any abdominal pain at this time.  No fevers no chills no evidence of biliary obstruction. I have personally reviewed the images including a CT scan showing evidence of cholecystitis with gallstones.  There was small volume ascites.  PE NAD Abd: soft, nt, cholecystostomy tube in place I exchanged for a JP bulb.  No peritonitis no Murphy signs.  No evidence of clinical ascites.  A/P Hx of cholecystitis in a patient with cirrhosis child's B.  We will need to optimize him from the liver perspective and cirrhotic perspective before surgical intervention is attempted.  I will consult Dr. Vicente Males so he can see me in the outpatient setting.  I will also set up for a cholangiogram in about 3 to 4 weeks so we can confirm the patency of the biliary ducts.  No need for surgical intervention at this time.  I will order a CMP CBC and INR to reassess his liver function. I will see him back in 4 weeks or so and hopefully at that the time we will have a better idea and better optimization regarding his cirrhosis so we could potentially proceed with cholecystectomy in the next month or 2. Please note that I spent 40 minutes in this encounter with greater than 50% spent in coordination and counseling of his care

## 2019-02-24 ENCOUNTER — Ambulatory Visit
Admission: RE | Admit: 2019-02-24 | Discharge: 2019-02-24 | Disposition: A | Discharge status: Home / Self Care | Payer: 59 | Source: Ambulatory Visit | Attending: Urology | Admitting: Urology

## 2019-02-24 ENCOUNTER — Other Ambulatory Visit: Payer: Self-pay

## 2019-02-24 DIAGNOSIS — N281 Cyst of kidney, acquired: Secondary | ICD-10-CM | POA: Diagnosis not present

## 2019-02-24 DIAGNOSIS — N201 Calculus of ureter: Secondary | ICD-10-CM | POA: Diagnosis not present

## 2019-02-28 ENCOUNTER — Other Ambulatory Visit: Payer: Self-pay

## 2019-02-28 ENCOUNTER — Encounter: Payer: Self-pay | Admitting: Physician Assistant

## 2019-02-28 ENCOUNTER — Ambulatory Visit (INDEPENDENT_AMBULATORY_CARE_PROVIDER_SITE_OTHER): Payer: 59 | Admitting: Physician Assistant

## 2019-02-28 VITALS — BP 116/69 | HR 60 | Ht 65.0 in | Wt 175.0 lb

## 2019-02-28 DIAGNOSIS — Z87442 Personal history of urinary calculi: Secondary | ICD-10-CM | POA: Diagnosis not present

## 2019-02-28 DIAGNOSIS — N281 Cyst of kidney, acquired: Secondary | ICD-10-CM

## 2019-02-28 DIAGNOSIS — R3912 Poor urinary stream: Secondary | ICD-10-CM | POA: Diagnosis not present

## 2019-02-28 DIAGNOSIS — N401 Enlarged prostate with lower urinary tract symptoms: Secondary | ICD-10-CM

## 2019-02-28 NOTE — Progress Notes (Signed)
02/28/2019 11:07 AM   [name]. [birth year] [id number]  CC: Postop staged right URS  HPI: Ryan Willis. is a 58 y.o. male who presents for postoperative evaluation s/p staged right ureteroscopy with laser lithotripsy with Dr. Erlene Quan on 01/31/2019.  He presented to the ED on 01/12/2019 with a 1-day history of right flank and RLQ pain with nausea and vomiting. CT stone study revealed a 10 mm right UVJ stone with associated right hydronephrosis. Urinalysis with >50 RBCs/hpf and 6-10 WBCs/hpf. WBC 4.3, creatinine 0.98. Urine culture negative.  He was treated with fentanyl, Phenergan, ondansetron, ketorolac, and morphine in the ED and discharged on Percocet.  He subsequently underwent attempted right ureteroscopy with retrograde pyelogram and right ureteral stent placement with Dr. Erlene Quan on 01/17/2019.  Due to the inability to advance the scope beyond the distal ureter despite dilation, she placed a ureteral stent for passive dilation with plans for staged procedure.  He subsequently sought care in the emergency department and was found to have acute cholecystitis.  General surgery decided to defer cholecystectomy due to patient's history of cirrhosis and thrombocytopenia.  Cholecystostomy tube placed by IR on 01/26/2019.  He proceeded with right ureteroscopy with laser lithotripsy, right ureteral stent exchange, right retrograde pyelogram, and basket extraction of stone fragment with Dr. Erlene Quan on 01/31/2019. No complications notes. Ureteral stent placed on a tether for home removal.  In the interim, he reports feeling well. He denies gross hematuria and flank pain.  Follow-up renal ultrasound revealed resolution of right hydronephrosis and stable large left simple renal cyst.  Patient has continued his prescribed Flomax in the setting of his stone episode.  He wonders today if he can stop this.  He does have a PMH BPH, however he reports only minimal voiding symptoms associated with this,  namely weak urinary stream.  He does not believe the Flomax has aided his voiding other than strengthening his stream.  He does believe he is emptying his bladder and denies urinary frequency, urgency, intermittency, hesitation, and nocturia at baseline.  PMH: Past Medical History:  Diagnosis Date  . Diverticulitis   . History of kidney stones   . Hypercholesteremia   . Hypothyroidism   . Peyronie's disease   . Renal cyst, left   . Seasonal allergies   . Thrombocytopenia [facility])     Surgical History: Past Surgical History:  Procedure Laterality Date  . COLONOSCOPY  2006  . COLONOSCOPY WITH PROPOFOL N/A 09/08/2017   Procedure: COLONOSCOPY WITH PROPOFOL;  Surgeon: [name], MD;  Location: [facility] ENDOSCOPY;  Service: Gastroenterology;  Laterality: N/A;  . COLONOSCOPY WITH PROPOFOL N/A 01/01/2018   Procedure: COLONOSCOPY WITH PROPOFOL;  Surgeon: [name], MD;  Location: [facility] ENDOSCOPY;  Service: Gastroenterology;  Laterality: N/A;  . CYSTOSCOPY/URETEROSCOPY/HOLMIUM LASER/STENT PLACEMENT Right 01/17/2019   Procedure: CYSTOSCOPY/URETEROSCOPY/STENT PLACEMENT;  Surgeon: Hollice Espy, MD;  Location: [facility] ORS;  Service: Urology;  Laterality: Right;  . CYSTOSCOPY/URETEROSCOPY/HOLMIUM LASER/STENT PLACEMENT Right 01/31/2019   Procedure: CYSTOSCOPY/URETEROSCOPY/HOLMIUM LASER/STENT EXCHANGE;  Surgeon: Hollice Espy, MD;  Location: [facility] ORS;  Service: Urology;  Laterality: Right;  . ESOPHAGOGASTRODUODENOSCOPY (EGD) WITH PROPOFOL N/A 09/08/2017   Procedure: ESOPHAGOGASTRODUODENOSCOPY (EGD) WITH PROPOFOL;  Surgeon: [name], MD;  Location[facility]COPY;  Service: Gastroenterology;  Laterality: N/A;  . HERNIA REPAIR  1981  . SHOULDER SURGERY Right 2001    Home Medications:  Allergies as of 02/28/2019   No Known Allergies     Medication List       Accurate as of February 28, 2019 11:07 AM. If you have any questions, ask your nurse or doctor.        STOP taking these medications    docusate sodium 100 MG capsule Commonly known as: COLACE Stopped by: Debroah Loop, PA-C   oxybutynin 5 MG tablet Commonly known as: DITROPAN Stopped by: Debroah Loop, PA-C   sodium chloride flush 0.9 % Soln Commonly known as: NS Stopped by: Debroah Loop, PA-C     TAKE these medications   multivitamin with minerals Tabs tablet Take 1 tablet by mouth daily. One-A-Day Multivitamin   Rewetting Drops Soln Place 1 drop into both eyes daily.   tamsulosin 0.4 MG Caps capsule Commonly known as: FLOMAX Take 1 capsule (0.4 mg total) by mouth daily.       Allergies: No Known Allergies  Family History: Family History  Problem Relation Age of Onset  . Kidney cancer Mother   . Colon cancer Father     Social History:  reports that he has never smoked. He has never used smokeless tobacco. He reports previous alcohol use. He reports that he does not use drugs.  ROS: UROLOGY Frequent Urination?: No Hard to postpone urination?: No Burning/pain with urination?: No Get up at night to urinate?: No Leakage of urine?: No Urine stream starts and stops?: No Trouble starting stream?: No Do you have to strain to urinate?: No Blood in urine?: No Urinary tract infection?: No Sexually transmitted disease?: No Injury to kidneys or bladder?: No Painful intercourse?: No Weak stream?: No Erection problems?: No Penile pain?: No  Gastrointestinal Nausea?: No Vomiting?: No Indigestion/heartburn?: No Diarrhea?: No Constipation?: No  Constitutional Fever: No Night sweats?: No Weight loss?: No Fatigue?: No  Skin Skin rash/lesions?: No Itching?: No  Eyes Blurred vision?: No Double vision?: No  Ears/Nose/Throat Sore throat?: No Sinus problems?: No  Hematologic/Lymphatic Swollen glands?: No Easy bruising?: No  Cardiovascular Leg swelling?: No Chest pain?: No  Respiratory Cough?: No Shortness of breath?: No  Endocrine Excessive thirst?: No   Musculoskeletal Back pain?: No Joint pain?: No  Neurological Headaches?: No Dizziness?: No  Psychologic Depression?: No Anxiety?: No  Physical Exam: BP 116/69   Pulse 60   Ht 5\' 5"  (1.651 m)   Wt 175 lb (79.4 kg)   BMI 29.12 kg/m   Constitutional:  Alert and oriented, No acute distress. HEENT: [place] AT, moist mucus membranes.  Trachea midline, no masses. Cardiovascular: No clubbing, cyanosis, or edema. Respiratory: Normal respiratory effort, no increased work of breathing. Skin: No rashes, bruises or suspicious lesions. Neurologic: Grossly intact, no focal deficits, moving all 4 extremities. Psychiatric: Normal mood and affect.  Pertinent Imaging: Results for orders placed during the hospital encounter of 02/24/19  US RENAL   Narrative CLINICAL DATA:  Obstruction of RIGHT ureteropelvic junction due to stone, post RIGHT ureteroscopy  EXAM: RENAL / URINARY TRACT ULTRASOUND COMPLETE  COMPARISON:  CT abdomen and pelvis 01/25/2019  FINDINGS: Right Kidney:  Renal measurements: 10.7 x 5.5 x 5.1 cm = volume: 158 mL. Normal cortical thickness and echogenicity. No mass or hydronephrosis. No definite shadowing calculi.  Left Kidney:  Renal measurements: 10.9 x 4.8 x 4.3 cm = volume: 119 mL. Normal cortical thickness and echogenicity. Large exophytic cyst identified at inferior pole, simple features, 6.8 x 5.1 x 6.5 cm. No hydronephrosis, additional mass or shadowing calcification.  Bladder:  Appears normal for degree of bladder distention.  Other:  N/A  IMPRESSION: Large simple appearing cyst arising from inferior pole LEFT kidney 6.8 cm greatest size.  Otherwise negative exam.   Electronically Signed   By: Lavonia Dana M.D.   On: 02/24/2019 11:45    I personally reviewed the images referenced above and note resolution of right hydronephrosis and presence of a large left simple-appearing renal cyst.  Assessment & Plan:   1. History of nephrolithiasis  58 year old male s/p stage right ureteroscopy with laser lithotripsy.  Resolution of right hydronephrosis per follow-up renal ultrasound.  Patient is known to have bilateral nonobstructing nephrolithiasis.  Patient to return in 1 year for stone surveillance with KUB prior.  I counseled him to contact her office in the meantime if he develops new stone symptoms including flank pain, groin pain, and gross hematuria.  He expressed understanding. - DG Abd 1 View; Future  2. Benign prostatic hyperplasia with weak urinary stream Patient reports mild improvement in weak urinary stream on tamsulosin with no other therapeutic benefit in terms of obstructive voiding symptoms.  Per patient report, symptoms are mild at baseline.  I counseled him that he can discontinue this medication if he does not believe he is getting significant therapeutic benefit from it.  Patient to discontinue this medication and contact the office if he finds his baseline urinary symptoms are more severe than previously believed.  At that time, I will set him up for annual BPH visits and prescribed daily tamsulosin.  He is in agreement with this plan.  3. Renal cyst Simple appearing, per renal ultrasound and CTAP.  No follow-up indicated.  Return in about 1 year (around 02/28/2020) for Stone surveillance with KUB prior.  Debroah Loop, PA-C  [facility] [address], [place], [place] [address] [phone]

## 2019-03-02 ENCOUNTER — Other Ambulatory Visit: Payer: Self-pay

## 2019-03-02 ENCOUNTER — Ambulatory Visit
Admission: RE | Admit: 2019-03-02 | Discharge: 2019-03-02 | Disposition: A | Discharge status: Home / Self Care | Payer: 59 | Source: Ambulatory Visit | Attending: Surgery | Admitting: Surgery

## 2019-03-02 DIAGNOSIS — K810 Acute cholecystitis: Secondary | ICD-10-CM | POA: Diagnosis not present

## 2019-03-02 DIAGNOSIS — K8000 Calculus of gallbladder with acute cholecystitis without obstruction: Secondary | ICD-10-CM | POA: Diagnosis not present

## 2019-03-02 DIAGNOSIS — K7460 Unspecified cirrhosis of liver: Secondary | ICD-10-CM | POA: Diagnosis not present

## 2019-03-02 MED ORDER — IOPAMIDOL (ISOVUE-300) INJECTION 61%
50.0000 mL | Freq: Once | INTRAVENOUS | Status: AC | PRN
  Administered 2019-03-02: 25 mL

## 2019-03-08 DIAGNOSIS — K810 Acute cholecystitis: Secondary | ICD-10-CM | POA: Diagnosis not present

## 2019-03-09 LAB — COMPREHENSIVE METABOLIC PANEL
ALT: 11 IU/L (ref 0–44)
AST: 25 IU/L (ref 0–40)
Albumin/Globulin Ratio: 2 (ref 1.2–2.2)
Albumin: 4.3 g/dL (ref 3.8–4.9)
Alkaline Phosphatase: 91 IU/L (ref 39–117)
BUN/Creatinine Ratio: 15 (ref 9–20)
BUN: 12 mg/dL (ref 6–24)
Bilirubin Total: 1.4 mg/dL — ABNORMAL HIGH (ref 0.0–1.2)
CO2: 22 mmol/L (ref 20–29)
Calcium: 9.4 mg/dL (ref 8.7–10.2)
Chloride: 108 mmol/L — ABNORMAL HIGH (ref 96–106)
Creatinine, Ser: 0.8 mg/dL (ref 0.76–1.27)
GFR calc Af Amer: 113 mL/min/{1.73_m2} (ref >59)
GFR calc non Af Amer: 98 mL/min/{1.73_m2} (ref >59)
Globulin, Total: 2.2 g/dL (ref 1.5–4.5)
Glucose: 97 mg/dL (ref 65–99)
Potassium: 4.6 mmol/L (ref 3.5–5.2)
Sodium: 143 mmol/L (ref 134–144)
Total Protein: 6.5 g/dL (ref 6.0–8.5)

## 2019-03-09 LAB — CBC WITH DIFFERENTIAL/PLATELET
Basophils Absolute: 0 10*3/uL (ref 0.0–0.2)
Basos: 1 %
EOS (ABSOLUTE): 0.1 10*3/uL (ref 0.0–0.4)
Eos: 2 %
Hematocrit: 37.8 % (ref 37.5–51.0)
Hemoglobin: 13.2 g/dL (ref 13.0–17.7)
Immature Grans (Abs): 0 10*3/uL (ref 0.0–0.1)
Immature Granulocytes: 0 %
Lymphocytes Absolute: 0.8 10*3/uL (ref 0.7–3.1)
Lymphs: 27 %
MCH: 33 pg (ref 26.6–33.0)
MCHC: 34.9 g/dL (ref 31.5–35.7)
MCV: 95 fL (ref 79–97)
Monocytes Absolute: 0.2 10*3/uL (ref 0.1–0.9)
Monocytes: 8 %
Neutrophils Absolute: 1.8 10*3/uL (ref 1.4–7.0)
Neutrophils: 62 %
Platelets: 70 10*3/uL — CL (ref 150–450)
RBC: 4 x10E6/uL — ABNORMAL LOW (ref 4.14–5.80)
RDW: 12.1 % (ref 11.6–15.4)
WBC: 2.9 10*3/uL — ABNORMAL LOW (ref 3.4–10.8)

## 2019-03-09 LAB — PROTIME-INR
INR: 1.1 (ref 0.9–1.2)
Prothrombin Time: 12 s (ref 9.1–12.0)

## 2019-03-13 ENCOUNTER — Other Ambulatory Visit: Payer: Self-pay | Admitting: Urology

## 2019-03-14 ENCOUNTER — Ambulatory Visit (INDEPENDENT_AMBULATORY_CARE_PROVIDER_SITE_OTHER): Payer: 59 | Admitting: Surgery

## 2019-03-14 ENCOUNTER — Other Ambulatory Visit: Payer: Self-pay

## 2019-03-14 ENCOUNTER — Encounter: Payer: Self-pay | Admitting: Surgery

## 2019-03-14 VITALS — BP 145/79 | HR 92 | Temp 97.5°F | Resp 14 | Ht 65.0 in | Wt 176.2 lb

## 2019-03-14 DIAGNOSIS — K810 Acute cholecystitis: Secondary | ICD-10-CM | POA: Diagnosis not present

## 2019-03-14 NOTE — Patient Instructions (Addendum)
Please be sure to keep your appointment with Dr.Anna.   Our surgery scheduler will call you within 24/48 hours. Please have the Blue sheet available when speaking with her.

## 2019-03-14 NOTE — Progress Notes (Signed)
Outpatient Surgical Follow Up  03/14/2019  Tajh Livsey. is an 58 y.o. male.   Chief Complaint  Patient presents with  . Routine Post Op    Cholecystostomy    HPI: Mr. Amorin is a 58 year old male with prior history of cholecystitis requiring cholecystostomy tube in the setting of cirrhosis.  I have repeated his laboratory evaluation showing a consider improvement on his bilirubin and liver numbers.  The only abnormality is a total bilirubin of 1.4.  INR creatinine and albumin are completely normal.  He does have significant thrombocytopenia.  Currently showing no signs of active bleeding or easy bruising.  He does have a cholecystostomy tube and I obtain a cholangiogram that I have personally reviewed showing evidence of patent cystic duct.  There was some dislodgment of the cholecystostomy tube is still within the biliary tree. He clinically is doing well without any fevers chills or abdominal pain.  He is taking p.o. well he is having normal bowel movements.  He has been walking and currently is able to perform more than 4 METS of activity without any shortness of breath or chest pain.  Past Medical History:  Diagnosis Date  . Diverticulitis   . History of kidney stones   . Hypercholesteremia   . Hypothyroidism   . Peyronie's disease   . Renal cyst, left   . Seasonal allergies   . Thrombocytopenia ([place])     Past Surgical History:  Procedure Laterality Date  . COLONOSCOPY  2006  . COLONOSCOPY WITH PROPOFOL N/A 09/08/2017   Procedure: COLONOSCOPY WITH PROPOFOL;  Surgeon: [name], MD;  Location: [facility] ENDOSCOPY;  Service: Gastroenterology;  Laterality: N/A;  . COLONOSCOPY WITH PROPOFOL N/A 01/01/2018   Procedure: COLONOSCOPY WITH PROPOFOL;  Surgeon: [name], MD;  Location: [facility] ENDOSCOPY;  Service: Gastroenterology;  Laterality: N/A;  . CYSTOSCOPY/URETEROSCOPY/HOLMIUM LASER/STENT PLACEMENT Right 01/17/2019   Procedure: CYSTOSCOPY/URETEROSCOPY/STENT PLACEMENT;  Surgeon: Hollice Espy, MD;  Location: [facility] ORS;  Service: Urology;  Laterality: Right;  . CYSTOSCOPY/URETEROSCOPY/HOLMIUM LASER/STENT PLACEMENT Right 01/31/2019   Procedure: CYSTOSCOPY/URETEROSCOPY/HOLMIUM LASER/STENT EXCHANGE;  Surgeon: Hollice Espy, MD;  Location: [facility] ORS;  Service: Urology;  Laterality: Right;  . ESOPHAGOGASTRODUODENOSCOPY (EGD) WITH PROPOFOL N/A 09/08/2017   Procedure: ESOPHAGOGASTRODUODENOSCOPY (EGD) WITH PROPOFOL;  Surgeon: [name], MD;  Location: [facility] ENDOSCOPY;  Service: Gastroenterology;  Laterality: N/A;  . HERNIA REPAIR  1981  . SHOULDER SURGERY Right 2001    Family History  Problem Relation Age of Onset  . Kidney cancer Mother   . Colon cancer Father     Social History:  reports that he has never smoked. He has never used smokeless tobacco. He reports previous alcohol use. He reports that he does not use drugs.  Allergies: No Known Allergies  Medications reviewed.    ROS Full ROS performed and is otherwise negative other than what is stated in HPI   BP (!) 145/79   Pulse 92   Temp (!) 97.5 F (36.4 C) (Temporal)   Resp 14   Ht 5' 5"  (1.651 m)   Wt 176 lb 3.2 oz (79.9 kg)   SpO2 96%   BMI 29.32 kg/m   Physical Exam Vitals signs and nursing note reviewed. Exam conducted with a chaperone present.  Constitutional:      General: He is not in acute distress.    Appearance: Normal appearance.  Eyes:     General: No scleral icterus.       Right eye: No discharge.  Left eye: No discharge.  Cardiovascular:     Rate and Rhythm: Normal rate and regular rhythm.     Heart sounds: No murmur.  Pulmonary:     Effort: Pulmonary effort is normal. No respiratory distress.     Breath sounds: No stridor.  Abdominal:     General: Abdomen is flat. There is no distension.     Tenderness: There is no guarding or rebound.     Hernia: No hernia is present.     Comments: Cholecystostomy tube in place with scant amount of bile.  No peritonitis no Murphy sign.   Musculoskeletal: Normal range of motion.  Skin:    General: Skin is warm and dry.     Capillary Refill: Capillary refill takes less than 2 seconds.     Coloration: Skin is not jaundiced.  Neurological:     General: No focal deficit present.     Mental Status: He is alert and oriented to person, place, and time.  Psychiatric:        Mood and Affect: Mood normal.        Behavior: Behavior normal.        Thought Content: Thought content normal.        Judgment: Judgment normal.        Assessment/Plan: Mr. Mamaril is a 58 year old male with history of Karlene Lineman cirrhosis and history of cholecystitis requiring cholecystostomy tube.  He has done very well and has been optimized from a liver perspective.  He does have chronic thrombocytopenia with the latest platelets being 70,000.  All his liver numbers have improved so far.  He is currently a child Pugh a with the only abnormality being the total bilirubin of 1.4. I do think that is reasonable to proceed with laparoscopic cholecystectomy and he will need perioperative platelet transfusion. I discussed the procedure in detail.  The patient was given Neurosurgeon.  We discussed the risks and benefits of a laparoscopic cholecystectomy and possible cholangiogram including, but not limited to bleeding, infection, injury to surrounding structures such as the intestine or liver, bile leak, retained gallstones, need to convert to an open procedure, prolonged diarrhea, blood clots such as  DVT, common bile duct injury, anesthesia risks, and possible need for additional procedures.  The likelihood of improvement in symptoms and return to the patient's normal status is good. We discussed the typical post-operative recovery course. He will be a good candidate for robotic approach.  He does have an appointment with Dr. Vicente Males in a couple of days and I will connect with him unless he is got any other ideas for improving his platelet count I will tentatively  schedule him for cholecystectomy in the near future.  Greater than 50% of the 40 minutes  visit was spent in counseling/coordination of care   Caroleen Hamman, MD [place] Surgeon

## 2019-03-15 ENCOUNTER — Telehealth: Payer: Self-pay | Admitting: Surgery

## 2019-03-15 NOTE — Telephone Encounter (Signed)
Pt has been advised of pre admission date/time, Covid Testing date and Surgery date.  Surgery Date: 04/07/19 with Dr Kris Mouton assisted lap cholecystectomy with gram.  Preadmission Testing Date: 03/29/19 between 8-1:00pm-phone interview.  Covid Testing Date: 04/04/19 between 8-10:30am - patient advised to go to the [facility]  [employer] Video sent via [employer] [facility] [employer] and [employer].  Patient has been made aware to call [phone], between 1-3:00pm the day before surgery, to find out what time to arrive.

## 2019-03-16 ENCOUNTER — Other Ambulatory Visit: Payer: Self-pay

## 2019-03-16 ENCOUNTER — Ambulatory Visit: Payer: 59 | Admitting: Gastroenterology

## 2019-03-16 ENCOUNTER — Encounter: Payer: Self-pay | Admitting: Gastroenterology

## 2019-03-16 VITALS — BP 139/82 | HR 62 | Temp 97.9°F | Ht 65.0 in | Wt 175.2 lb

## 2019-03-16 DIAGNOSIS — K7460 Unspecified cirrhosis of liver: Secondary | ICD-10-CM

## 2019-03-16 NOTE — Progress Notes (Signed)
Patient pre screened for office appointment, no questions or concerns today. Patient reminded of upcoming appointment time and date. 

## 2019-03-16 NOTE — Progress Notes (Signed)
[name] MD, MRCP(U.K) [address]  [place]  [place],  [address]  Main: [phone]  Fax: [phone]   Primary Care Physician: Patient, No Pcp Per  Primary Gastroenterologist:  Dr. [name]   Liver cirrhosis  HPI: Ryan Willis. is a 58 y.o. male   Summary of history :  He carries a diagnosis of liver cirrhosis secondary to NAFLD and last seen at the office in June 2020. RUQ USG in 06/2017 showed a normal liver, splenomegaly andCholelithiasis with mild gallbladder wall thickening. The patient had a CT scan on 07/15/17 which showed features of cirrhosis and portal hypertension withwith splenomegaly, a recanalized left paraumbilical vein and small distal esophageal varices.His dad had colon cancer that went into the liver. 01/01/18 : colonoscopy- 3 mm tubular adenoma resected  09/08/17- EGD: No varicesantral gastropathy on bx.  Labs 09/14/17 : Normal 24 hour urinary copperHe did have eye exam and was told is normal.  08/24/17 : Hep B surface ab- positive, ANA ,SM ab,LKM, immunoglubulins ,celiac serology,A1AT,HIV,Hep C ab,Hep B cv ab , Hep B e ab,HbsAg, , - Normal/Negative. Hb 15 Plt 95, ferritin 188. Immune to Hep A.INR 1.2 ,Cr 0.86  Ceruloplasmin low 15.2.  09/23/2018: MRI- no HCC  Interval history6/04/2018-03/16/2019  Admitted to the hospital in 01/16/2019 with acute cholecystitis felt unsafe to proceed with surgery at that time and was given a cholecystostomy tube by IR.  Follows with Dr. Dahlia Byes and was last seen on 03/14/2019.  Being scheduled for surgery.  01/25/2019: Ultrasound right upper quadrant gallbladder is filled with sludge and there is pericholecystic fluid, increased echogenicity of the liver.  Nodularity of the liver is noted.  No mention of any liver masses.  03/08/2019: CBC: Hemoglobin 13.2 g with a platelet count of 70.  Total bilirubin of 1.4, creatinine 0.8, albumin 4.3, sodium 143, INR 1.1   Sleeps well. Normal bowel movements  . No constipation . Denies any confusion.   No current outpatient medications on file.   No current facility-administered medications for this visit.       Current Outpatient Medications  Medication Sig Dispense Refill  . Multiple Vitamin (MULTIVITAMIN WITH MINERALS) TABS tablet Take 1 tablet by mouth daily. One-A-Day Multivitamin    . Soft Lens Products (REWETTING DROPS) SOLN Place 1 drop into both eyes daily.    . tamsulosin (FLOMAX) 0.4 MG CAPS capsule TAKE 1 CAPSULE BY MOUTH EVERY DAY 30 capsule 0   No current facility-administered medications for this visit.     Allergies as of 03/16/2019  . (No Known Allergies)    ROS:  General: Negative for anorexia, weight loss, fever, chills, fatigue, weakness. ENT: Negative for hoarseness, difficulty swallowing , nasal congestion. CV: Negative for chest pain, angina, palpitations, dyspnea on exertion, peripheral edema.  Respiratory: Negative for dyspnea at rest, dyspnea on exertion, cough, sputum, wheezing.  GI: See history of present illness. GU:  Negative for dysuria, hematuria, urinary incontinence, urinary frequency, nocturnal urination.  Endo: Negative for unusual weight change.    Physical Examination:   There were no vitals taken for this visit.  General: Well-nourished, well-developed in no acute distress.  Eyes: No icterus. Conjunctivae pink. Mouth: Oropharyngeal mucosa moist and pink , no lesions erythema or exudate. Lungs: Clear to auscultation bilaterally. Non-labored. Heart: Regular rate and rhythm, no murmurs rubs or gallops.  Abdomen: Bowel sounds are normal, right upper quadrant biliary drain present with yellow bile no tenderness.  Nontender, nondistended, no hepatosplenomegaly or masses, no abdominal bruits  or hernia , no rebound or guarding.   Extremities: No lower extremity edema. No clubbing or deformities. Neuro: Alert and oriented x 3.  Grossly intact. Skin: Warm and dry, no jaundice.   Psych: Alert and  cooperative, normal mood and affect.   Imaging Studies: US Renal  Result Date: 02/24/2019 CLINICAL DATA:  Obstruction of RIGHT ureteropelvic junction due to stone, post RIGHT ureteroscopy EXAM: RENAL / URINARY TRACT ULTRASOUND COMPLETE COMPARISON:  CT abdomen and pelvis 01/25/2019 FINDINGS: Right Kidney: Renal measurements: 10.7 x 5.5 x 5.1 cm = volume: 158 mL. Normal cortical thickness and echogenicity. No mass or hydronephrosis. No definite shadowing calculi. Left Kidney: Renal measurements: 10.9 x 4.8 x 4.3 cm = volume: 119 mL. Normal cortical thickness and echogenicity. Large exophytic cyst identified at inferior pole, simple features, 6.8 x 5.1 x 6.5 cm. No hydronephrosis, additional mass or shadowing calcification. Bladder: Appears normal for degree of bladder distention. Other: N/A IMPRESSION: Large simple appearing cyst arising from inferior pole LEFT kidney 6.8 cm greatest size. Otherwise negative exam. Electronically Signed   By: Lavonia Dana M.D.   On: 02/24/2019 11:45   Dg Cholangiogram  Existing Tube  Result Date: 03/02/2019 INDICATION: 58 year old male with NASH cirrhosis and a history of acute calculus cholecystitis. At the time of initial presentation, he was a poor operative candidate and was therefore treated with percutaneous cholecystostomy tube placement on 01/26/2019. He presents today for initial cholangiogram. EXAM: Cholangiogram through existing tube MEDICATIONS: None ANESTHESIA/SEDATION: None FLUOROSCOPY TIME:  Fluoroscopy Time: 0 minutes 36 seconds (11.5 mGy). COMPLICATIONS: None immediate. PROCEDURE: Informed written consent was obtained from the patient after a thorough discussion of the procedural risks, benefits and alternatives. All questions were addressed. Maximal Sterile Barrier Technique was utilized including caps, mask, sterile gowns, sterile gloves, sterile drape, hand hygiene and skin antiseptic. A timeout was performed prior to the initiation of the procedure. A  hand injection of contrast was performed under fluoroscopic imaging. The drainage catheter appears to have pulled back into the transhepatic tract. The catheter continues to communicate with the gallbladder lumen. Small filling defects in the gallbladder neck consistent with cholelithiasis. The cystic duct is patent. The common bile duct is patent. Contrast material passes through the ampulla and into the duodenum. Minimal reflux into the distal common pancreatic duct. IMPRESSION: 1. The drainage catheter has partially pulled back into the transhepatic tract but remains in communication with the gallbladder lumen. 2. Cholelithiasis without cystic duct obstruction. 3. No evidence of choledocholithiasis. PLAN: 1. Patient to follow-up with Dr. Perrin Maltese to discuss elective cholecystectomy. 2. Return to interventional radiology the last week of November for cholecystostomy tube exchange. Signed, Criselda Peaches, MD, [facility] Electronically Signed   By: Jacqulynn Cadet M.D.   On: 03/02/2019 12:20    Assessment and Plan:   Ryan Willis. is a 58 y.o. y/o male here to follow up forfor liver cirrhosiswithportal hypertension: Compensated.  Etiology of liver disease likely NAFLD.Immune to Hep A/BDoing well and losing weight  No hepatic encephelopathy. Doing well.  From the optimization of his liver care I think he is as good as he can be.  His child score is A  with 5 points.  His life expectancy based on that is 15 to 20 years and abdominal surgery perioperative mortality is 10%.  Meld score of 9 with a estimated 31-month mortality of 1.9%  Plan  1.strongly suggested him to obtain a flu shot which she has not  yet obtained.  2.EGD to screen for esophagealvarices in 08/2020  3.Abdominal ultrasound to screen for [facility]  March 2021  4.Continue life style changes for NAFLD, avoid alcohol.  He has lost weight and continues to do so  intentionally  Dr [name]  MD,MRCP [facility]) Follow up in 6 months

## 2019-03-17 ENCOUNTER — Inpatient Hospital Stay: Payer: 59 | Attending: Internal Medicine | Admitting: Internal Medicine

## 2019-03-17 ENCOUNTER — Other Ambulatory Visit: Payer: Self-pay

## 2019-03-17 DIAGNOSIS — K7460 Unspecified cirrhosis of liver: Secondary | ICD-10-CM | POA: Insufficient documentation

## 2019-03-17 DIAGNOSIS — E039 Hypothyroidism, unspecified: Secondary | ICD-10-CM | POA: Diagnosis not present

## 2019-03-17 DIAGNOSIS — D696 Thrombocytopenia, unspecified: Secondary | ICD-10-CM | POA: Insufficient documentation

## 2019-03-17 DIAGNOSIS — I10 Essential (primary) hypertension: Secondary | ICD-10-CM | POA: Insufficient documentation

## 2019-03-17 NOTE — Progress Notes (Signed)
[facility] CONSULT NOTE  Patient Care Team: Patient, No Pcp Per as PCP - General (General Practice)  CHIEF COMPLAINTS/PURPOSE OF CONSULTATION: Thrombocytopenia  # THROMBOCYTOPENIA [97-108; incidental]; Hep B/hepC/HIV-NEG [[facility]] sec to cirrhosis/ splenomegaly  # Cirrhosis/ SPLENOMEGALY[US- 1000cc; CT A/P march 2019] s/p EGD/colo [may 2019- Dr.Anna]  #October 2020-kidney stones s/p stenting [Dr. Erlene Quan; gallstone/cholecystitis-cholecystostomy tube [awaiting cholecystectomy December 2020]  # Peyronie disease-   Oncology History   No history exists.     HISTORY OF PRESENTING ILLNESS:  Ryan Willis. 58 y.o.  male cirrhosis/splenomegaly and thrombocytopenia is here for follow-up.  The interim patient was admitted to hospital for kidney stone.  Had a stent placed.  Patient also diagnosed with gallbladder stones; currently had a cholecystostomy tube in place.  Patient awaiting surgery on December 10th.  Overall is currently improved.  No nausea no vomiting.  Appetite is fair.  Concerned about his upcoming surgery with his low platelets.   Review of Systems  Constitutional: Positive for weight loss. Negative for chills, diaphoresis, fever and malaise/fatigue.  HENT: Negative for nosebleeds and sore throat.   Eyes: Negative for double vision.  Respiratory: Negative for cough, hemoptysis, sputum production, shortness of breath and wheezing.   Cardiovascular: Negative for chest pain, palpitations, orthopnea and leg swelling.  Gastrointestinal: Negative for abdominal pain, blood in stool, constipation, diarrhea, heartburn, melena, nausea and vomiting.  Genitourinary: Negative for dysuria, frequency and urgency.  Musculoskeletal: Negative for back pain and joint pain.  Skin: Negative.  Negative for itching and rash.  Neurological: Negative for dizziness, tingling, focal weakness, weakness and headaches.  Endo/Heme/Allergies: Bruises/bleeds easily.   Psychiatric/Behavioral: Negative for depression. The patient is not nervous/anxious and does not have insomnia.     MEDICAL HISTORY:  Past Medical History:  Diagnosis Date  . Diverticulitis   . History of kidney stones   . Hypercholesteremia   . Hypothyroidism   . Peyronie's disease   . Renal cyst, left   . Seasonal allergies   . Thrombocytopenia ([place])     SURGICAL HISTORY: Past Surgical History:  Procedure Laterality Date  . COLONOSCOPY  2006  . COLONOSCOPY WITH PROPOFOL N/A 09/08/2017   Procedure: COLONOSCOPY WITH PROPOFOL;  Surgeon: [name], MD;  Location: [facility] ENDOSCOPY;  Service: Gastroenterology;  Laterality: N/A;  . COLONOSCOPY WITH PROPOFOL N/A 01/01/2018   Procedure: COLONOSCOPY WITH PROPOFOL;  Surgeon: [name], MD;  Location: [facility] ENDOSCOPY;  Service: Gastroenterology;  Laterality: N/A;  . CYSTOSCOPY/URETEROSCOPY/HOLMIUM LASER/STENT PLACEMENT Right 01/17/2019   Procedure: CYSTOSCOPY/URETEROSCOPY/STENT PLACEMENT;  Surgeon: Hollice Espy, MD;  Location: [facility] ORS;  Service: Urology;  Laterality: Right;  . CYSTOSCOPY/URETEROSCOPY/HOLMIUM LASER/STENT PLACEMENT Right 01/31/2019   Procedure: CYSTOSCOPY/URETEROSCOPY/HOLMIUM LASER/STENT EXCHANGE;  Surgeon: Hollice Espy, MD;  Location: [facility] ORS;  Service: Urology;  Laterality: Right;  . ESOPHAGOGASTRODUODENOSCOPY (EGD) WITH PROPOFOL N/A 09/08/2017   Procedure: ESOPHAGOGASTRODUODENOSCOPY (EGD) WITH PROPOFOL;  Surgeon: [name], MD;  Location[facility] ENDOSCOPY;  Service: Gastroenterology;  Laterality: N/A;  . HERNIA REPAIR  1981  . SHOULDER SURGERY Right 2001    SOCIAL HISTORY:  Social History   Socioeconomic History  . Marital status: Married    Spouse name: Not on file  . Number of children: Not on file  . Years of education: Not on file  . Highest education level: Not on file  Occupational History  . Not on file  Social Needs  . Financial resource strain: Not on file  . Food insecurity    Worry: Not on file  Inability: Not on file  . Transportation needs    Medical: Not on file    Non-medical: Not on file  Tobacco Use  . Smoking status: Never Smoker  . Smokeless tobacco: Never Used  Substance and Sexual Activity  . Alcohol use: Not Currently    Comment: "1 or 2 year drinks a year."  . Drug use: No  . Sexual activity: Not on file  Lifestyle  . Physical activity    Days per week: Not on file    Minutes per session: Not on file  . Stress: Not on file  Relationships  . Social Herbalist on phone: Not on file    Gets together: Not on file    Attends religious service: Not on file    Active member of club or organization: Not on file    Attends meetings of clubs or organizations: Not on file    Relationship status: Not on file  . Intimate partner violence    Fear of current or ex partner: Not on file    Emotionally abused: Not on file    Physically abused: Not on file    Forced sexual activity: Not on file  Other Topics Concern  . Not on file  Social History Narrative   retd. Engineer, structural; part time information specialist; never smoked; no alcohol; live in [place]. Lives with wife.     FAMILY HISTORY: colon father- 24 year; mother- kidney cancer; died of lung cancer [2018] Family History  Problem Relation Age of Onset  . Kidney cancer Mother   . Colon cancer Father     ALLERGIES:  has No Known Allergies.  MEDICATIONS:  Current Outpatient Medications  Medication Sig Dispense Refill  . Multiple Vitamin (MULTIVITAMIN WITH MINERALS) TABS tablet Take 1 tablet by mouth daily. One-A-Day Multivitamin    . Soft Lens Products (REWETTING DROPS) SOLN Place 1 drop into both eyes daily as needed (dry contact lenses.).      No current facility-administered medications for this visit.    PHYSICAL EXAMINATION: ECOG PERFORMANCE STATUS: 0 - Asymptomatic  Vitals:   03/17/19 1000  BP: 131/70  Pulse: (!) 59  Resp: 20  Temp: (!) 96.9 F (36.1 C)   Filed Weights    03/17/19 1000  Weight: 174 lb (78.9 kg)   Physical Exam  Constitutional: He is oriented to person, place, and time and well-developed, well-nourished, and in no distress.  HENT:  Head: Normocephalic and atraumatic.  Mouth/Throat: Oropharynx is clear and moist. No oropharyngeal exudate.  Eyes: Pupils are equal, round, and reactive to light.  Neck: Normal range of motion. Neck supple.  Cardiovascular: Normal rate and regular rhythm.  Pulmonary/Chest: No respiratory distress. He has no wheezes.  Abdominal: Soft. Bowel sounds are normal. He exhibits no distension and no mass. There is no abdominal tenderness. There is no rebound and no guarding.  Musculoskeletal: Normal range of motion.        General: No tenderness or edema.  Neurological: He is alert and oriented to person, place, and time.  Skin: Skin is warm.  Psychiatric: Affect normal.     LABORATORY DATA:  I have reviewed the data as listed Lab Results  Component Value Date   WBC 2.9 (L) 03/08/2019   HGB 13.2 03/08/2019   HCT 37.8 03/08/2019   MCV 95 03/08/2019   PLT 70 (LL) 03/08/2019   Recent Labs    01/25/19 1143  01/27/19 0750 01/28/19 0329 03/08/19 0836  NA  --    < >  136 139 143  K  --    < > 4.1 3.7 4.6  CL  --    < > 107 108 108*  CO2  --    < > 22 24 22   GLUCOSE  --    < > 103* 97 97  BUN  --    < > 18 14 12   CREATININE  --    < > 0.78 0.76 0.80  CALCIUM  --    < > 8.2* 8.2* 9.4  GFRNONAA  --    < > >60 >60 98  GFRAA  --    < > >60 >60 113  PROT 7.1   < > 5.7* 5.3* 6.5  ALBUMIN 3.8   < > 2.7* 2.6* 4.3  AST 30   < > 16 12* 25  ALT 25   < > 18 14 11   ALKPHOS 73   < > 61 67 91  BILITOT 2.9*   < > 2.7* 2.3* 1.4*  BILIDIR 0.5*  --   --   --   --   IBILI 2.4*  --   --   --   --    < > = values in this interval not displayed.    RADIOGRAPHIC STUDIES: I have personally reviewed the radiological images as listed and agreed with the findings in the report. US Renal  Result Date: 02/24/2019 CLINICAL  DATA:  Obstruction of RIGHT ureteropelvic junction due to stone, post RIGHT ureteroscopy EXAM: RENAL / URINARY TRACT ULTRASOUND COMPLETE COMPARISON:  CT abdomen and pelvis 01/25/2019 FINDINGS: Right Kidney: Renal measurements: 10.7 x 5.5 x 5.1 cm = volume: 158 mL. Normal cortical thickness and echogenicity. No mass or hydronephrosis. No definite shadowing calculi. Left Kidney: Renal measurements: 10.9 x 4.8 x 4.3 cm = volume: 119 mL. Normal cortical thickness and echogenicity. Large exophytic cyst identified at inferior pole, simple features, 6.8 x 5.1 x 6.5 cm. No hydronephrosis, additional mass or shadowing calcification. Bladder: Appears normal for degree of bladder distention. Other: N/A IMPRESSION: Large simple appearing cyst arising from inferior pole LEFT kidney 6.8 cm greatest size. Otherwise negative exam. Electronically Signed   By: Lavonia Dana M.D.   On: 02/24/2019 11:45   Dg Cholangiogram  Existing Tube  Result Date: 03/02/2019 INDICATION: 58 year old male with NASH cirrhosis and a history of acute calculus cholecystitis. At the time of initial presentation, he was a poor operative candidate and was therefore treated with percutaneous cholecystostomy tube placement on 01/26/2019. He presents today for initial cholangiogram. EXAM: Cholangiogram through existing tube MEDICATIONS: None ANESTHESIA/SEDATION: None FLUOROSCOPY TIME:  Fluoroscopy Time: 0 minutes 36 seconds (11.5 mGy). COMPLICATIONS: None immediate. PROCEDURE: Informed written consent was obtained from the patient after a thorough discussion of the procedural risks, benefits and alternatives. All questions were addressed. Maximal Sterile Barrier Technique was utilized including caps, mask, sterile gowns, sterile gloves, sterile drape, hand hygiene and skin antiseptic. A timeout was performed prior to the initiation of the procedure. A hand injection of contrast was performed under fluoroscopic imaging. The drainage catheter appears to have  pulled back into the transhepatic tract. The catheter continues to communicate with the gallbladder lumen. Small filling defects in the gallbladder neck consistent with cholelithiasis. The cystic duct is patent. The common bile duct is patent. Contrast material passes through the ampulla and into the duodenum. Minimal reflux into the distal common pancreatic duct. IMPRESSION: 1. The drainage catheter has partially pulled back into the transhepatic tract but remains in communication with  the gallbladder lumen. 2. Cholelithiasis without cystic duct obstruction. 3. No evidence of choledocholithiasis. PLAN: 1. Patient to follow-up with Dr. Perrin Maltese to discuss elective cholecystectomy. 2. Return to interventional radiology the last week of November for cholecystostomy tube exchange. Signed, Criselda Peaches, MD, [facility] Radiology Electronically Signed   By: Jacqulynn Cadet M.D.   On: 03/02/2019 12:20    IMPRESSION: 1. Findings are consistent with hepatic cirrhosis and portal hypertension with splenomegaly, a recanalized left paraumbilical vein and small distal esophageal varices. 2. Nonobstructing bilateral renal calculi. No evidence of ureteral calculus or hydronephrosis. 3. Cholelithiasis. 4. Enlarging hiatal hernia containing fat and a small amount of fluid. 5. Distal colonic diverticulosis.   Electronically Signed   By: Richardean Sale M.D.   On: 07/15/2017 15:53   ASSESSMENT & PLAN:   Thrombocytopenia [facility]) # Thrombocytopenia/splenomegaly-Hypersplenism- [AYTKZSW 2019 platelets- between 106-97] November 2020- platelets 70.  Trending down but overall stable.  #Cholecystectomy-awaiting December 10-November 2020 -PT PTT normal; as platelets 70 currently; TPO agents would be indicated-if platelets less than 50 prior to surgery.  However I think is reasonable to proceed with platelet transfusion just prior to surgery to avoid any  catastrophic bleeding situations.  # Cirrhosis/splenomegaly-MRI in May 2020-negative for [facility]; ultrasound-with GI pending 2021.  # DISPOSITION:  # follow up in 6 months; MD, labs -cbc/cmp/afp/Pt/PTT [[facility]]; - Dr.B  Cc; Dr.Pabone  All questions were answered. The patient knows to call the clinic with any problems, questions or concerns.    Cammie Sickle, MD 03/18/2019 7:25 AM

## 2019-03-17 NOTE — Assessment & Plan Note (Addendum)
#   Thrombocytopenia/splenomegaly-Hypersplenism- [January 2019 platelets- between 106-97] November 2020- platelets 70.  Trending down but overall stable.  #Cholecystectomy-awaiting December 10-November 2020 -PT PTT normal; as platelets 70 currently; TPO agents would be indicated-if platelets less than 50 prior to surgery.  However I think is reasonable to proceed with platelet transfusion just prior to surgery to avoid any catastrophic bleeding situations.  # Cirrhosis/splenomegaly-MRI in May 2020-negative for [facility]; ultrasound-with GI pending 2021.  # DISPOSITION:  # follow up in 6 months; MD, labs -cbc/cmp/afp/Pt/PTT [[facility]]; - Dr.B  Cc; Dr.Pabone

## 2019-03-18 ENCOUNTER — Telehealth: Payer: Self-pay

## 2019-03-18 NOTE — Telephone Encounter (Signed)
Received office notes from Charlaine Dalton, MD for 03/17/2019 office visit.  Notes reviewd by Laurey Morale, PA-C (Interim Provider).  AMD

## 2019-03-29 ENCOUNTER — Other Ambulatory Visit: Payer: Self-pay

## 2019-03-29 ENCOUNTER — Encounter
Admission: RE | Admit: 2019-03-29 | Discharge: 2019-03-29 | Disposition: A | Discharge status: Home / Self Care | Payer: 59 | Source: Ambulatory Visit | Attending: Surgery | Admitting: Surgery

## 2019-03-29 ENCOUNTER — Telehealth: Payer: Self-pay | Admitting: Surgery

## 2019-03-29 HISTORY — DX: Unspecified cirrhosis of liver: K74.60

## 2019-03-29 HISTORY — DX: Family history of other specified conditions: Z84.89

## 2019-03-29 NOTE — Patient Instructions (Signed)
Your procedure is scheduled on: Tuesday, December 22 Report to Day Surgery on the 2nd floor of the [facility]. To find out your arrival time, please call [phone] between 1PM - 3PM on: Monday, December 21  REMEMBER: Instructions that are not followed completely may result in serious medical risk, up to and including death; or upon the discretion of your surgeon and anesthesiologist your surgery may need to be rescheduled.  Do not eat food after midnight the night before surgery.  No gum chewing, lozengers or hard candies.  You may however, drink CLEAR liquids up to 2 hours before you are scheduled to arrive for your surgery. Do not drink anything within 2 hours of the start of your surgery.  Clear liquids include: - water  - apple juice without pulp - gatorade - black coffee or tea (Do NOT add milk or creamers to the coffee or tea) Do NOT drink anything that is not on this list.  No Alcohol for 24 hours before or after surgery.  No Smoking including e-cigarettes for 24 hours prior to surgery.  No chewable tobacco products for at least 6 hours prior to surgery.  No nicotine patches on the day of surgery.  On the morning of surgery brush your teeth with toothpaste and water, you may rinse your mouth with mouthwash if you wish. Do not swallow any toothpaste or mouthwash.  Notify your doctor if there is any change in your medical condition (cold, fever, infection).  Do not wear jewelry, make-up, hairpins, clips or nail polish.  Do not wear lotions, powders, or perfumes.   Do not shave 48 hours prior to surgery.   Contacts and dentures may not be worn into surgery.  Do not bring valuables to the hospital, including drivers license, insurance or credit cards.  [facility] is not responsible for any belongings or valuables.   TAKE THESE MEDICATIONS THE MORNING OF SURGERY:  NONE  Use CHG Soap as directed on instruction sheet.  NOW!  Stop Anti-inflammatories (NSAIDS) such  as Advil, Aleve, Ibuprofen, Motrin, Naproxen, Naprosyn and Aspirin based products such as Excedrin, Goodys Powder, BC Powder. (May take Tylenol or Acetaminophen if needed.)  NOW!  Stop ANY OVER THE COUNTER supplements until after surgery. (May continue multivitamin.)  Wear comfortable clothing (specific to your surgery type) to the hospital.  Plan for stool softeners for home use.  If you are being discharged the day of surgery, you will not be allowed to drive home. You will need a responsible adult to drive you home and stay with you that night.   If you are taking public transportation, you will need to have a responsible adult with you. Please confirm with your physician that it is acceptable to use public transportation.   Please call [phone] if you have any questions about these instructions.

## 2019-03-29 NOTE — Telephone Encounter (Signed)
Patient's surgery date has changed from 04/07/19 to 04/19/19 with Dr Dahlia Byes. Patient is ok with change.   Pt has been advised of pre admission date/time, Covid Testing date and Surgery date.  Surgery Date: 04/19/19 with Dr Kris Mouton assisted laparoscopic cholecystectomy with gram.  Preadmission Testing Date: 03/29/19-phone interview.  Covid Testing Date: 04/15/19 between 8-10:30am - patient advised to go to the [place] ([place]  [employer] Video sent via [employer] [facility] and [employer].  Patient has been made aware to call [phone], between 1-3:00pm the day before surgery, to find out what time to arrive.

## 2019-04-04 ENCOUNTER — Other Ambulatory Visit: Payer: 59

## 2019-04-15 ENCOUNTER — Other Ambulatory Visit
Admission: RE | Admit: 2019-04-15 | Discharge: 2019-04-15 | Disposition: A | Discharge status: Home / Self Care | Payer: Managed Care, Other (non HMO) | Source: Ambulatory Visit | Attending: Surgery | Admitting: Surgery

## 2019-04-15 DIAGNOSIS — Z20828 Contact with and (suspected) exposure to other viral communicable diseases: Secondary | ICD-10-CM | POA: Diagnosis not present

## 2019-04-15 DIAGNOSIS — Z01812 Encounter for preprocedural laboratory examination: Secondary | ICD-10-CM | POA: Diagnosis not present

## 2019-04-15 LAB — CBC
HCT: 43.5 % (ref 39.0–52.0)
Hemoglobin: 14.6 g/dL (ref 13.0–17.0)
MCH: 32.7 pg (ref 26.0–34.0)
MCHC: 33.6 g/dL (ref 30.0–36.0)
MCV: 97.5 fL (ref 80.0–100.0)
Platelets: 76 10*3/uL — ABNORMAL LOW (ref 150–400)
RBC: 4.46 MIL/uL (ref 4.22–5.81)
RDW: 13 % (ref 11.5–15.5)
WBC: 3 10*3/uL — ABNORMAL LOW (ref 4.0–10.5)
nRBC: 0 % (ref 0.0–0.2)

## 2019-04-15 LAB — TYPE AND SCREEN
ABO/RH(D): O POS
Antibody Screen: NEGATIVE

## 2019-04-15 LAB — SARS CORONAVIRUS 2 (TAT 6-24 HRS): SARS Coronavirus 2: NEGATIVE

## 2019-04-18 MED ORDER — INDOCYANINE GREEN 25 MG IV SOLR
5.0000 mg | Freq: Once | INTRAVENOUS | Status: AC
  Administered 2019-04-19: 5 mg via INTRAVENOUS
  Filled 2019-04-18: qty 25
  Filled 2019-04-18: qty 5

## 2019-04-18 MED ORDER — INDOCYANINE GREEN 25 MG IV SOLR: 5.0000 mg | Freq: Once | INTRAVENOUS | Status: DC

## 2019-04-18 NOTE — Addendum Note (Signed)
Addended by: Caroleen Hamman F on: 04/18/2019 03:32 PM   Modules accepted: Orders

## 2019-04-19 ENCOUNTER — Ambulatory Visit: Payer: 59 | Admitting: Certified Registered"

## 2019-04-19 ENCOUNTER — Other Ambulatory Visit: Payer: Self-pay

## 2019-04-19 ENCOUNTER — Encounter: Payer: Self-pay | Admitting: Surgery

## 2019-04-19 ENCOUNTER — Encounter
Admission: AD | Disposition: A | Discharge status: Home / Self Care | Payer: Self-pay | Source: Home / Self Care | Attending: Surgery

## 2019-04-19 ENCOUNTER — Inpatient Hospital Stay
Admission: AD | Admit: 2019-04-19 | Discharge: 2019-04-20 | DRG: 415 | Disposition: A | Discharge status: Home / Self Care | Payer: 59 | Attending: Surgery | Admitting: Surgery

## 2019-04-19 ENCOUNTER — Ambulatory Visit: Payer: 59

## 2019-04-19 DIAGNOSIS — K8020 Calculus of gallbladder without cholecystitis without obstruction: Secondary | ICD-10-CM | POA: Diagnosis not present

## 2019-04-19 DIAGNOSIS — K819 Cholecystitis, unspecified: Secondary | ICD-10-CM | POA: Diagnosis present

## 2019-04-19 DIAGNOSIS — E039 Hypothyroidism, unspecified: Secondary | ICD-10-CM | POA: Diagnosis present

## 2019-04-19 DIAGNOSIS — K8010 Calculus of gallbladder with chronic cholecystitis without obstruction: Secondary | ICD-10-CM | POA: Diagnosis not present

## 2019-04-19 DIAGNOSIS — K7581 Nonalcoholic steatohepatitis (NASH): Secondary | ICD-10-CM | POA: Diagnosis not present

## 2019-04-19 DIAGNOSIS — K766 Portal hypertension: Secondary | ICD-10-CM | POA: Diagnosis present

## 2019-04-19 DIAGNOSIS — K7460 Unspecified cirrhosis of liver: Secondary | ICD-10-CM | POA: Diagnosis present

## 2019-04-19 DIAGNOSIS — K810 Acute cholecystitis: Secondary | ICD-10-CM | POA: Diagnosis not present

## 2019-04-19 DIAGNOSIS — D696 Thrombocytopenia, unspecified: Secondary | ICD-10-CM | POA: Diagnosis not present

## 2019-04-19 DIAGNOSIS — Z5331 Laparoscopic surgical procedure converted to open procedure: Secondary | ICD-10-CM

## 2019-04-19 DIAGNOSIS — E7800 Pure hypercholesterolemia, unspecified: Secondary | ICD-10-CM | POA: Diagnosis not present

## 2019-04-19 DIAGNOSIS — K811 Chronic cholecystitis: Secondary | ICD-10-CM | POA: Diagnosis not present

## 2019-04-19 DIAGNOSIS — Z20828 Contact with and (suspected) exposure to other viral communicable diseases: Secondary | ICD-10-CM | POA: Diagnosis not present

## 2019-04-19 HISTORY — PX: INTRAOPERATIVE CHOLANGIOGRAM: SHX5230

## 2019-04-19 LAB — PLATELET COUNT: Platelets: 68 10*3/uL — ABNORMAL LOW (ref 150–400)

## 2019-04-19 LAB — CBC
HCT: 35.3 % — ABNORMAL LOW (ref 39.0–52.0)
Hemoglobin: 12.1 g/dL — ABNORMAL LOW (ref 13.0–17.0)
MCH: 33 pg (ref 26.0–34.0)
MCHC: 34.3 g/dL (ref 30.0–36.0)
MCV: 96.2 fL (ref 80.0–100.0)
Platelets: 88 10*3/uL — ABNORMAL LOW (ref 150–400)
RBC: 3.67 MIL/uL — ABNORMAL LOW (ref 4.22–5.81)
RDW: 13 % (ref 11.5–15.5)
WBC: 9.1 10*3/uL (ref 4.0–10.5)
nRBC: 0 % (ref 0.0–0.2)

## 2019-04-19 LAB — COMPREHENSIVE METABOLIC PANEL
ALT: 57 U/L — ABNORMAL HIGH (ref 0–44)
AST: 76 U/L — ABNORMAL HIGH (ref 15–41)
Albumin: 3.7 g/dL (ref 3.5–5.0)
Alkaline Phosphatase: 57 U/L (ref 38–126)
Anion gap: 11 (ref 5–15)
BUN: 15 mg/dL (ref 6–20)
CO2: 18 mmol/L — ABNORMAL LOW (ref 22–32)
Calcium: 8.5 mg/dL — ABNORMAL LOW (ref 8.9–10.3)
Chloride: 109 mmol/L (ref 98–111)
Creatinine, Ser: 0.82 mg/dL (ref 0.61–1.24)
GFR calc Af Amer: >60 mL/min (ref >60)
GFR calc non Af Amer: >60 mL/min (ref >60)
Glucose, Bld: 140 mg/dL — ABNORMAL HIGH (ref 70–99)
Potassium: 4.4 mmol/L (ref 3.5–5.1)
Sodium: 138 mmol/L (ref 135–145)
Total Bilirubin: 1.7 mg/dL — ABNORMAL HIGH (ref 0.3–1.2)
Total Protein: 6 g/dL — ABNORMAL LOW (ref 6.5–8.1)

## 2019-04-19 LAB — ABO/RH: ABO/RH(D): O POS

## 2019-04-19 SURGERY — CHOLECYSTECTOMY, ROBOT-ASSISTED, LAPAROSCOPIC
Anesthesia: General

## 2019-04-19 MED ORDER — SODIUM CHLORIDE 0.9 % IV SOLN
INTRAVENOUS | Status: DC | PRN
  Administered 2019-04-19: 70 mL

## 2019-04-19 MED ORDER — ALBUMIN HUMAN 5 % IV SOLN: INTRAVENOUS | Status: DC | PRN

## 2019-04-19 MED ORDER — ONDANSETRON HCL 4 MG/2ML IJ SOLN: 4.0000 mg | Freq: Four times a day (QID) | INTRAMUSCULAR | Status: DC | PRN

## 2019-04-19 MED ORDER — ROCURONIUM BROMIDE 50 MG/5ML IV SOLN
INTRAVENOUS | Status: AC
  Filled 2019-04-19: qty 3

## 2019-04-19 MED ORDER — PROPOFOL 10 MG/ML IV BOLUS
INTRAVENOUS | Status: DC | PRN
  Administered 2019-04-19: 140 mg via INTRAVENOUS

## 2019-04-19 MED ORDER — METHOCARBAMOL 500 MG PO TABS: 500.0000 mg | ORAL_TABLET | Freq: Four times a day (QID) | ORAL | Status: DC | PRN

## 2019-04-19 MED ORDER — SODIUM CHLORIDE 0.9 % IV SOLN: INTRAVENOUS | Status: DC

## 2019-04-19 MED ORDER — EPHEDRINE SULFATE 50 MG/ML IJ SOLN
INTRAMUSCULAR | Status: DC | PRN
  Administered 2019-04-19: 5 mg via INTRAVENOUS

## 2019-04-19 MED ORDER — ONDANSETRON 4 MG PO TBDP: 4.0000 mg | ORAL_TABLET | Freq: Four times a day (QID) | ORAL | Status: DC | PRN

## 2019-04-19 MED ORDER — GABAPENTIN 300 MG PO CAPS: 300.0000 mg | ORAL_CAPSULE | ORAL | Status: AC

## 2019-04-19 MED ORDER — FENTANYL CITRATE (PF) 100 MCG/2ML IJ SOLN
INTRAMUSCULAR | Status: DC | PRN
  Administered 2019-04-19: 50 ug via INTRAVENOUS
  Administered 2019-04-19: 100 ug via INTRAVENOUS
  Administered 2019-04-19 (×3): 50 ug via INTRAVENOUS

## 2019-04-19 MED ORDER — CELECOXIB 200 MG PO CAPS: 200.0000 mg | ORAL_CAPSULE | ORAL | Status: AC

## 2019-04-19 MED ORDER — SODIUM CHLORIDE 0.9 % IV SOLN
INTRAVENOUS | Status: DC | PRN
  Administered 2019-04-19: 16 mL

## 2019-04-19 MED ORDER — KETOROLAC TROMETHAMINE 15 MG/ML IJ SOLN
15.0000 mg | Freq: Four times a day (QID) | INTRAMUSCULAR | Status: DC
  Administered 2019-04-19 – 2019-04-20 (×3): 15 mg via INTRAVENOUS
  Filled 2019-04-19 (×3): qty 1

## 2019-04-19 MED ORDER — OXYCODONE HCL 5 MG PO TABS: 5.0000 mg | ORAL_TABLET | ORAL | Status: DC | PRN

## 2019-04-19 MED ORDER — FENTANYL CITRATE (PF) 100 MCG/2ML IJ SOLN
INTRAMUSCULAR | Status: AC
  Administered 2019-04-19: 25 ug via INTRAVENOUS
  Filled 2019-04-19: qty 2

## 2019-04-19 MED ORDER — SUGAMMADEX SODIUM 200 MG/2ML IV SOLN
INTRAVENOUS | Status: DC | PRN
  Administered 2019-04-19: 200 mg via INTRAVENOUS

## 2019-04-19 MED ORDER — ONDANSETRON HCL 4 MG/2ML IJ SOLN: 4.0000 mg | Freq: Once | INTRAMUSCULAR | Status: DC | PRN

## 2019-04-19 MED ORDER — ACETAMINOPHEN 325 MG PO TABS
325.0000 mg | ORAL_TABLET | ORAL | Status: DC
  Administered 2019-04-19 – 2019-04-20 (×4): 325 mg via ORAL
  Filled 2019-04-19 (×5): qty 1

## 2019-04-19 MED ORDER — VISTASEAL 10 ML SINGLE DOSE KIT
PACK | CUTANEOUS | Status: AC
  Filled 2019-04-19: qty 10

## 2019-04-19 MED ORDER — LIDOCAINE HCL (CARDIAC) PF 100 MG/5ML IV SOSY
PREFILLED_SYRINGE | INTRAVENOUS | Status: DC | PRN
  Administered 2019-04-19: 80 mg via INTRAVENOUS

## 2019-04-19 MED ORDER — CEFAZOLIN SODIUM-DEXTROSE 2-4 GM/100ML-% IV SOLN
INTRAVENOUS | Status: AC
  Filled 2019-04-19: qty 100

## 2019-04-19 MED ORDER — FAMOTIDINE 20 MG PO TABS
ORAL_TABLET | ORAL | Status: AC
  Administered 2019-04-19: 20 mg via ORAL
  Filled 2019-04-19: qty 1

## 2019-04-19 MED ORDER — ONDANSETRON HCL 4 MG/2ML IJ SOLN
INTRAMUSCULAR | Status: AC
  Filled 2019-04-19: qty 2

## 2019-04-19 MED ORDER — LIDOCAINE HCL (PF) 2 % IJ SOLN
INTRAMUSCULAR | Status: AC
  Filled 2019-04-19: qty 10

## 2019-04-19 MED ORDER — FAMOTIDINE 20 MG PO TABS: 20.0000 mg | ORAL_TABLET | Freq: Once | ORAL | Status: AC

## 2019-04-19 MED ORDER — SODIUM CHLORIDE 0.9% IV SOLUTION: Freq: Once | INTRAVENOUS | Status: DC

## 2019-04-19 MED ORDER — ACETAMINOPHEN 500 MG PO TABS: 1000.0000 mg | ORAL_TABLET | ORAL | Status: AC

## 2019-04-19 MED ORDER — EPINEPHRINE PF 1 MG/ML IJ SOLN
INTRAMUSCULAR | Status: AC
  Filled 2019-04-19: qty 1

## 2019-04-19 MED ORDER — ONDANSETRON HCL 4 MG/2ML IJ SOLN
INTRAMUSCULAR | Status: DC | PRN
  Administered 2019-04-19: 4 mg via INTRAVENOUS

## 2019-04-19 MED ORDER — CHLORHEXIDINE GLUCONATE CLOTH 2 % EX PADS
6.0000 | MEDICATED_PAD | Freq: Once | CUTANEOUS | Status: AC
  Administered 2019-04-19: 6 via TOPICAL

## 2019-04-19 MED ORDER — DEXAMETHASONE SODIUM PHOSPHATE 10 MG/ML IJ SOLN
INTRAMUSCULAR | Status: DC | PRN
  Administered 2019-04-19: 5 mg via INTRAVENOUS

## 2019-04-19 MED ORDER — GABAPENTIN 300 MG PO CAPS
ORAL_CAPSULE | ORAL | Status: AC
  Administered 2019-04-19: 300 mg via ORAL
  Filled 2019-04-19: qty 1

## 2019-04-19 MED ORDER — FENTANYL CITRATE (PF) 100 MCG/2ML IJ SOLN
INTRAMUSCULAR | Status: AC
  Filled 2019-04-19: qty 4

## 2019-04-19 MED ORDER — ROCURONIUM BROMIDE 50 MG/5ML IV SOLN
INTRAVENOUS | Status: AC
  Filled 2019-04-19: qty 1

## 2019-04-19 MED ORDER — PHENYLEPHRINE HCL (PRESSORS) 10 MG/ML IV SOLN
INTRAVENOUS | Status: AC
  Filled 2019-04-19: qty 1

## 2019-04-19 MED ORDER — FENTANYL CITRATE (PF) 100 MCG/2ML IJ SOLN
INTRAMUSCULAR | Status: AC
  Filled 2019-04-19: qty 2

## 2019-04-19 MED ORDER — FENTANYL CITRATE (PF) 100 MCG/2ML IJ SOLN
25.0000 ug | INTRAMUSCULAR | Status: AC | PRN
  Administered 2019-04-19 (×6): 25 ug via INTRAVENOUS

## 2019-04-19 MED ORDER — SODIUM CHLORIDE (PF) 0.9 % IJ SOLN
INTRAMUSCULAR | Status: AC
  Filled 2019-04-19: qty 50

## 2019-04-19 MED ORDER — VISTASEAL 10 ML SINGLE DOSE KIT
PACK | CUTANEOUS | Status: DC | PRN
  Administered 2019-04-19: 10 mL via TOPICAL

## 2019-04-19 MED ORDER — PHENYLEPHRINE HCL-NACL 20-0.9 MG/250ML-% IV SOLN
INTRAVENOUS | Status: DC | PRN
  Administered 2019-04-19: 50 ug/min via INTRAVENOUS

## 2019-04-19 MED ORDER — ACETAMINOPHEN 500 MG PO TABS
ORAL_TABLET | ORAL | Status: AC
  Administered 2019-04-19: 1000 mg via ORAL
  Filled 2019-04-19: qty 2

## 2019-04-19 MED ORDER — CEFAZOLIN SODIUM-DEXTROSE 2-4 GM/100ML-% IV SOLN
2.0000 g | INTRAVENOUS | Status: AC
  Administered 2019-04-19: 2 g via INTRAVENOUS

## 2019-04-19 MED ORDER — SUGAMMADEX SODIUM 200 MG/2ML IV SOLN
INTRAVENOUS | Status: AC
  Filled 2019-04-19: qty 2

## 2019-04-19 MED ORDER — PROPOFOL 10 MG/ML IV BOLUS
INTRAVENOUS | Status: AC
  Filled 2019-04-19: qty 20

## 2019-04-19 MED ORDER — ROCURONIUM BROMIDE 100 MG/10ML IV SOLN
INTRAVENOUS | Status: DC | PRN
  Administered 2019-04-19: 10 mg via INTRAVENOUS
  Administered 2019-04-19: 50 mg via INTRAVENOUS
  Administered 2019-04-19 (×3): 20 mg via INTRAVENOUS

## 2019-04-19 MED ORDER — BUPIVACAINE-EPINEPHRINE (PF) 0.25% -1:200000 IJ SOLN
INTRAMUSCULAR | Status: DC | PRN
  Administered 2019-04-19: 20 mL

## 2019-04-19 MED ORDER — ALBUMIN HUMAN 5 % IV SOLN
INTRAVENOUS | Status: AC
  Filled 2019-04-19: qty 500

## 2019-04-19 MED ORDER — BUPIVACAINE LIPOSOME 1.3 % IJ SUSP
INTRAMUSCULAR | Status: AC
  Filled 2019-04-19: qty 20

## 2019-04-19 MED ORDER — MORPHINE SULFATE (PF) 2 MG/ML IV SOLN: 2.0000 mg | INTRAVENOUS | Status: DC | PRN

## 2019-04-19 MED ORDER — CHLORHEXIDINE GLUCONATE CLOTH 2 % EX PADS
6.0000 | MEDICATED_PAD | Freq: Once | CUTANEOUS | Status: AC
  Administered 2019-04-18: 6 via TOPICAL

## 2019-04-19 MED ORDER — LACTATED RINGERS IV SOLN: INTRAVENOUS | Status: DC

## 2019-04-19 MED ORDER — SODIUM CHLORIDE 0.9 % IV SOLN: INTRAVENOUS | Status: DC | PRN

## 2019-04-19 MED ORDER — PHENYLEPHRINE HCL (PRESSORS) 10 MG/ML IV SOLN
INTRAVENOUS | Status: DC | PRN
  Administered 2019-04-19 (×3): 100 ug via INTRAVENOUS

## 2019-04-19 MED ORDER — SODIUM CHLORIDE FLUSH 0.9 % IV SOLN
INTRAVENOUS | Status: AC
  Filled 2019-04-19: qty 10

## 2019-04-19 MED ORDER — CELECOXIB 200 MG PO CAPS
ORAL_CAPSULE | ORAL | Status: AC
  Administered 2019-04-19: 200 mg via ORAL
  Filled 2019-04-19: qty 1

## 2019-04-19 MED ORDER — BUPIVACAINE HCL (PF) 0.25 % IJ SOLN
INTRAMUSCULAR | Status: AC
  Filled 2019-04-19: qty 30

## 2019-04-19 SURGICAL SUPPLY — 61 items
BULB RESERV EVAC DRAIN JP 100C (MISCELLANEOUS) ×1 IMPLANT
CANISTER SUCT 1200ML W/VALVE (MISCELLANEOUS) ×2 IMPLANT
CHLORAPREP W/TINT 26 (MISCELLANEOUS) ×2 IMPLANT
CLIP VESOLOCK MED LG 6/CT (CLIP) ×3 IMPLANT
COVER BACK TABLE REUSABLE LG (DRAPES) ×1 IMPLANT
COVER LIGHT HANDLE STERIS (MISCELLANEOUS) ×1 IMPLANT
COVER WAND RF STERILE (DRAPES) ×2 IMPLANT
DECANTER SPIKE VIAL GLASS SM (MISCELLANEOUS) ×2 IMPLANT
DEFOGGER SCOPE WARMER CLEARIFY (MISCELLANEOUS) ×2 IMPLANT
DERMABOND ADVANCED (GAUZE/BANDAGES/DRESSINGS) ×1
DERMABOND ADVANCED .7 DNX12 (GAUZE/BANDAGES/DRESSINGS) ×1 IMPLANT
DRAIN CHANNEL JP 19F (MISCELLANEOUS) ×1 IMPLANT
DRAPE 3/4 80X56 (DRAPES) ×2 IMPLANT
DRAPE ARM DVNC X/XI (DISPOSABLE) ×4 IMPLANT
DRAPE COLUMN DVNC XI (DISPOSABLE) ×1 IMPLANT
DRAPE DA VINCI XI ARM (DISPOSABLE) ×4
DRAPE DA VINCI XI COLUMN (DISPOSABLE) ×1
DRSG OPSITE POSTOP 3X4 (GAUZE/BANDAGES/DRESSINGS) ×4 IMPLANT
ELECT CAUTERY BLADE 6.4 (BLADE) ×2 IMPLANT
ELECT REM PT RETURN 9FT ADLT (ELECTROSURGICAL) ×2
ELECTRODE REM PT RTRN 9FT ADLT (ELECTROSURGICAL) ×1 IMPLANT
GLOVE BIO SURGEON STRL SZ7 (GLOVE) ×11 IMPLANT
GOWN STRL REUS W/ TWL LRG LVL3 (GOWN DISPOSABLE) ×4 IMPLANT
GOWN STRL REUS W/TWL LRG LVL3 (GOWN DISPOSABLE) ×5
HANDLE YANKAUER SUCT BULB TIP (MISCELLANEOUS) ×1 IMPLANT
IRRIGATION STRYKERFLOW (MISCELLANEOUS) IMPLANT
IRRIGATOR STRYKERFLOW (MISCELLANEOUS)
IRRIGATOR SUCT 8 DISP DVNC XI (IRRIGATION / IRRIGATOR) IMPLANT
IRRIGATOR SUCTION 8MM XI DISP (IRRIGATION / IRRIGATOR) ×1
IV NS 1000ML (IV SOLUTION)
IV NS 1000ML BAXH (IV SOLUTION) IMPLANT
KIT PINK PAD W/HEAD ARE REST (MISCELLANEOUS) ×2
KIT PINK PAD W/HEAD ARM REST (MISCELLANEOUS) ×1 IMPLANT
LABEL OR SOLS (LABEL) ×2 IMPLANT
LIGASURE IMPACT 36 18CM CVD LR (INSTRUMENTS) ×1 IMPLANT
NEEDLE HYPO 22GX1.5 SAFETY (NEEDLE) ×2 IMPLANT
NS IRRIG 500ML POUR BTL (IV SOLUTION) ×2 IMPLANT
OBTURATOR OPTICAL STANDARD 8MM (TROCAR) ×1
OBTURATOR OPTICAL STND 8 DVNC (TROCAR) ×1
OBTURATOR OPTICALSTD 8 DVNC (TROCAR) ×1 IMPLANT
PACK LAP CHOLECYSTECTOMY (MISCELLANEOUS) ×2 IMPLANT
PENCIL ELECTRO HAND CTR (MISCELLANEOUS) ×2 IMPLANT
POUCH SPECIMEN RETRIEVAL 10MM (ENDOMECHANICALS) ×2 IMPLANT
SEAL CANN UNIV 5-8 DVNC XI (MISCELLANEOUS) ×4 IMPLANT
SEAL XI 5MM-8MM UNIVERSAL (MISCELLANEOUS) ×4
SOLUTION ELECTROLUBE (MISCELLANEOUS) ×2 IMPLANT
SPONGE DRAIN TRACH 4X4 STRL 2S (GAUZE/BANDAGES/DRESSINGS) ×1 IMPLANT
SPONGE KITTNER 5P (MISCELLANEOUS) ×2 IMPLANT
SPONGE LAP 18X18 RF (DISPOSABLE) ×4 IMPLANT
SPONGE LAP 4X18 RFD (DISPOSABLE) ×2 IMPLANT
STAPLER SKIN PROX 35W (STAPLE) ×1 IMPLANT
SUT ETHILON 3-0 FS-10 30 BLK (SUTURE) ×2
SUT MNCRL AB 4-0 PS2 18 (SUTURE) ×2 IMPLANT
SUT PDS AB 0 CT1 27 (SUTURE) ×3 IMPLANT
SUT SILK 2 0SH CR/8 30 (SUTURE) ×1 IMPLANT
SUT VICRYL 0 AB UR-6 (SUTURE) ×4 IMPLANT
SUTURE EHLN 3-0 FS-10 30 BLK (SUTURE) IMPLANT
TOWEL OR 17X26 4PK STRL BLUE (TOWEL DISPOSABLE) ×1 IMPLANT
TROCAR 130MM GELPORT  DAV (MISCELLANEOUS) ×2 IMPLANT
TROCAR XCEL NON-BLD 5MMX100MML (ENDOMECHANICALS) ×1 IMPLANT
TUBING EVAC SMOKE HEATED PNEUM (TUBING) ×2 IMPLANT

## 2019-04-19 NOTE — OR Nursing (Signed)
Dr Andree Elk and Dr Dahlia Byes notified of am platelet count.

## 2019-04-19 NOTE — Op Note (Signed)
Attempted Robotic assisted laparoscopic Cholecystectomy Open Cholecystectomy   Pre-operative Diagnosis: Chronic cholecystitis, Hx GS pancreatitis, cirrhosis, thrombocytopenia  Post-operative Diagnosis: same   Procedure:  Attempted Robotic assisted laparoscopic Cholecystectomy Open Cholecystectomy   Surgeon: Caroleen Hamman, MD FACS  Anesthesia: Gen. with endotracheal tube  Findings: Chronic mild Cholecystitis  Cirrhosis of the liver with evidence of portal hypertension evidenced by patent umbilical artery and vein. Severe inflammatory reaction around the cystic duct/infundibulum junction and very close to the common bile duct.  Because of these anatomical concerns we performed a subtotal cholecystectomy so we will avoid any potential common bile duct injuries that would be an extremely devastating situation in this complicated patient  Assistant: Dr. Peyton Najjar and Mr Freda Jackson required due to significant challenges with exposure and difficulty of the case  Estimated Blood Loss: 600cc       Specimens: Gallbladder           Complications: none   Procedure Details  The patient was seen again in the Holding Room. The benefits, complications, treatment options, and expected outcomes were discussed with the patient. The risks of bleeding, infection, recurrence of symptoms, failure to resolve symptoms, bile duct damage, bile duct leak, retained common bile duct stone, bowel injury, any of which could require further surgery and/or ERCP, stent, or papillotomy were reviewed with the patient. The likelihood of improving the patient's symptoms with return to their baseline status is good.  The patient and/or family concurred with the proposed plan, giving informed consent.  The patient was taken to Operating Room, identified  and the procedure verified as Laparoscopic Cholecystectomy.  A Time Out was held and the above information confirmed.  Prior to the induction of general anesthesia, antibiotic  prophylaxis was administered. VTE prophylaxis was in place. General endotracheal anesthesia was then administered and tolerated well.  He did have active prior cholecystostomy tube and I placed Conrad contrast via the cholecystostomy tube in a sterile fashion.  There was opacification of the drain but there was no opacification of the biliary system concerning of dislodging of the cholecystostomy tube.  I went ahead and remove the cholecystostomy tube in the standard fashion after cutting the suture  The abdomen was prepped with Chloraprep and draped in the sterile fashion. The patient was positioned in the supine position..  Cut down technique was used to enter the abdominal cavity and a Hasson trochar was placed after two vicryl stitches were anchored to the fascia. Pneumoperitoneum was then created with CO2 and tolerated well without any adverse changes in the patient's vital signs.  Three 8-mm ports were placed under direct vision. All skin incisions  were infiltrated with a local anesthetic agent before making the incision and placing the trocars.   The patient was positioned  in reverse Trendelenburg, robot was brought to the surgical field and docked in the standard fashion.  We made sure all the instrumentation was kept indirect view at all times and that there were no collision between the arms. I scrubbed out and went to the console. There was significant adhesions from the omentum to the abdominal wall.  Please also note there patent umbilical vessels, and to the falciform making the dissection more challenging.  I performed meticulous dissection but every surface of his body oozed secondary to thrombocytopenia.  Please also note that he already had 1 unit of platelets preoperatively.  I asked anesthesia to give him a second pack of  platelet since there was significant oozing.  I continued my dissection  and actually I used the robotic suction device to be able to identify the gallbladder. This  case was very difficult given the significant chronic inflammatory response. Given the limitations from exposure and diffuse bruising I decided that the safest route will be to convert to an open cholecystectomy. Robotic instruments and robotic arms were undocked in the standard fashion and the pneumoperitoneum was evacuated.  I scrubbed back in.  Right subcostal incision was created with a 10 blade knife and the fascia was incised and the muscle was divided with electrocautery. Placed the Bookwalter retractor in the standard fashion and actually first packed the liver and the gallbladder fossa.  There is really no single vessel that was pumping or actively oozing but there was just diffuse oozing from the some of the liver bed and the omentum.  Meticulous dissection was performed and I was able to feel gallstones.  I was able also to identify the fundus of the gallbladder but again the infundibulum the cystic duct where very inflamed.  At this point I decided to open the gallbladder wall anteriorly and inspected.  I was it was obvious that I was within the lumen of the gallbladder.  I removed 3 stones.  I did a dome down technique and I decided to do a partial cholecystectomy given the significant inflammatory response close to the common bile duct.  I was able to place a pursestring from the inside of the gallbladder wall obliterating the cystic duct.   no evidence of any bile leak.  I used Evaseal within the gallbladder bed.  Also decided to place a 19 Blake drain within the gallbladder bed this was secured to the abdominal wall using a 3-0 nylon suture.  The anterior and posterior fascial layers of the bowel wall were closed with running 0 PDS sutures.  I was also able to use liposomal Marcaine within the abdominal wall for postoperative analgesia.  The skin incisions were closed with staples and the periumbilical fascia was closed with a 0 PDS suture.  The patient was then extubated and brought to  the recovery room in stable condition. Sponge, lap, and needle counts were correct at closure and at the conclusion of the case.               Caroleen Hamman, MD, FACS

## 2019-04-19 NOTE — H&P (Signed)
Chief Complaint  Patient presents with  . Routine Post Op    Cholecystostomy    HPI: Ryan Willis is a 58 year old male with prior history of cholecystitis requiring cholecystostomy tube in the setting of cirrhosis.  I have repeated his laboratory evaluation showing a consider improvement on his bilirubin and liver numbers.  The only abnormality is a total bilirubin of 1.4.  INR creatinine and albumin are completely normal.  He does have significant thrombocytopenia.  Currently showing no signs of active bleeding or easy bruising.  He does have a cholecystostomy tube and I obtain a cholangiogram that I have personally reviewed showing evidence of patent cystic duct.  There was some dislodgment of the cholecystostomy tube is still within the biliary tree. He clinically is doing well without any fevers chills or abdominal pain.  He is taking p.o. well he is having normal bowel movements.  He has been walking and currently is able to perform more than 4 METS of activity without any shortness of breath or chest pain. Now w minimal output from drain      Past Medical History:  Diagnosis Date  . Diverticulitis   . History of kidney stones   . Hypercholesteremia   . Hypothyroidism   . Peyronie's disease   . Renal cyst, left   . Seasonal allergies   . Thrombocytopenia ([place])          Past Surgical History:  Procedure Laterality Date  . COLONOSCOPY  2006  . COLONOSCOPY WITH PROPOFOL N/A 09/08/2017   Procedure: COLONOSCOPY WITH PROPOFOL;  Surgeon: [name], MD;  Location: [facility] ENDOSCOPY;  Service: Gastroenterology;  Laterality: N/A;  . COLONOSCOPY WITH PROPOFOL N/A 01/01/2018   Procedure: COLONOSCOPY WITH PROPOFOL;  Surgeon: [name], MD;  Location: [facility] ENDOSCOPY;  Service: Gastroenterology;  Laterality: N/A;  . CYSTOSCOPY/URETEROSCOPY/HOLMIUM LASER/STENT PLACEMENT Right 01/17/2019   Procedure: CYSTOSCOPY/URETEROSCOPY/STENT PLACEMENT;  Surgeon: Hollice Espy, MD;  Location:  [facility] ORS;  Service: Urology;  Laterality: Right;  . CYSTOSCOPY/URETEROSCOPY/HOLMIUM LASER/STENT PLACEMENT Right 01/31/2019   Procedure: CYSTOSCOPY/URETEROSCOPY/HOLMIUM LASER/STENT EXCHANGE;  Surgeon: Hollice Espy, MD;  Location: [facility] ORS;  Service: Urology;  Laterality: Right;  . ESOPHAGOGASTRODUODENOSCOPY (EGD) WITH PROPOFOL N/A 09/08/2017   Procedure: ESOPHAGOGASTRODUODENOSCOPY (EGD) WITH PROPOFOL;  Surgeon: [name], MD;  Location: [facility] ENDOSCOPY;  Service: Gastroenterology;  Laterality: N/A;  . HERNIA REPAIR  1981  . SHOULDER SURGERY Right 2001         Family History  Problem Relation Age of Onset  . Kidney cancer Mother   . Colon cancer Father     Social History:  reports that he has never smoked. He has never used smokeless tobacco. He reports previous alcohol use. He reports that he does not use drugs.  Allergies: No Known Allergies  Medications reviewed.    ROS Full ROS performed and is otherwise negative other than what is stated in HPI  Physical Exam Vitals signs and nursing note reviewed. Exam conducted with a chaperone present.  Constitutional:      General: He is not in acute distress.    Appearance: Normal appearance.  Eyes:     General: No scleral icterus.       Right eye: No discharge.        Left eye: No discharge.  Cardiovascular:     Rate and Rhythm: Normal rate and regular rhythm.     Heart sounds: No murmur.  Pulmonary:     Effort: Pulmonary effort is normal. No respiratory distress.     Breath  sounds: No stridor.  Abdominal:     General: Abdomen is flat. There is no distension.     Tenderness: There is no guarding or rebound.     Hernia: No hernia is present.     Comments: Cholecystostomy tube in place with scant amount of bile.  No peritonitis no Murphy sign.  Musculoskeletal: Normal range of motion.  Skin:    General: Skin is warm and dry.     Capillary Refill: Capillary refill takes less than 2 seconds.     Coloration:  Skin is not jaundiced.  Neurological:     General: No focal deficit present.     Mental Status: He is alert and oriented to person, place, and time.  Psychiatric:        Mood and Affect: Mood normal.        Behavior: Behavior normal.        Thought Content: Thought content normal.        Judgment: Judgment normal.        Assessment/Plan: [name] is a 58 year old male with history of Ryan Willis cirrhosis and history of cholecystitis requiring cholecystostomy tube.  He has done very well and has been optimized from a liver perspective.  He does have chronic thrombocytopenia with the latest platelets being 70,000.  All his liver numbers have improved so far.  He is currently a child Pugh A with the only abnormality being the total bilirubin of 1.4. I do think that is reasonable to proceed with laparoscopic cholecystectomy and he will need perioperative platelet transfusion. I discussed the procedure in detail.  The patient was given Neurosurgeon.  We discussed the risks and benefits of a laparoscopic cholecystectomy and possible cholangiogram including, but not limited to bleeding, infection, injury to surrounding structures such as the intestine or liver, bile leak, retained gallstones, need to convert to an open procedure, prolonged diarrhea, blood clots such as  DVT, common bile duct injury, anesthesia risks, and possible need for additional procedures.  The likelihood of improvement in symptoms and return to the patient's normal status is good. We discussed the typical post-operative recovery course. He will be a good candidate for robotic approach. HE will get PL perioperatively given his PL count. All questions answered  Caroleen Hamman, MD [place] Surgeon

## 2019-04-19 NOTE — Anesthesia Postprocedure Evaluation (Signed)
Anesthesia Post Note  Patient: Sael Furches.  Procedure(s) Performed: XI ROBOTIC ASSISTED LAPAROSCOPIC CHOLECYSTECTOMY CONVERTED TO OPEN PROCEDURE (N/A ) INTRAOPERATIVE CHOLANGIOGRAM (N/A )  Patient location during evaluation: PACU Anesthesia Type: General Level of consciousness: awake and alert Pain management: pain level controlled Vital Signs Assessment: post-procedure vital signs reviewed and stable Respiratory status: spontaneous breathing, nonlabored ventilation and respiratory function stable Cardiovascular status: blood pressure returned to baseline and stable Postop Assessment: no apparent nausea or vomiting Anesthetic complications: no     Last Vitals:  Vitals:   04/19/19 1441 04/19/19 1443  BP:  111/70  Pulse: (!) 104 95  Resp: 15 12  Temp:    SpO2: 95% 94%    Last Pain:  Vitals:   04/19/19 1441  TempSrc:   PainSc: [place]

## 2019-04-19 NOTE — Anesthesia Post-op Follow-up Note (Signed)
Anesthesia QCDR form completed.        

## 2019-04-19 NOTE — OR Nursing (Signed)
CRNA, Jonni Sanger transferring pt to OR with platelets ongoing. NAD noted with pt

## 2019-04-19 NOTE — Anesthesia Procedure Notes (Signed)
Procedure Name: Intubation Date/Time: 04/19/2019 9:58 AM Performed by: Chanetta Marshall, CRNA Pre-anesthesia Checklist: Patient identified, Emergency Drugs available, Suction available and Patient being monitored Patient Re-evaluated:Patient Re-evaluated prior to induction Oxygen Delivery Method: Circle system utilized Preoxygenation: Pre-oxygenation with 100% oxygen Induction Type: IV induction Ventilation: Mask ventilation without difficulty Grade View: Grade II Tube type: Oral Tube size: 7.5 mm Number of attempts: 1 Airway Equipment and Method: Stylet and Video-laryngoscopy Placement Confirmation: ETT inserted through vocal cords under direct vision,  positive ETCO2,  breath sounds checked- equal and bilateral and CO2 detector Secured at: 22 cm Tube secured with: Tape Dental Injury: Teeth and Oropharynx as per pre-operative assessment

## 2019-04-19 NOTE — Anesthesia Preprocedure Evaluation (Signed)
Anesthesia Evaluation  Patient identified by MRN, date of birth, ID band Patient awake    Reviewed: Allergy & Precautions, H&P , NPO status , Patient's Chart, lab work & pertinent test results, reviewed documented beta blocker date and time   Airway Mallampati: II  TM Distance: >3 FB Neck ROM: full    Dental  (+) Teeth Intact   Pulmonary neg pulmonary ROS,    Pulmonary exam normal        Cardiovascular Exercise Tolerance: Good negative cardio ROS Normal cardiovascular exam Rhythm:regular Rate:Normal     Neuro/Psych negative neurological ROS  negative psych ROS   GI/Hepatic negative GI ROS, Neg liver ROS,   Endo/Other  negative endocrine ROSHypothyroidism   Renal/GU Renal diseasenegative Renal ROS  negative genitourinary   Musculoskeletal   Abdominal   Peds  Hematology negative hematology ROS (+)   Anesthesia Other Findings Past Medical History: No date: Cirrhosis of liver (HCC) No date: Diverticulitis No date: Family history of adverse reaction to anesthesia     Comment:  MOTHER HAD N/V No date: History of kidney stones No date: Hypercholesteremia No date: Hypothyroidism No date: Peyronie's disease No date: Renal cyst, left No date: Seasonal allergies No date: Thrombocytopenia ([place]) Past Surgical History: 2006: COLONOSCOPY 09/08/2017: COLONOSCOPY WITH PROPOFOL; N/A     Comment:  Procedure: COLONOSCOPY WITH PROPOFOL;  Surgeon: [name], MD;  Location: [facility] ENDOSCOPY;  Service:               Gastroenterology;  Laterality: N/A; 01/01/2018: COLONOSCOPY WITH PROPOFOL; N/A     Comment:  Procedure: COLONOSCOPY WITH PROPOFOL;  Surgeon: [name], MD;  Location: [facility] ENDOSCOPY;  Service:               Gastroenterology;  Laterality: N/A; 01/17/2019: CYSTOSCOPY/URETEROSCOPY/HOLMIUM LASER/STENT PLACEMENT;  Right     Comment:  Procedure: CYSTOSCOPY/URETEROSCOPY/STENT PLACEMENT;           Surgeon: Hollice Espy, MD;  Location: [facility] ORS;                Service: Urology;  Laterality: Right; 01/31/2019: CYSTOSCOPY/URETEROSCOPY/HOLMIUM LASER/STENT PLACEMENT;  Right     Comment:  Procedure: [id number]/DGLOVFI LASER/STENT               EXCHANGE;  Surgeon: Hollice Espy, MD;  Location: [facility]               ORS;  Service: Urology;  Laterality: Right; 09/08/2017: ESOPHAGOGASTRODUODENOSCOPY (EGD) WITH PROPOFOL; N/A     Comment:  Procedure: ESOPHAGOGASTRODUODENOSCOPY (EGD) WITH               PROPOFOL;  Surgeon: [name], MD;  Location: [facility]               ENDOSCOPY;  Service: Gastroenterology;  Laterality: N/A; 1981: HERNIA REPAIR 2001: SHOULDER SURGERY; Right     Comment:  ROTATOR CUFF REPAIR   Reproductive/Obstetrics negative OB ROS                             Anesthesia Physical Anesthesia Plan  ASA: II  Anesthesia Plan: General ETT   Post-op Pain Management:    Induction:   PONV Risk Score and Plan:   Airway Management Planned:   Additional Equipment:   Intra-op Plan:   Post-operative Plan:  Informed Consent: I have reviewed the patients History and Physical, chart, labs and discussed the procedure including the risks, benefits and alternatives for the proposed anesthesia with the patient or authorized representative who has indicated his/her understanding and acceptance.     Dental Advisory Given  Plan Discussed with: CRNA  Anesthesia Plan Comments:         Anesthesia Quick Evaluation

## 2019-04-19 NOTE — Transfer of Care (Signed)
Immediate Anesthesia Transfer of Care Note  Patient: Ryan Willis.  Procedure(s) Performed: XI ROBOTIC ASSISTED LAPAROSCOPIC CHOLECYSTECTOMY CONVERTED TO OPEN PROCEDURE (N/A ) INTRAOPERATIVE CHOLANGIOGRAM (N/A )  Patient Location: PACU  Anesthesia Type:General  Level of Consciousness: awake, alert  and oriented  Airway & Oxygen Therapy: Patient Spontanous Breathing and Patient connected to face mask oxygen  Post-op Assessment: Report given to RN and Post -op Vital signs reviewed and stable  Post vital signs: Reviewed and stable  Last Vitals:  Vitals Value Taken Time  BP    Temp    Pulse    Resp    SpO2      Last Pain:  Vitals:   04/19/19 0915  TempSrc: Tympanic  PainSc:          Complications: No apparent anesthesia complications

## 2019-04-20 LAB — COMPREHENSIVE METABOLIC PANEL
ALT: 43 U/L (ref 0–44)
AST: 51 U/L — ABNORMAL HIGH (ref 15–41)
Albumin: 3.2 g/dL — ABNORMAL LOW (ref 3.5–5.0)
Alkaline Phosphatase: 45 U/L (ref 38–126)
Anion gap: 7 (ref 5–15)
BUN: 14 mg/dL (ref 6–20)
CO2: 25 mmol/L (ref 22–32)
Calcium: 8.2 mg/dL — ABNORMAL LOW (ref 8.9–10.3)
Chloride: 109 mmol/L (ref 98–111)
Creatinine, Ser: 0.76 mg/dL (ref 0.61–1.24)
GFR calc Af Amer: >60 mL/min (ref >60)
GFR calc non Af Amer: >60 mL/min (ref >60)
Glucose, Bld: 108 mg/dL — ABNORMAL HIGH (ref 70–99)
Potassium: 4 mmol/L (ref 3.5–5.1)
Sodium: 141 mmol/L (ref 135–145)
Total Bilirubin: 1.9 mg/dL — ABNORMAL HIGH (ref 0.3–1.2)
Total Protein: 5.3 g/dL — ABNORMAL LOW (ref 6.5–8.1)

## 2019-04-20 LAB — BPAM PLATELET PHERESIS
Blood Product Expiration Date: 202012252359
Blood Product Expiration Date: 202012252359
ISSUE DATE / TIME: 202012220903
ISSUE DATE / TIME: 202012221256
Unit Type and Rh: 9500
Unit Type and Rh: 5100

## 2019-04-20 LAB — CBC
HCT: 29.9 % — ABNORMAL LOW (ref 39.0–52.0)
Hemoglobin: 10.7 g/dL — ABNORMAL LOW (ref 13.0–17.0)
MCH: 33.5 pg (ref 26.0–34.0)
MCHC: 35.8 g/dL (ref 30.0–36.0)
MCV: 93.7 fL (ref 80.0–100.0)
Platelets: 74 10*3/uL — ABNORMAL LOW (ref 150–400)
RBC: 3.19 MIL/uL — ABNORMAL LOW (ref 4.22–5.81)
RDW: 13 % (ref 11.5–15.5)
WBC: 6.5 10*3/uL (ref 4.0–10.5)
nRBC: 0 % (ref 0.0–0.2)

## 2019-04-20 LAB — PREPARE PLATELET PHERESIS
Unit division: 0
Unit division: 0

## 2019-04-20 LAB — PROTIME-INR
INR: 1.5 — ABNORMAL HIGH (ref 0.8–1.2)
Prothrombin Time: 18.2 seconds — ABNORMAL HIGH (ref 11.4–15.2)

## 2019-04-20 MED ORDER — METHOCARBAMOL 500 MG PO TABS: 500.0000 mg | ORAL_TABLET | Freq: Four times a day (QID) | ORAL | 0 refills | Status: DC | PRN

## 2019-04-20 MED ORDER — IBUPROFEN 800 MG PO TABS: 800.0000 mg | ORAL_TABLET | Freq: Three times a day (TID) | ORAL | 0 refills | Status: DC | PRN

## 2019-04-20 MED ORDER — INFLUENZA VAC SPLIT QUAD 0.5 ML IM SUSY: 0.5000 mL | PREFILLED_SYRINGE | INTRAMUSCULAR | Status: DC

## 2019-04-20 MED ORDER — OXYCODONE HCL 5 MG PO TABS: 5.0000 mg | ORAL_TABLET | Freq: Four times a day (QID) | ORAL | 0 refills | Status: DC | PRN

## 2019-04-20 MED ORDER — PNEUMOCOCCAL VAC POLYVALENT 25 MCG/0.5ML IJ INJ: 0.5000 mL | INJECTION | INTRAMUSCULAR | Status: DC

## 2019-04-20 NOTE — Progress Notes (Deleted)
[place] Hospital Day(s): 1.   Post op day(s): 1 Day Post-Op.   Interval History:  Patient seen and examined no acute events or new complaints overnight.  Patient reports he is feeling good this morning, incisional soreness No fever, chills, nausea, or emesis Hemoglobin 10 this morning He has tolerated a CLD without issues.  JP drain with 270 ccs out.  Mobilizing well No other issues  Vital signs in last 24 hours: [min-max] current  Temp:  [96.9 F (36.1 C)-99.4 F (37.4 C)] 98.3 F (36.8 C) (12/23 0500) Pulse Rate:  [57-107] 70 (12/23 0500) Resp:  [10-20] 20 (12/23 0500) BP: (95-129)/(59-94) 95/59 (12/23 0500) SpO2:  [92 %-100 %] 95 % (12/23 0500)     Height: 5' 5"  (165.1 cm)       Intake/Output last 2 shifts:  12/22 0701 - 12/23 0700 In: 3745.9 [P.O.:240; I.V.:2475.9; Blood:280; IV Piggyback:750] Out: 6945 [Urine:900; Drains:275; Blood:600]   Physical Exam:  Constitutional: alert, cooperative and no distress  Respiratory: breathing non-labored at rest  Cardiovascular: regular rate and sinus rhythm  Gastrointestinal: soft, incisional soreness, and non-distended. No rebound/guarding. Blake drain in RUQ with serosanguinous output Integumentary: RUQ cholecystectomy incision and laparoscopic incisions are CDI with staples, no erythema, minimal drainage.   Labs:  CBC Latest Ref Rng & Units 04/20/2019 04/19/2019 04/19/2019  WBC 4.0 - 10.5 K/uL 6.5 9.1 -  Hemoglobin 13.0 - 17.0 g/dL 10.7(L) 12.1(L) -  Hematocrit 39.0 - 52.0 % 29.9(L) 35.3(L) -  Platelets 150 - 400 K/uL 74(L) 88(L) 68(L)   CMP Latest Ref Rng & Units 04/20/2019 04/19/2019 03/08/2019  Glucose 70 - 99 mg/dL 108(H) 140(H) 97  BUN 6 - 20 mg/dL 14 15 12   Creatinine 0.61 - 1.24 mg/dL 0.76 0.82 0.80  Sodium 135 - 145 mmol/L 141 138 143  Potassium 3.5 - 5.1 mmol/L 4.0 4.4 4.6  Chloride 98 - 111 mmol/L 109 109 108(H)  CO2 22 - 32 mmol/L 25 18(L) 22  Calcium 8.9 - 10.3  mg/dL 8.2(L) 8.5(L) 9.4  Total Protein 6.5 - 8.1 g/dL 5.3(L) 6.0(L) 6.5  Total Bilirubin 0.3 - 1.2 mg/dL 1.9(H) 1.7(H) 1.4(H)  Alkaline Phos 38 - 126 U/L 45 57 91  AST 15 - 41 U/L 51(H) 76(H) 25  ALT 0 - 44 U/L 43 57(H) 11    Imaging studies: No new pertinent imaging studies   Assessment/Plan:  58 y.o. male overall doing well 1 Day Post-Op s/p attempted robotic assisted laparoscopic cholecystectomy converted to open subtotal cholecystectomy for gallstone pancreatitis, complicated by pertinent comorbidities including history of cirrhosis.   - Advance to soft diet; discontinue IVF  - Pain control prn  - monitor abdominal examination; trend Hgb   - Continue blake drain; record output   - mobilization encouraged   - medical management of comorbid conditions   - DVT Prophylaxis: hold     - Discharge Planning: If continues to progress well then hopefully home in next 24 hours; will need drain teaching  All of the above findings and recommendations were discussed with the patient, and the medical team, and all of patient's questions were answered to his expressed satisfaction.  -- Edison Simon, PA-C  [facility] 04/20/2019, 7:35 AM [phone] M-F: 7am - 4pm

## 2019-04-20 NOTE — Discharge Summary (Signed)
[facility] SURGICAL DISCHARGE SUMMARY   Patient ID: Ryan Willis. MRN: [id number] DOB/AGE: 59-Aug-1962 59 y.o.  Admit date: 04/19/2019 Discharge date: 04/20/2019  Discharge Diagnoses Patient Active Problem List   Diagnosis Date Noted  . Cholecystitis 04/19/2019  . Acute cholecystitis 01/25/2019  . Splenomegaly 07/07/2017  . Thrombocytopenia ([place]) 06/23/2017  . Cirrhosis ([place]) 06/23/2017    Consultants None  Procedures 04/19/2019:  Attempted robotic assisted laparoscopic cholecystectomy converted to open subtotal cholecystectomy  HPI: Ryan Willis is a 58 year old male with prior history of cholecystitis requiring cholecystostomy tube in the setting of cirrhosis. I have repeated his laboratory evaluation showing a consider improvement on his bilirubin and liver numbers. The only abnormality is a total bilirubin of 1.4. INR creatinine and albumin are completely normal. He does have significant thrombocytopenia. Currently showing no signs of active bleeding or easy bruising. He does have a cholecystostomy tube and I obtain a cholangiogram that I have personally reviewed showing evidence of patent cystic duct. There was some dislodgment of the cholecystostomy tube is still within the biliary tree. He clinically is doing well without any fevers chills or abdominal pain. He is taking p.o. well he is having normal bowel movements. He has been walking and currently is able to perform more than 4 METS of activity without any shortness of breath or chest pain. Now w minimal output from drain  Hospital Course: Informed consent was obtained and documented, and patient underwent uneventful attempted robotic assisted laparoscopic cholecystectomy converted to open subtotal cholecystectomy (Dr Dahlia Byes, 04/19/2019).  Post-operatively, patient's pain and symptoms improved/resolved, his hemoglobin remained stable, and advancement of patient's diet and ambulation were well-tolerated.  The remainder of patient's hospital course was essentially unremarkable, and discharge planning was initiated accordingly with patient safely able to be discharged home with appropriate discharge instructions, pain control, and outpatient follow-up after all of his questions were answered to his expressed satisfaction.   Discharge Condition: Good   Physical Examination:  Constitutional: alert, cooperative and no distress  Respiratory: breathing non-labored at rest  Cardiovascular: regular rate and sinus rhythm  Gastrointestinal: soft, incisional soreness, and non-distended. No rebound/guarding. Blake drain in RUQ with serosanguinous output Integumentary: RUQ cholecystectomy incision and laparoscopic incisions are CDI with staples, no erythema, minimal drainage.     Allergies as of 04/20/2019   No Known Allergies     Medication List    TAKE these medications   ibuprofen 800 MG tablet Commonly known as: ADVIL Take 1 tablet (800 mg total) by mouth every 8 (eight) hours as needed.   methocarbamol 500 MG tablet Commonly known as: ROBAXIN Take 1 tablet (500 mg total) by mouth every 6 (six) hours as needed for muscle spasms.   oxyCODONE 5 MG immediate release tablet Commonly known as: Oxy IR/ROXICODONE Take 1 tablet (5 mg total) by mouth every 6 (six) hours as needed for severe pain or breakthrough pain.        Follow-up Information    Pabon, [place] F, MD. Schedule an appointment as soon as possible for a visit in 1 week(s).   Specialty: General Surgery Why: Follow up early next week for post-op follow up and drain removal - s/p open cholecystectomy with Pabon (WILL LIKELY NEED TO SEE DR PABON'S PARTNERS) Contact information: [address] [place] [address] [phone]            Time spent on discharge management including discussion of hospital course, clinical condition, outpatient instructions, prescriptions, and follow up with the patient  and  members of the medical team: >30 minutes  -- Edison Simon , PA-C [facility]  04/20/2019, 1:57 PM [phone] M-F: 7am - 4pm

## 2019-04-20 NOTE — Progress Notes (Signed)
Ryan Willis.  A and O x 4. VSS. Pt tolerating diet well. No complaints of pain or nausea. IV removed intact, prescriptions given. Pt voiced understanding of discharge instructions with no further questions. Pt discharged via wheelchair with NT.     Allergies as of 04/20/2019   No Known Allergies     Medication List    TAKE these medications   ibuprofen 800 MG tablet Commonly known as: ADVIL Take 1 tablet (800 mg total) by mouth every 8 (eight) hours as needed.   methocarbamol 500 MG tablet Commonly known as: ROBAXIN Take 1 tablet (500 mg total) by mouth every 6 (six) hours as needed for muscle spasms.   oxyCODONE 5 MG immediate release tablet Commonly known as: Oxy IR/ROXICODONE Take 1 tablet (5 mg total) by mouth every 6 (six) hours as needed for severe pain or breakthrough pain.       Vitals:   04/20/19 0500 04/20/19 1332  BP: (!) 95/59 116/70  Pulse: 70 95  Resp: 20   Temp: 98.3 F (36.8 C) 98.3 F (36.8 C)  SpO2: 95% 94%    Francesco Sor

## 2019-04-21 LAB — SURGICAL PATHOLOGY

## 2019-04-26 ENCOUNTER — Ambulatory Visit (INDEPENDENT_AMBULATORY_CARE_PROVIDER_SITE_OTHER): Payer: Self-pay | Admitting: General Surgery

## 2019-04-26 ENCOUNTER — Other Ambulatory Visit: Payer: Self-pay

## 2019-04-26 ENCOUNTER — Encounter: Payer: Self-pay | Admitting: General Surgery

## 2019-04-26 VITALS — BP 153/89 | HR 89 | Temp 97.7°F | Ht 65.0 in | Wt 177.2 lb

## 2019-04-26 DIAGNOSIS — Z9049 Acquired absence of other specified parts of digestive tract: Secondary | ICD-10-CM

## 2019-04-26 NOTE — Progress Notes (Signed)
Ryan Willis is a 58 year old patient of Dr. Corlis Leak.  He has a past medical history significant for cirrhosis.  He underwent a robotic, converted to open cholecystectomy on April 19, 2019.  He did remarkably well postoperatively and was discharged home on the 23rd.  He did have a drain in place.  He is here today for a postoperative visit.  He states that he has been doing very well since his operation.  He is no longer requiring any pain medications.  He is eating well and in fact, tolerated [place] food yesterday.  He states that he has had some mild shortness of breath while lying flat up until last night.  This seems to have resolved.  His drain is putting out approximately 170 cc of serosanguineous fluid daily.  Today's Vitals   04/26/19 1010  BP: (!) 153/89  Pulse: 89  Temp: 97.7 F (36.5 C)  SpO2: 92%  Weight: 177 lb 3.2 oz (80.4 kg)  Height: 5' 5"  (1.651 m)   Body mass index is 29.49 kg/m. Focused abdominal exam: The remaining port sites have staples in place.  There is no erythema, induration, or purulent drainage identified.  The subcostal incision is also well approximated with staples.  There is some soft tissue swelling at the more cranial edge of the wound, but this is nontender and feels like there may be a small seroma in place.  The drain has serosanguineous discharge present.  Impression and plan: This is a 58 year old man now 1 week out from a robotic, converted to open cholecystectomy.  He does have a history of cirrhosis but no reported history of ascites, although the continued high drain output may reflect some mild, subclinical ascites.  We will leave it in place for now.  I removed the staples from the trocar sites, but left the subcostal incision stapled.  He will return in 1 week to have those removed.  He will continue to record his drain output and hopefully it can come out at that visit as well.

## 2019-04-26 NOTE — Patient Instructions (Addendum)
Follow up here in one week for drain and staple removal. Call us with any questions.    GENERAL POST-OPERATIVE PATIENT INSTRUCTIONS   WOUND CARE INSTRUCTIONS:  Keep a dry clean dressing on the wound if there is drainage. The initial bandage may be removed after 24 hours.  Once the wound has quit draining you may leave it open to air.  If clothing rubs against the wound or causes irritation and the wound is not draining you may cover it with a dry dressing during the daytime.  Try to keep the wound dry and avoid ointments on the wound unless directed to do so.  If the wound becomes bright red and painful or starts to drain infected material that is not clear, please contact your physician immediately.  If the wound is mildly pink and has a thick firm ridge underneath it, this is normal, and is referred to as a healing ridge.  This will resolve over the next 4-6 weeks.  BATHING: You may shower if you have been informed of this by your surgeon. However, Please do not submerge in a tub, hot tub, or pool until incisions are completely sealed or have been told by your surgeon that you may do so.  ACTIVITY:   You may want to hug a pillow when coughing and sneezing to add additional support to the surgical area, if you had abdominal or chest surgery, which will decrease pain during these times.  You are encouraged to walk and engage in light activity for the next two weeks.  You should not lift more than 20 pounds, for 6 weeks after surgery as it could put you at increased risk for complications.  Twenty pounds is roughly equivalent to a plastic bag of groceries. At that time- Listen to your body when lifting, if you have pain when lifting, stop and then try again in a few days. Soreness after doing exercises or activities of daily living is normal as you get back in to your normal routine.  MEDICATIONS:  Try to take narcotic medications and anti-inflammatory medications, such as tylenol, ibuprofen, naprosyn,  etc., with food.  This will minimize stomach upset from the medication.  Should you develop nausea and vomiting from the pain medication, or develop a rash, please discontinue the medication and contact your physician.  You should not drive, make important decisions, or operate machinery when taking narcotic pain medication.  SUNBLOCK Use sun block to incision area over the next year if this area will be exposed to sun. This helps decrease scarring and will allow you avoid a permanent darkened area over your incision.  QUESTIONS:  Please feel free to call our office if you have any questions, and we will be glad to assist you.

## 2019-05-02 ENCOUNTER — Encounter: Payer: Self-pay | Admitting: Surgery

## 2019-05-02 ENCOUNTER — Other Ambulatory Visit: Payer: Self-pay

## 2019-05-02 ENCOUNTER — Ambulatory Visit (INDEPENDENT_AMBULATORY_CARE_PROVIDER_SITE_OTHER): Payer: Self-pay | Admitting: Surgery

## 2019-05-02 VITALS — BP 106/69 | HR 73 | Temp 97.3°F | Ht 66.0 in | Wt 178.4 lb

## 2019-05-02 DIAGNOSIS — Z09 Encounter for follow-up examination after completed treatment for conditions other than malignant neoplasm: Secondary | ICD-10-CM

## 2019-05-02 MED ORDER — FUROSEMIDE 40 MG PO TABS: 40.0000 mg | ORAL_TABLET | Freq: Every day | ORAL | 0 refills | Status: DC

## 2019-05-02 NOTE — Progress Notes (Signed)
S/p open cholecystectomy Doing well Drain about 50-70cc serous Some leg edema taknig PO no fevers  PE NAD Abd: soft, staples and drain removed ( serous). Leg pitting edema  A./P Doing well 1 week lasix RTC prn

## 2019-05-02 NOTE — Patient Instructions (Signed)
Patient had drain and staples removed today at his visit.   Follow-up with our office as needed.  Please call and ask to speak with a nurse if you develop questions or concerns.

## 2019-05-11 DIAGNOSIS — H5213 Myopia, bilateral: Secondary | ICD-10-CM | POA: Diagnosis not present

## 2019-06-08 NOTE — Progress Notes (Signed)
Patient comes in today for pre physical labs and EKG. Patient is scheduled with Laurey Morale, PA-C for a physical on 06/16/19  6-8 wks S/P Cholesystectomy  Kidney stone issues - multiple - ongoing - passed some & still has some  Liver issues - cirrhosis ? R/T fatty liver  AMD.

## 2019-06-09 ENCOUNTER — Other Ambulatory Visit: Payer: Self-pay

## 2019-06-09 ENCOUNTER — Ambulatory Visit: Payer: 59

## 2019-06-09 DIAGNOSIS — Z0000 Encounter for general adult medical examination without abnormal findings: Secondary | ICD-10-CM

## 2019-06-09 LAB — POCT URINALYSIS DIPSTICK
Bilirubin, UA: NEGATIVE
Blood, UA: POSITIVE
Glucose, UA: NEGATIVE
Ketones, UA: NEGATIVE
Leukocytes, UA: NEGATIVE
Nitrite, UA: NEGATIVE
Protein, UA: NEGATIVE
Spec Grav, UA: 1.025 (ref 1.010–1.025)
Urobilinogen, UA: 0.2 E.U./dL
pH, UA: 6 (ref 5.0–8.0)

## 2019-06-10 LAB — CMP12+LP+TP+TSH+6AC+PSA+CBC…
ALT: 19 IU/L (ref 0–44)
AST: 31 IU/L (ref 0–40)
Albumin/Globulin Ratio: 1.6 (ref 1.2–2.2)
Albumin: 3.9 g/dL (ref 3.8–4.9)
Alkaline Phosphatase: 98 IU/L (ref 39–117)
BUN/Creatinine Ratio: 14 (ref 9–20)
BUN: 12 mg/dL (ref 6–24)
Basophils Absolute: 0 10*3/uL (ref 0.0–0.2)
Basos: 1 %
Bilirubin Total: 1.3 mg/dL — ABNORMAL HIGH (ref 0.0–1.2)
Calcium: 9.3 mg/dL (ref 8.7–10.2)
Chloride: 108 mmol/L — ABNORMAL HIGH (ref 96–106)
Chol/HDL Ratio: 2.4 ratio (ref 0.0–5.0)
Cholesterol, Total: 120 mg/dL (ref 100–199)
Creatinine, Ser: 0.86 mg/dL (ref 0.76–1.27)
EOS (ABSOLUTE): 0 10*3/uL (ref 0.0–0.4)
Eos: 2 %
Estimated CHD Risk: <0.5 times avg. (ref 0.0–1.0)
Free Thyroxine Index: 2.1 (ref 1.2–4.9)
GFR calc Af Amer: 110 mL/min/{1.73_m2} (ref >59)
GFR calc non Af Amer: 95 mL/min/{1.73_m2} (ref >59)
GGT: 40 IU/L (ref 0–65)
Globulin, Total: 2.5 g/dL (ref 1.5–4.5)
Glucose: 88 mg/dL (ref 65–99)
HDL: 51 mg/dL (ref >39)
Hematocrit: 38.2 % (ref 37.5–51.0)
Hemoglobin: 12.9 g/dL — ABNORMAL LOW (ref 13.0–17.7)
Immature Grans (Abs): 0 10*3/uL (ref 0.0–0.1)
Immature Granulocytes: 0 %
Iron: 102 ug/dL (ref 38–169)
LDH: 122 IU/L (ref 121–224)
LDL Chol Calc (NIH): 54 mg/dL (ref 0–99)
Lymphocytes Absolute: 0.5 10*3/uL — ABNORMAL LOW (ref 0.7–3.1)
Lymphs: 28 %
MCH: 32.3 pg (ref 26.6–33.0)
MCHC: 33.8 g/dL (ref 31.5–35.7)
MCV: 96 fL (ref 79–97)
Monocytes Absolute: 0.2 10*3/uL (ref 0.1–0.9)
Monocytes: 9 %
Neutrophils Absolute: 1.1 10*3/uL — ABNORMAL LOW (ref 1.4–7.0)
Neutrophils: 60 %
Phosphorus: 3.1 mg/dL (ref 2.8–4.1)
Platelets: 72 10*3/uL — CL (ref 150–450)
Potassium: 4.3 mmol/L (ref 3.5–5.2)
Prostate Specific Ag, Serum: 1.5 ng/mL (ref 0.0–4.0)
RBC: 3.99 x10E6/uL — ABNORMAL LOW (ref 4.14–5.80)
RDW: 13 % (ref 11.6–15.4)
Sodium: 142 mmol/L (ref 134–144)
T3 Uptake Ratio: 25 % (ref 24–39)
T4, Total: 8.3 ug/dL (ref 4.5–12.0)
TSH: 3.03 u[IU]/mL (ref 0.450–4.500)
Total Protein: 6.4 g/dL (ref 6.0–8.5)
Triglycerides: 70 mg/dL (ref 0–149)
Uric Acid: 4.8 mg/dL (ref 3.8–8.4)
VLDL Cholesterol Cal: 15 mg/dL (ref 5–40)
WBC: 1.9 10*3/uL — CL (ref 3.4–10.8)

## 2019-06-27 ENCOUNTER — Other Ambulatory Visit: Payer: Self-pay

## 2019-06-27 ENCOUNTER — Telehealth: Payer: Self-pay

## 2019-06-27 ENCOUNTER — Ambulatory Visit: Payer: Self-pay | Admitting: Physician Assistant

## 2019-06-27 ENCOUNTER — Encounter: Payer: Self-pay | Admitting: Physician Assistant

## 2019-06-27 VITALS — BP 122/72 | HR 74 | Temp 98.5°F | Resp 12 | Ht 66.0 in | Wt 177.0 lb

## 2019-06-27 DIAGNOSIS — K7460 Unspecified cirrhosis of liver: Secondary | ICD-10-CM

## 2019-06-27 DIAGNOSIS — Z0000 Encounter for general adult medical examination without abnormal findings: Secondary | ICD-10-CM

## 2019-06-27 DIAGNOSIS — Z9049 Acquired absence of other specified parts of digestive tract: Secondary | ICD-10-CM

## 2019-06-27 NOTE — Telephone Encounter (Signed)
-----   Message from Rushie Chestnut, [place] sent at 03/16/2019  8:52 AM EST ----- Regarding: Pt due for RUQ U/S Pt is due for RUQ U/S to screen for liver HCC this month

## 2019-06-27 NOTE — Progress Notes (Signed)
Subjective:    Patient ID: Ryan Bottom., male    DOB: 06-05-1960, 59 y.o.   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 Contiues to have multiple bilaterally by his report. Currently none causing discpomfot Opted to have stone removed and then proceed with GB  Back to back procedures were well tolerated Steadily getting stronger and more active  Elective weight loss  Loves to ride motorcycles and goal is to be back on board by summer Does long distance biking when at full strength   thrombocytopenia- followed by Dr Pollyann Savoy at [facility]- follow up planned 09/15/19  Cirrhosis - Dr Vicente Males GI- U/S liver pending 07/01/19  Urology -stone f/u  02/27/20 [place]  Kid[facility]  Was briefly on lasix post op for LE edema when still chair bound--used 5-6 days and stopped  Reports stopping Robaxin after just a short  while  Review of Systems  All other systems reviewed and are negative.     Objective:   Physical Exam Vitals and nursing note reviewed.  Constitutional:      General: He is not in acute distress.    Appearance: Normal appearance.  HENT:     Head: Normocephalic and atraumatic.     Right Ear: Tympanic membrane normal.     Left Ear: Tympanic membrane normal.     Nose: Nose normal.     Mouth/Throat:     Mouth: Mucous membranes are moist.  Eyes:     Extraocular Movements: Extraocular movements intact.  Cardiovascular:     Rate and Rhythm: Normal rate and regular rhythm.     Pulses: Normal pulses.  Pulmonary:     Effort: Pulmonary effort is normal.     Breath sounds: Normal breath sounds.  Abdominal:     General: Abdomen is flat.     Palpations: Abdomen is soft.     Comments: Post op   Musculoskeletal:        General: Normal range of motion.     Cervical back: Normal range of motion.     Comments: Ambulatory and on off table without limitations- denies untoward abdominal discomfort  Skin:    General: Skin is warm and dry.     Capillary Refill: Capillary refill takes less than 2 seconds.  Neurological:     Mental Status: He is alert.  Psychiatric:        Mood and Affect: Mood normal.           Assessment & Plan:

## 2019-06-27 NOTE — Telephone Encounter (Signed)
Spoke with pt and reminded him that he is due for an ultrasound of the liver to screening for [place]. Pt agrees and we have scheduled the U/S appointment.

## 2019-06-27 NOTE — Progress Notes (Signed)
Presents for annual physical.  Cholesystecomy 03/2020 - experiences occ fatigue & abd sensitivity.  Steady increase by to normal - not 100%  AMD

## 2019-07-01 ENCOUNTER — Ambulatory Visit
Admission: RE | Admit: 2019-07-01 | Discharge: 2019-07-01 | Disposition: A | Discharge status: Home / Self Care | Payer: 59 | Source: Ambulatory Visit | Attending: Gastroenterology | Admitting: Gastroenterology

## 2019-07-01 ENCOUNTER — Other Ambulatory Visit: Payer: Self-pay

## 2019-07-01 DIAGNOSIS — K7460 Unspecified cirrhosis of liver: Secondary | ICD-10-CM | POA: Diagnosis not present

## 2019-07-14 ENCOUNTER — Encounter: Payer: Self-pay | Admitting: Gastroenterology

## 2019-09-12 DIAGNOSIS — D696 Thrombocytopenia, unspecified: Secondary | ICD-10-CM | POA: Diagnosis not present

## 2019-09-13 ENCOUNTER — Encounter: Payer: Self-pay | Admitting: Internal Medicine

## 2019-09-14 ENCOUNTER — Other Ambulatory Visit: Payer: Self-pay

## 2019-09-14 NOTE — Progress Notes (Signed)
Patient inquiring if he can have his covid vaccine given his low plt count.

## 2019-09-15 ENCOUNTER — Inpatient Hospital Stay: Payer: 59 | Attending: Internal Medicine | Admitting: Internal Medicine

## 2019-09-15 DIAGNOSIS — D696 Thrombocytopenia, unspecified: Secondary | ICD-10-CM | POA: Diagnosis not present

## 2019-09-15 DIAGNOSIS — K7460 Unspecified cirrhosis of liver: Secondary | ICD-10-CM | POA: Diagnosis not present

## 2019-09-15 DIAGNOSIS — E039 Hypothyroidism, unspecified: Secondary | ICD-10-CM | POA: Diagnosis not present

## 2019-09-15 NOTE — Assessment & Plan Note (Addendum)
#   Thrombocytopenia/splenomegaly-Hypersplenism- [January 2019 platelets- between 106-97] May 2021- platelets 63. Trending down but overall stable.  Asymptomatic.  # Cirrhosis/splenomegaly-MRI in May 2020/US-March 2021-negative for [place]; ultrasound/GI-AFP 9.1 [n-8.3]; monitror for now.   # covid vaccnie-discussed the pros and cons of vaccination; I think benefits of vaccination overweighs the risk.  Patient agrees with vaccination.   # DISPOSITION:  # follow up in 6 months; MD, labs -cbc/cmp/afp/Pt/PTT [[facility]]; - Dr.B

## 2019-09-15 NOTE — Progress Notes (Signed)
[facility] CONSULT NOTE  Patient Care Team: Patient, No Pcp Per as PCP - General (General Practice)  CHIEF COMPLAINTS/PURPOSE OF CONSULTATION: Thrombocytopenia  # THROMBOCYTOPENIA [97-108; incidental]; Hep B/hepC/HIV-NEG [[facility]] sec to cirrhosis/ splenomegaly  # Cirrhosis/ SPLENOMEGALY[US- 1000cc; CT A/P march 2019] s/p EGD/colo [may 2019- Dr.Anna]  #October 2020-kidney stones s/p stenting [Dr. Erlene Quan; gallstone/cholecystitis-s/p cholecystectomy December 2020]  # Peyronie disease-   Oncology History   No history exists.   HISTORY OF PRESENTING ILLNESS:  Ryan Willis. 59 y.o.  male cirrhosis/splenomegaly and thrombocytopenia is here for follow-up.  In the interim patient underwent cholecystectomy.  Postoperatively no complications.  Denies any easy bruising or bleeding.  Denies any nausea vomiting abdominal pain swelling the legs.   Review of Systems  Constitutional: Positive for weight loss. Negative for chills, diaphoresis, fever and malaise/fatigue.  HENT: Negative for nosebleeds and sore throat.   Eyes: Negative for double vision.  Respiratory: Negative for cough, hemoptysis, sputum production, shortness of breath and wheezing.   Cardiovascular: Negative for chest pain, palpitations, orthopnea and leg swelling.  Gastrointestinal: Negative for abdominal pain, blood in stool, constipation, diarrhea, heartburn, melena, nausea and vomiting.  Genitourinary: Negative for dysuria, frequency and urgency.  Musculoskeletal: Negative for back pain and joint pain.  Skin: Negative.  Negative for itching and rash.  Neurological: Negative for dizziness, tingling, focal weakness, weakness and headaches.  Endo/Heme/Allergies: Bruises/bleeds easily.  Psychiatric/Behavioral: Negative for depression. The patient is not nervous/anxious and does not have insomnia.     MEDICAL HISTORY:  Past Medical History:  Diagnosis Date  . Cirrhosis of liver ([place])   . Diverticulitis    . Family history of adverse reaction to anesthesia    MOTHER HAD N/V  . History of kidney stones   . Hypercholesteremia   . Hypothyroidism   . Peyronie's disease   . Renal cyst, left   . Seasonal allergies   . Thrombocytopenia ([place])     SURGICAL HISTORY: Past Surgical History:  Procedure Laterality Date  . COLONOSCOPY  2006  . COLONOSCOPY WITH PROPOFOL N/A 09/08/2017   Procedure: COLONOSCOPY WITH PROPOFOL;  Surgeon: [name], MD;  Location: [facility] ENDOSCOPY;  Service: Gastroenterology;  Laterality: N/A;  . COLONOSCOPY WITH PROPOFOL N/A 01/01/2018   Procedure: COLONOSCOPY WITH PROPOFOL;  Surgeon: [name], MD;  Location: [facility] ENDOSCOPY;  Service: Gastroenterology;  Laterality: N/A;  . CYSTOSCOPY/URETEROSCOPY/HOLMIUM LASER/STENT PLACEMENT Right 01/17/2019   Procedure: CYSTOSCOPY/URETEROSCOPY/STENT PLACEMENT;  Surgeon: Hollice Espy, MD;  Location: [facility] ORS;  Service: Urology;  Laterality: Right;  . CYSTOSCOPY/URETEROSCOPY/HOLMIUM LASER/STENT PLACEMENT Right 01/31/2019   Procedure: CYSTOSCOPY/URETEROSCOPY/HOLMIUM LASER/STENT EXCHANGE;  Surgeon: Hollice Espy, MD;  Location: [facility] ORS;  Service: Urology;  Laterality: Right;  . ESOPHAGOGASTRODUODENOSCOPY (EGD) WITH PROPOFOL N/A 09/08/2017   Procedure: ESOPHAGOGASTRODUODENOSCOPY (EGD) WITH PROPOFOL;  Surgeon: [name], MD;  Location: [facility] ENDOSCOPY;  Service: Gastroenterology;  Laterality: N/A;  . HERNIA REPAIR  1981  . INTRAOPERATIVE CHOLANGIOGRAM N/A 04/19/2019   Procedure: INTRAOPERATIVE CHOLANGIOGRAM;  Surgeon: Jules Husbands, MD;  Location: [facility] ORS;  Service: General;  Laterality: N/A;  . SHOULDER SURGERY Right 2001   ROTATOR CUFF REPAIR    SOCIAL HISTORY:  Social History   Socioeconomic History  . Marital status: Married    Spouse name: Not on file  . Number of children: Not on file  . Years of education: Not on file  . Highest education level: Not on file  Occupational History  . Not on file  Tobacco Use  . Smoking  status: Never  Smoker  . Smokeless tobacco: Never Used  Substance and Sexual Activity  . Alcohol use: Not Currently    Comment: "1 or 2 year drinks a year."  . Drug use: No  . Sexual activity: Not on file  Other Topics Concern  . Not on file  Social History Narrative   retd. Engineer, structural; part time information specialist; never smoked; no alcohol; live in [place]. Lives with wife.    Social Determinants of Health   Financial Resource Strain:   . Difficulty of Paying Living Expenses:   Food Insecurity:   . Worried About Charity fundraiser in the Last Year:   . Arboriculturist in the Last Year:   Transportation Needs:   . Film/video editor (Medical):   Marland Kitchen Lack of Transportation (Non-Medical):   Physical Activity:   . Days of Exercise per Week:   . Minutes of Exercise per Session:   Stress:   . Feeling of Stress :   Social Connections:   . Frequency of Communication with Friends and Family:   . Frequency of Social Gatherings with Friends and Family:   . Attends Religious Services:   . Active Member of Clubs or Organizations:   . Attends Archivist Meetings:   Marland Kitchen Marital Status:   Intimate Partner Violence:   . Fear of Current or Ex-Partner:   . Emotionally Abused:   Marland Kitchen Physically Abused:   . Sexually Abused:     FAMILY HISTORY: colon father- 43 year; mother- kidney cancer; died of lung cancer [2018] Family History  Problem Relation Age of Onset  . Kidney cancer Mother   . Colon cancer Father     ALLERGIES:  has No Known Allergies.  MEDICATIONS:  No current outpatient medications on file.   No current facility-administered medications for this visit.   PHYSICAL EXAMINATION: ECOG PERFORMANCE STATUS: 0 - Asymptomatic  Vitals:   09/15/19 1006  BP: 132/79  Pulse: 67  Resp: 20  Temp: (!) 96.8 F (36 C)   Filed Weights   09/15/19 1006  Weight: 179 lb 3.2 oz (81.3 kg)   Physical Exam  Constitutional: He is oriented to person, place, and time  and well-developed, well-nourished, and in no distress.  HENT:  Head: Normocephalic and atraumatic.  Mouth/Throat: Oropharynx is clear and moist. No oropharyngeal exudate.  Eyes: Pupils are equal, round, and reactive to light.  Cardiovascular: Normal rate and regular rhythm.  Pulmonary/Chest: No respiratory distress. He has no wheezes.  Abdominal: Soft. Bowel sounds are normal. He exhibits no distension and no mass. There is no abdominal tenderness. There is no rebound and no guarding.  Musculoskeletal:        General: No tenderness or edema. Normal range of motion.     Cervical back: Normal range of motion and neck supple.  Neurological: He is alert and oriented to person, place, and time.  Skin: Skin is warm.  Psychiatric: Affect normal.     LABORATORY DATA:  I have reviewed the data as listed Lab Results  Component Value Date   WBC 1.9 (LL) 06/09/2019   HGB 12.9 (L) 06/09/2019   HCT 38.2 06/09/2019   MCV 96 06/09/2019   PLT 72 (LL) 06/09/2019   Recent Labs    01/25/19 1143 01/26/19 0509 01/28/19 0329 03/08/19 0836 04/19/19 1818 04/20/19 0452 06/09/19 0832  NA  --    < >   < > 143 138 141 142  K  --    < >   < >  4.6 4.4 4.0 4.3  CL  --    < >   < > 108* 109 109 108*  CO2  --    < >  --  22 18* 25  --   GLUCOSE  --    < >   < > 97 140* 108* 88  BUN  --    < >   < > 12 15 14 12   CREATININE  --    < >   < > 0.80 0.82 0.76 0.86  CALCIUM  --    < >   < > 9.4 8.5* 8.2* 9.3  GFRNONAA  --    < >   < > 98 >60 >60 95  GFRAA  --    < >   < > 113 >60 >60 110  PROT 7.1   < >   < > 6.5 6.0* 5.3* 6.4  ALBUMIN 3.8   < >   < > 4.3 3.7 3.2* 3.9  AST 30   < >   < > 25 76* 51* 31  ALT 25   < >   < > 11 57* 43 19  ALKPHOS 73   < >   < > 91 57 45 98  BILITOT 2.9*   < >   < > 1.4* 1.7* 1.9* 1.3*  BILIDIR 0.5*  --   --   --   --   --   --   IBILI 2.4*  --   --   --   --   --   --    < > = values in this interval not displayed.    RADIOGRAPHIC STUDIES: I have personally reviewed  the radiological images as listed and agreed with the findings in the report. No results found.  ASSESSMENT & PLAN:   Thrombocytopenia [facility]) # Thrombocytopenia/splenomegaly-Hypersplenism- [HLKTGYB 2019 platelets- between 106-97] May 2021- platelets 63. Trending down but overall stable.  Asymptomatic.  # Cirrhosis/splenomegaly-MRI in May 2020/US-March 2021-negative for [place]; ultrasound/GI-AFP 9.1 [n-8.3]; monitror for now.   # covid vaccnie-discussed the pros and cons of vaccination; I think benefits of vaccination overweighs the risk.  Patient agrees with vaccination.   # DISPOSITION:  # follow up in 6 months; MD, labs -cbc/cmp/afp/Pt/PTT [[facility]]; - Dr.B   All questions were answered. The patient knows to call the clinic with any problems, questions or concerns.    Cammie Sickle, MD 09/15/2019 2:06 PM

## 2019-09-28 ENCOUNTER — Other Ambulatory Visit: Payer: Self-pay

## 2019-09-28 ENCOUNTER — Ambulatory Visit (INDEPENDENT_AMBULATORY_CARE_PROVIDER_SITE_OTHER): Payer: 59 | Admitting: Gastroenterology

## 2019-09-28 VITALS — BP 124/79 | HR 60 | Temp 97.5°F | Ht 66.0 in | Wt 179.2 lb

## 2019-09-28 DIAGNOSIS — R635 Abnormal weight gain: Secondary | ICD-10-CM | POA: Diagnosis not present

## 2019-09-28 DIAGNOSIS — R772 Abnormality of alphafetoprotein: Secondary | ICD-10-CM

## 2019-09-28 DIAGNOSIS — K7460 Unspecified cirrhosis of liver: Secondary | ICD-10-CM | POA: Diagnosis not present

## 2019-09-28 NOTE — Progress Notes (Signed)
°  °  [name] MD, MRCP(U.K) [address]  [place]  [place], [place] [address]  Main: [phone]  Fax: [phone]   Primary Care Physician: Patient, No Pcp Per  Primary Gastroenterologist:  Dr. [name]    Follow-up cirrhosis of the liver  HPI: Ryan Willis. is a 59 y.o. male    Summary of history :  H/o  liver cirrhosis secondary to NAFLD and last seen at the office in November 2020.  CT scan on 07/15/17 which showed features of cirrhosis and portal hypertension withwith splenomegaly, a recanalized left paraumbilical vein and small distal esophageal varices.His dad had colon cancer that went into the liver. 01/01/18 : colonoscopy- 3 mm tubular adenoma resected  09/08/17- EGD: No varicesantral gastropathy on bx.  Labs 09/14/17 : Ceruloplasmin low 15.2. Normal 24 hour urinary copperHe did have eye exam and was told is normal.  08/24/17 : Hep B surface ab- positive, ANA ,SM ab,LKM, immunoglubulins ,celiac serology,A1AT,HIV,Hep C ab,Hep B cv ab , Hep B e ab,HbsAg, , - Normal/Negative. Hb 15 Plt 95, ferritin 188. Immune to Hep A.INR 1.2 ,Cr 0.86   Interval history11/18/2020-09/28/2019  Underwent cholecystectomy December 2021 07/01/2019: Right upper quadrant ultrasound cirrhosis with no hepatic masses 09/12/2019: Hemoglobin 13 g, MCV 95, platelets 63, creatinine 0.89, albumin 3.9, INR 1.2, total bilirubin 1.1 AFP 9.1  Meld score of 9 Doing well, sleeps well, normal bowel movements.  No confusion. Has gained 4 pounds of weight. He has obtained both shots of his Covid vaccine   No current outpatient medications on file.   No current facility-administered medications for this visit.    Allergies as of 09/28/2019   (No Known Allergies)    ROS:  General: Negative for anorexia, weight loss, fever, chills, fatigue, weakness. ENT: Negative for hoarseness, difficulty swallowing , nasal congestion. CV: Negative for chest pain, angina, palpitations, dyspnea on  exertion, peripheral edema.  Respiratory: Negative for dyspnea at rest, dyspnea on exertion, cough, sputum, wheezing.  GI: See history of present illness. GU:  Negative for dysuria, hematuria, urinary incontinence, urinary frequency, nocturnal urination.  Endo: Negative for unusual weight change.    Physical Examination:   BP 124/79    Pulse 60    Temp (!) 97.5 F (36.4 C)   General: Well-nourished, well-developed in no acute distress.  Eyes: No icterus. Conjunctivae pink. Mouth: Oropharyngeal mucosa moist and pink , no lesions erythema or exudate. Lungs: Clear to auscultation bilaterally. Non-labored. Heart: Regular rate and rhythm, no murmurs rubs or gallops.  Abdomen: Bowel sounds are normal, nontender, nondistended, no hepatosplenomegaly or masses, no abdominal bruits or hernia , no rebound or guarding.   Extremities: No lower extremity edema. No clubbing or deformities. Neuro: Alert and oriented x 3.  Grossly intact. Skin: Warm and dry, no jaundice.   Psych: Alert and cooperative, normal mood and affect.   Imaging Studies: No results found.  Assessment and Plan:   [name]. is a 59 y.o. y/o male  here to follow up forfor liver cirrhosiswithportal hypertension likely secondary to NAFLD: Compensated.Immune to Hep A/BDoing well and losing weight. No hepatic encephelopathy. Doing well.    Meld score of 9    Plan  1.suggested to exercise change diet and lose weight  2.EGD to screen for esophagealvarices in 08/2020  3. Since AFP is elevated suggest MRI of the liver liver mass protocol    Dr [name]  MD,MRCP [facility]) Follow up in 6 months

## 2019-09-30 ENCOUNTER — Telehealth: Payer: Self-pay | Admitting: Gastroenterology

## 2019-09-30 NOTE — Telephone Encounter (Signed)
Patient LVM wanting to discuss a Hernia issue with Dr. Vicente Males..[phone].Marland Kitchen Message was sent to provider.Marland Kitchen

## 2019-10-03 ENCOUNTER — Telehealth: Payer: Self-pay | Admitting: Gastroenterology

## 2019-10-05 ENCOUNTER — Ambulatory Visit (INDEPENDENT_AMBULATORY_CARE_PROVIDER_SITE_OTHER): Payer: 59 | Admitting: Gastroenterology

## 2019-10-05 ENCOUNTER — Other Ambulatory Visit: Payer: Self-pay

## 2019-10-05 VITALS — BP 126/81 | HR 73 | Temp 97.9°F | Ht 66.0 in | Wt 177.0 lb

## 2019-10-05 DIAGNOSIS — K429 Umbilical hernia without obstruction or gangrene: Secondary | ICD-10-CM

## 2019-10-05 NOTE — Progress Notes (Signed)
Ryan Bellows MD, MRCP(U.K) [address]  [place]  [place], [place] [address]  Main: [phone]  Fax: [phone]   Primary Care Physician: Patient, No Pcp Per  Primary Gastroenterologist:  Dr. Jonathon Willis   Discuss about hernia  HPI: Ryan Willis. is a 59 y.o. male   Summary of history :  H/o  liver cirrhosis secondary to NAFLD  CT scan on 07/15/17 which showed features of cirrhosis and portal hypertension withwith splenomegaly, a recanalized left paraumbilical vein and small distal esophageal varices.His dad had colon cancer.. 01/01/18 : colonoscopy- 3 mm tubular adenoma resected  09/08/17- EGD: No varicesantral gastropathy on bx.  Labs 09/14/17 : Ceruloplasmin low 15.2. Normal 24 hour urinary copperHe did have eye exam and was told is normal.  08/24/17 : Hep B surface ab- positive, ANA ,SM ab,LKM, immunoglubulins ,celiac serology,A1AT,HIV,Hep C ab,Hep B cv ab , Hep B e ab,HbsAg, , - Normal/Negative. Hb 15 Plt 95, ferritin 188. Immune to Hep A.INR 1.2 ,Cr 0.86 Underwent cholecystectomy December 2021 07/01/2019: Right upper quadrant ultrasound cirrhosis with no hepatic masses 09/12/2019: Hemoglobin 13 g, MCV 95, platelets 63, creatinine 0.89, albumin 3.9, INR 1.2, total bilirubin 1.1 AFP 9.1  Interval history6/05/2019-10/05/2019  He called our office and said that his hernia had returned and wanted evaluation.  Unclear what hernia he was talking about and hence asked him to come into the office to be examined.  He says that he was doing well in a few weeks back he developed a protrusion around his umbilicus.  It pops up and goes back to normal when he pushes it back in.  It pops out causes him to have pain.  He has been wearing a abdominal binder which helps to keep the hernia inside and has been doing well.    No current outpatient medications on file.   No current facility-administered medications for this visit.    Allergies as of 10/05/2019  . (No Known  Allergies)    ROS:  General: Negative for anorexia, weight loss, fever, chills, fatigue, weakness. ENT: Negative for hoarseness, difficulty swallowing , nasal congestion. CV: Negative for chest pain, angina, palpitations, dyspnea on exertion, peripheral edema.  Respiratory: Negative for dyspnea at rest, dyspnea on exertion, cough, sputum, wheezing.  GI: See history of present illness. GU:  Negative for dysuria, hematuria, urinary incontinence, urinary frequency, nocturnal urination.  Endo: Negative for unusual weight change.    Physical Examination:   There were no vitals taken for this visit.  General: Well-nourished, well-developed in no acute distress.  Eyes: No icterus. Conjunctivae pink. Abdomen: Right upper quadrant and umbilical scar seen from prior cholecystectomy.  When he coughs there is a protrusion in the umbilical area which causes him to have some pain.  Easily reducible.  No guarding no rigidity no organomegaly.   Neuro: Alert and oriented x 3.  Grossly intact. Skin: Warm and dry, no jaundice.   Psych: Alert and cooperative, normal mood and affect.   Imaging Studies: No results found.  Assessment and Plan:   Ryan Vick. is a 59 y.o. y/o male who usually follows me for cirrhosis of the liver came in to see me today for a possible hernia.  On examination he appears to have a reducible umbilical hernia.  Likely at the site of the prior scar tissue for the laparoscopic cholecystectomy where he had a port inserted.  He does have pain when it pops up and is presently wearing an abdominal binder which helps  to keep the hernia reduced.  I suggested he to get evaluated by Dr. Dahlia Byes to have it evaluated and treated.  I will have my office set up an appointment with him.    Dr Ryan Bellows  MD,MRCP [facility]) Follow up in as previously scheduled for his liver issues

## 2019-10-05 NOTE — Patient Instructions (Addendum)
We will send a message to Dr. Dahlia Byes to see you soon.

## 2019-10-10 ENCOUNTER — Other Ambulatory Visit: Payer: Self-pay

## 2019-10-10 ENCOUNTER — Ambulatory Visit (INDEPENDENT_AMBULATORY_CARE_PROVIDER_SITE_OTHER): Payer: 59 | Admitting: Surgery

## 2019-10-10 ENCOUNTER — Encounter: Payer: Self-pay | Admitting: Surgery

## 2019-10-10 ENCOUNTER — Other Ambulatory Visit
Admission: RE | Admit: 2019-10-10 | Discharge: 2019-10-10 | Disposition: A | Discharge status: Home / Self Care | Payer: 59 | Source: Ambulatory Visit | Attending: Surgery | Admitting: Surgery

## 2019-10-10 VITALS — BP 136/88 | HR 74 | Temp 98.4°F | Ht 65.0 in | Wt 177.0 lb

## 2019-10-10 DIAGNOSIS — R1030 Lower abdominal pain, unspecified: Secondary | ICD-10-CM | POA: Diagnosis not present

## 2019-10-10 DIAGNOSIS — K432 Incisional hernia without obstruction or gangrene: Secondary | ICD-10-CM

## 2019-10-10 LAB — CBC WITH DIFFERENTIAL/PLATELET
Abs Immature Granulocytes: 0.01 10*3/uL (ref 0.00–0.07)
Basophils Absolute: 0.1 10*3/uL (ref 0.0–0.1)
Basophils Relative: 1 %
Eosinophils Absolute: 0.1 10*3/uL (ref 0.0–0.5)
Eosinophils Relative: 2 %
HCT: 41 % (ref 39.0–52.0)
Hemoglobin: 14.2 g/dL (ref 13.0–17.0)
Immature Granulocytes: 0 %
Lymphocytes Relative: 22 %
Lymphs Abs: 0.8 10*3/uL (ref 0.7–4.0)
MCH: 32.9 pg (ref 26.0–34.0)
MCHC: 34.6 g/dL (ref 30.0–36.0)
MCV: 95.1 fL (ref 80.0–100.0)
Monocytes Absolute: 0.3 10*3/uL (ref 0.1–1.0)
Monocytes Relative: 7 %
Neutro Abs: 2.4 10*3/uL (ref 1.7–7.7)
Neutrophils Relative %: 68 %
Platelets: 73 10*3/uL — ABNORMAL LOW (ref 150–400)
RBC: 4.31 MIL/uL (ref 4.22–5.81)
RDW: 13.8 % (ref 11.5–15.5)
WBC: 3.5 10*3/uL — ABNORMAL LOW (ref 4.0–10.5)
nRBC: 0 % (ref 0.0–0.2)

## 2019-10-10 LAB — COMPREHENSIVE METABOLIC PANEL
ALT: 24 U/L (ref 0–44)
AST: 33 U/L (ref 15–41)
Albumin: 4.3 g/dL (ref 3.5–5.0)
Alkaline Phosphatase: 82 U/L (ref 38–126)
Anion gap: 9 (ref 5–15)
BUN: 11 mg/dL (ref 6–20)
CO2: 26 mmol/L (ref 22–32)
Calcium: 9.6 mg/dL (ref 8.9–10.3)
Chloride: 106 mmol/L (ref 98–111)
Creatinine, Ser: 0.94 mg/dL (ref 0.61–1.24)
GFR calc Af Amer: >60 mL/min (ref >60)
GFR calc non Af Amer: >60 mL/min (ref >60)
Glucose, Bld: 95 mg/dL (ref 70–99)
Potassium: 4.7 mmol/L (ref 3.5–5.1)
Sodium: 141 mmol/L (ref 135–145)
Total Bilirubin: 1.9 mg/dL — ABNORMAL HIGH (ref 0.3–1.2)
Total Protein: 7.5 g/dL (ref 6.5–8.1)

## 2019-10-10 LAB — PROTIME-INR
INR: 1.2 (ref 0.8–1.2)
Prothrombin Time: 15 seconds (ref 11.4–15.2)

## 2019-10-10 LAB — APTT: aPTT: 32 seconds (ref 24–36)

## 2019-10-10 NOTE — Patient Instructions (Addendum)
Patient has been scheduled for a CT abdomen/pelvis with contrast at:  [facility] on Friday June 18th at 3:30pm with arrival time 3:15pm.   Prep: NPO 4 hours prior and pick up prep kit. Patient verbalizes understanding.  Patient will have lab work done at [facility].   Hernia, Adult     A hernia is the bulging of an organ or tissue through a weak spot in the muscles of the abdomen (abdominal wall). Hernias develop most often near the belly button (navel) or the area where the leg meets the lower abdomen (groin). Common types of hernias include:  Incisional hernia. This type bulges through a scar from an abdominal surgery.  Umbilical hernia. This type develops near the navel.  Inguinal hernia. This type develops in the groin or scrotum.  Femoral hernia. This type develops under the groin, in the upper thigh area.  Hiatal hernia. This type occurs when part of the stomach slides above the muscle that separates the abdomen from the chest (diaphragm). What are the causes? This condition may be caused by:  Heavy lifting.  Coughing over a long period of time.  Straining to have a bowel movement. Constipation can lead to straining.  An incision made during an abdominal surgery.  A physical problem that is present at birth (congenital defect).  Being overweight or obese.  Smoking.  Excess fluid in the abdomen.  Undescended testicles in males. What are the signs or symptoms? The main symptom is a skin-colored, rounded bulge in the area of the hernia. However, a bulge may not always be present. It may grow bigger or be more visible when you cough or strain (such as when lifting something heavy). A hernia that can be pushed back into the area (is reducible) rarely causes pain. A hernia that cannot be pushed back into the area (is incarcerated) may lose its blood supply (become strangulated). A hernia that is incarcerated may cause:  Pain.  Fever.  Nausea and  vomiting.  Swelling.  Constipation. How is this diagnosed? A hernia may be diagnosed based on:  Your symptoms and medical history.  A physical exam. Your health care provider may ask you to cough or move in certain ways to see if the hernia becomes visible.  Imaging tests, such as: ? X-rays. ? Ultrasound. ? CT scan. How is this treated? A hernia that is small and painless may not need to be treated. A hernia that is large or painful may be treated with surgery. Inguinal hernias may be treated with surgery to prevent incarceration or strangulation. Strangulated hernias are always treated with surgery because a lack of blood supply to the trapped organ or tissue can cause it to die. Surgery to treat a hernia involves pushing the bulge back into place and repairing the weak area of the muscle or abdominal wall. Follow these instructions at home: Activity  Avoid straining.  Do not lift anything that is heavier than 10 lb (4.5 kg), or the limit that you are told, until your health care provider says that it is safe.  When lifting heavy objects, lift with your leg muscles, not your back muscles. Preventing constipation  Take actions to prevent constipation. Constipation leads to straining with bowel movements, which can make a hernia worse or cause a hernia repair to break down. Your health care provider may recommend that you: ? Drink enough fluid to keep your urine pale yellow. ? Eat foods that are high in fiber, such as fresh  fruits and vegetables, whole grains, and beans. ? Limit foods that are high in fat and processed sugars, such as fried or sweet foods. ? Take an over-the-counter or prescription medicine for constipation. General instructions  When coughing, try to cough gently.  You may try to push the hernia back in place by very gently pressing on it while lying down. Do not try to force the bulge back in if it will not push in easily.  If you are overweight, work with  your health care provider to lose weight safely.  Do not use any products that contain nicotine or tobacco, such as cigarettes and e-cigarettes. If you need help quitting, ask your health care provider.  If you are scheduled for hernia repair, watch your hernia for any changes in shape, size, or color. Tell your health care provider about any changes or new symptoms.  Take over-the-counter and prescription medicines only as told by your health care provider.  Keep all follow-up visits as told by your health care provider. This is important. Contact a health care provider if:  You develop new pain, swelling, or redness around your hernia.  You have signs of constipation, such as: ? Fewer bowel movements in a week than normal. ? Difficulty having a bowel movement. ? Stools that are dry, hard, or larger than normal. Get help right away if:  You have a fever.  You have abdomen pain that gets worse.  You feel nauseous or you vomit.  You cannot push the hernia back in place by very gently pressing on it while lying down. Do not try to force the bulge back in if it will not push in easily.  The hernia: ? Changes in shape, size, or color. ? Feels hard or tender. These symptoms may represent a serious problem that is an emergency. Do not wait to see if the symptoms will go away. Get medical help right away. Call your local emergency services (911 in the [place] Summary  A hernia is the bulging of an organ or tissue through a weak spot in the muscles of the abdomen (abdominal wall).  The main symptom is a skin-colored, rounded lump (bulge) in the hernia area. However, a bulge may not always be present. It may grow bigger or more visible when you cough or strain (such as when having a bowel movement).  A hernia that is small and painless may not need to be treated. A hernia that is large or painful may be treated with surgery.  Surgery to treat a hernia involves pushing the bulge back into  place and repairing the weak part of the abdomen. This information is not intended to replace advice given to you by your health care provider. Make sure you discuss any questions you have with your health care provider. Document Revised: 08/05/2018 Document Reviewed: 01/14/2017 Elsevier Patient Education  [place].

## 2019-10-11 ENCOUNTER — Other Ambulatory Visit: Payer: Self-pay

## 2019-10-11 ENCOUNTER — Ambulatory Visit
Admission: RE | Admit: 2019-10-11 | Discharge: 2019-10-11 | Disposition: A | Discharge status: Home / Self Care | Payer: 59 | Source: Ambulatory Visit | Attending: Gastroenterology | Admitting: Gastroenterology

## 2019-10-11 DIAGNOSIS — K7460 Unspecified cirrhosis of liver: Secondary | ICD-10-CM | POA: Diagnosis not present

## 2019-10-11 DIAGNOSIS — K769 Liver disease, unspecified: Secondary | ICD-10-CM | POA: Diagnosis not present

## 2019-10-11 DIAGNOSIS — R772 Abnormality of alphafetoprotein: Secondary | ICD-10-CM | POA: Diagnosis not present

## 2019-10-11 MED ORDER — GADOBUTROL 1 MMOL/ML IV SOLN
7.5000 mL | Freq: Once | INTRAVENOUS | Status: AC | PRN
  Administered 2019-10-11: 7.5 mL via INTRAVENOUS

## 2019-10-12 ENCOUNTER — Other Ambulatory Visit: Payer: Self-pay

## 2019-10-12 NOTE — Progress Notes (Signed)
Outpatient Surgical Follow Up  10/12/2019  Hal Norrington. is an 59 y.o. male.   Chief Complaint  Patient presents with  . New Patient (Initial Visit)    hernia    HPI: 59 yo male well known to me w hx of cirrhosis s/p openm cholecystectomy. Does feel a bulge periumbilical area. He does have hx portal HTN and splenomegaly. He is clinically doing very well, able to perform more than 4 METS w/o SOb pr c/p/, I have personally reviewed the [place] showing cirrhosis w/o ascites, he does have levated AFP and is having an MRI performed. INR 1.5  Hb 10.7 PL 74, creat nml TB 1.9 from 5 months ago  Past Medical History:  Diagnosis Date  . Cirrhosis of liver ([place])   . Diverticulitis   . Family history of adverse reaction to anesthesia    MOTHER HAD N/V  . History of kidney stones   . Hypercholesteremia   . Hypothyroidism   . Peyronie's disease   . Renal cyst, left   . Seasonal allergies   . Thrombocytopenia ([place])     Past Surgical History:  Procedure Laterality Date  . COLONOSCOPY  2006  . COLONOSCOPY WITH PROPOFOL N/A 09/08/2017   Procedure: COLONOSCOPY WITH PROPOFOL;  Surgeon: [name], MD;  Location: [facility] ENDOSCOPY;  Service: Gastroenterology;  Laterality: N/A;  . COLONOSCOPY WITH PROPOFOL N/A 01/01/2018   Procedure: COLONOSCOPY WITH PROPOFOL;  Surgeon: [name], MD;  Location: [facility] ENDOSCOPY;  Service: Gastroenterology;  Laterality: N/A;  . CYSTOSCOPY/URETEROSCOPY/HOLMIUM LASER/STENT PLACEMENT Right 01/17/2019   Procedure: CYSTOSCOPY/URETEROSCOPY/STENT PLACEMENT;  Surgeon: Hollice Espy, MD;  Location: [facility] ORS;  Service: Urology;  Laterality: Right;  . CYSTOSCOPY/URETEROSCOPY/HOLMIUM LASER/STENT PLACEMENT Right 01/31/2019   Procedure: CYSTOSCOPY/URETEROSCOPY/HOLMIUM LASER/STENT EXCHANGE;  Surgeon: Hollice Espy, MD;  Location: [facility] ORS;  Service: Urology;  Laterality: Right;  . ESOPHAGOGASTRODUODENOSCOPY (EGD) WITH PROPOFOL N/A 09/08/2017   Procedure: ESOPHAGOGASTRODUODENOSCOPY (EGD)  WITH PROPOFOL;  Surgeon: [name], MD;  Location: [facility] ENDOSCOPY;  Service: Gastroenterology;  Laterality: N/A;  . HERNIA REPAIR  1981  . INTRAOPERATIVE CHOLANGIOGRAM N/A 04/19/2019   Procedure: INTRAOPERATIVE CHOLANGIOGRAM;  Surgeon: Jules Husbands, MD;  Location: [facility] ORS;  Service: General;  Laterality: N/A;  . SHOULDER SURGERY Right 2001   ROTATOR CUFF REPAIR    Family History  Problem Relation Age of Onset  . Kidney cancer Mother   . Lung cancer Mother   . Colon cancer Father     Social History:  reports that he has never smoked. He has never used smokeless tobacco. He reports previous alcohol use. He reports that he does not use drugs.  Allergies: No Known Allergies  Medications reviewed.    ROS Full ROS performed and is otherwise negative other than what is stated in HPI   BP 136/88   Pulse 74   Temp 98.4 F (36.9 C) (Temporal)   Ht 5' 5"  (1.651 m)   Wt 177 lb (80.3 kg)   SpO2 96%   BMI 29.45 kg/m   Physical Exam Vitals and nursing note reviewed. Exam conducted with a chaperone present.  Constitutional:      General: He is not in acute distress.    Appearance: Normal appearance. He is normal weight.  Pulmonary:     Effort: Pulmonary effort is normal. No respiratory distress.     Breath sounds: Normal breath sounds. No stridor. No rhonchi.  Abdominal:     General: There is no distension.     Palpations: Abdomen is soft. There is  no mass.     Tenderness: There is no guarding or rebound.     Hernia: A hernia is present.     Comments: Reducible periumbilcal ventral hernia  Musculoskeletal:     Cervical back: Normal range of motion and neck supple. No rigidity or tenderness.  Skin:    General: Skin is warm and dry.     Capillary Refill: Capillary refill takes less than 2 seconds.     Coloration: Skin is not jaundiced.  Neurological:     Mental Status: He is alert.  Psychiatric:        Mood and Affect: Mood normal.        Behavior: Behavior normal.         Thought Content: Thought content normal.        Judgment: Judgment normal.     Assessment/Plan:  59 yo male w hx of cirrhosis and currently Child B, Splenomegaly and portal HTN. We will obtain recent CMP, INR and CT a/p to evaluate his abdominal wall anatomy. He will need to be optimized before we can fix the hernia. CLinincally he looks very compensated and is very functional.  No need for urgent intervention at this time. Greater than 50% of the 45 minutes  visit was spent in counseling/coordination of care   Caroleen Hamman, MD Echo Surgeon

## 2019-10-13 ENCOUNTER — Ambulatory Visit: Payer: 59

## 2019-10-14 ENCOUNTER — Ambulatory Visit
Admission: RE | Admit: 2019-10-14 | Discharge: 2019-10-14 | Disposition: A | Discharge status: Home / Self Care | Payer: 59 | Source: Ambulatory Visit | Attending: Surgery | Admitting: Surgery

## 2019-10-14 ENCOUNTER — Other Ambulatory Visit: Payer: Self-pay

## 2019-10-14 DIAGNOSIS — R1030 Lower abdominal pain, unspecified: Secondary | ICD-10-CM | POA: Diagnosis not present

## 2019-10-14 DIAGNOSIS — R109 Unspecified abdominal pain: Secondary | ICD-10-CM | POA: Diagnosis not present

## 2019-10-14 MED ORDER — IOHEXOL 300 MG/ML  SOLN
100.0000 mL | Freq: Once | INTRAMUSCULAR | Status: AC | PRN
  Administered 2019-10-14: 100 mL via INTRAVENOUS

## 2019-10-17 ENCOUNTER — Telehealth: Payer: Self-pay

## 2019-10-17 NOTE — Telephone Encounter (Signed)
Left message for patient to give our office a call back for recent imaging results.

## 2019-10-17 NOTE — Telephone Encounter (Signed)
-----   Message from Jules Husbands, MD sent at 10/17/2019  9:08 AM EDT ----- Please let him know he does have a small hernia. We will talk some more in the office ----- Message ----- From: Interface, Rad Results In Sent: 10/15/2019   9:05 PM EDT To: Jules Husbands, MD

## 2019-10-17 NOTE — Telephone Encounter (Signed)
Patient notified and made aware of recent imaging results. Patient verbalizes understanding and had no further questions. Patient has scheduled appointment for Monday 10/24/19 with Dr.Pabon.

## 2019-10-24 ENCOUNTER — Other Ambulatory Visit: Payer: Self-pay

## 2019-10-24 ENCOUNTER — Telehealth: Payer: Self-pay | Admitting: Surgery

## 2019-10-24 ENCOUNTER — Ambulatory Visit (INDEPENDENT_AMBULATORY_CARE_PROVIDER_SITE_OTHER): Payer: 59 | Admitting: Surgery

## 2019-10-24 ENCOUNTER — Encounter: Payer: Self-pay | Admitting: Surgery

## 2019-10-24 VITALS — BP 123/74 | HR 61 | Temp 97.9°F | Ht 66.0 in | Wt 177.4 lb

## 2019-10-24 DIAGNOSIS — K439 Ventral hernia without obstruction or gangrene: Secondary | ICD-10-CM | POA: Diagnosis not present

## 2019-10-24 NOTE — Progress Notes (Signed)
Outpatient Surgical Follow Up  10/24/2019  Ryan Willis. is an 59 y.o. male.   Chief Complaint  Patient presents with  . Follow-up     Follow up - discuss CT results 10/13/19 and lab work - Dr Dahlia Byes    HPI: 59 yo male well known to me w hx of cirrhosis s/p open cholecystectomy 03/2019. Does feel a bulge periumbilical area. He does have hx portal HTN and splenomegaly. He is clinically doing very well, able to perform more than 4 METS w/o SOb pr c/p/, I have personally reviewed the U.S showing cirrhosis w/o ascites, also had an MRI that I have personally reviewed showing no evidence of liver masses or hepatocellular carcinoma.  He had a CT scan performed that I have personally reviewed showing evidence of umbilical ventral hernia there is evidence of a splenomegaly and cirrhosis.  There is no evidence of ascites.  Latest CMP was completely normal except a total bilirubin of 1.9.  NR was normal.  CBC was normal except platelets of 73,000. He feels well but reports increasing pain around the ventral hernia.  Past Medical History:  Diagnosis Date  . Cirrhosis of liver ([place])   . Diverticulitis   . Family history of adverse reaction to anesthesia    MOTHER HAD N/V  . History of kidney stones   . Hypercholesteremia   . Hypothyroidism   . Peyronie's disease   . Renal cyst, left   . Seasonal allergies   . Thrombocytopenia ([place])     Past Surgical History:  Procedure Laterality Date  . COLONOSCOPY  2006  . COLONOSCOPY WITH PROPOFOL N/A 09/08/2017   Procedure: COLONOSCOPY WITH PROPOFOL;  Surgeon: [name], MD;  Location: [facility] ENDOSCOPY;  Service: Gastroenterology;  Laterality: N/A;  . COLONOSCOPY WITH PROPOFOL N/A 01/01/2018   Procedure: COLONOSCOPY WITH PROPOFOL;  Surgeon: [name], MD;  Location: [facility] ENDOSCOPY;  Service: Gastroenterology;  Laterality: N/A;  . CYSTOSCOPY/URETEROSCOPY/HOLMIUM LASER/STENT PLACEMENT Right 01/17/2019   Procedure: CYSTOSCOPY/URETEROSCOPY/STENT PLACEMENT;   Surgeon: Hollice Espy, MD;  Location: [facility] ORS;  Service: Urology;  Laterality: Right;  . CYSTOSCOPY/URETEROSCOPY/HOLMIUM LASER/STENT PLACEMENT Right 01/31/2019   Procedure: CYSTOSCOPY/URETEROSCOPY/HOLMIUM LASER/STENT EXCHANGE;  Surgeon: Hollice Espy, MD;  Location: [facility] ORS;  Service: Urology;  Laterality: Right;  . ESOPHAGOGASTRODUODENOSCOPY (EGD) WITH PROPOFOL N/A 09/08/2017   Procedure: ESOPHAGOGASTRODUODENOSCOPY (EGD) WITH PROPOFOL;  Surgeon: [name], MD;  Location: [facility] ENDOSCOPY;  Service: Gastroenterology;  Laterality: N/A;  . HERNIA REPAIR  1981  . INTRAOPERATIVE CHOLANGIOGRAM N/A 04/19/2019   Procedure: INTRAOPERATIVE CHOLANGIOGRAM;  Surgeon: Jules Husbands, MD;  Location: [facility] ORS;  Service: General;  Laterality: N/A;  . SHOULDER SURGERY Right 2001   ROTATOR CUFF REPAIR    Family History  Problem Relation Age of Onset  . Kidney cancer Mother   . Lung cancer Mother   . Colon cancer Father     Social History:  reports that he has never smoked. He has never used smokeless tobacco. He reports previous alcohol use. He reports that he does not use drugs.  Allergies: No Known Allergies  Medications reviewed.    ROS Full ROS performed and is otherwise negative other than what is stated in HPI   BP 123/74   Pulse 61   Temp 97.9 F (36.6 C) (Temporal)   Ht 5' 6"  (1.676 m)   Wt 177 lb 6.4 oz (80.5 kg)   SpO2 97%   BMI 28.63 kg/m   Physical Exam Vitals and nursing note reviewed. Exam conducted with a chaperone  present.  Constitutional:      General: He is not in acute distress.    Appearance: Normal appearance. He is normal weight.  Eyes:     General: No scleral icterus.       Right eye: No discharge.        Left eye: No discharge.  Cardiovascular:     Rate and Rhythm: Normal rate and regular rhythm.  Pulmonary:     Effort: Pulmonary effort is normal. No respiratory distress.     Breath sounds: Normal breath sounds. No stridor.  Abdominal:     General:  Abdomen is flat. There is no distension.     Palpations: Abdomen is soft. There is no mass.     Tenderness: There is no abdominal tenderness. There is no guarding or rebound.     Hernia: A hernia is present.     Comments: Reducible 3 centimeter ventral hernia above the umbilicus.  Reducible and mildly tender.  There is no evidence of ascites  Musculoskeletal:        General: Normal range of motion.     Cervical back: Normal range of motion and neck supple. No rigidity or tenderness.  Skin:    General: Skin is warm and dry.     Capillary Refill: Capillary refill takes less than 2 seconds.  Neurological:     General: No focal deficit present.     Mental Status: He is alert and oriented to person, place, and time.  Psychiatric:        Mood and Affect: Mood normal.        Behavior: Behavior normal.        Thought Content: Thought content normal.        Judgment: Judgment normal.    Assessment/Plan: 59 year old male with cirrhosis child's A's correlate with splenomegaly and some degree of portal hypertension now presents with symptomatic ventral hernia.  Clinically he is optimized he does not have any ascites on his numbers look as good as they are going to get.  He does have thrombocytopenia and I will anticipate to give him 1 unit of platelets preoperatively.  Procedure discussed with patient in detail.  Risks, benefits and possible applications.  He does have a significant enlarged spleen and given the bleeding issues I do think that the safest procedure would be to do it open.  Discussed with him at length and he understands this.  Procedure discussed with the patient in detail.  Risks, benefits and possible applications including but not limited to: Bleeding, infection, recurrence, bowel injuries and chronic pain.  He understands and wished to proceed.   Greater than 50% of the 40 minutes  visit was spent in counseling/coordination of care   Caroleen Hamman, MD [place] Surgeon

## 2019-10-24 NOTE — Telephone Encounter (Signed)
Pt has been advised of Pre-Admission date/time, COVID Testing date and Surgery date.  Surgery Date: 10/27/19 Preadmission Testing Date: 10/26/19 (phone 8a-1p) Covid Testing Date: 10/26/19 - patient advised to go to the [place] [facility] between 8a-1p   Patient has been made aware to call [phone], between 1-3:00pm the day before surgery, to find out what time to arrive for surgery.

## 2019-10-24 NOTE — H&P (View-Only) (Signed)
Outpatient Surgical Follow Up  10/24/2019  Ryan Willis. is an 59 y.o. male.   Chief Complaint  Patient presents with  . Follow-up     Follow up - discuss CT results 10/13/19 and lab work - Dr Dahlia Byes    HPI: 59 yo male well known to me w hx of cirrhosis s/p open cholecystectomy 03/2019. Does feel a bulge periumbilical area. He does have hx portal HTN and splenomegaly. He is clinically doing very well, able to perform more than 4 METS w/o SOb pr c/p/, I have personally reviewed the U.S showing cirrhosis w/o ascites, also had an MRI that I have personally reviewed showing no evidence of liver masses or hepatocellular carcinoma.  He had a CT scan performed that I have personally reviewed showing evidence of umbilical ventral hernia there is evidence of a splenomegaly and cirrhosis.  There is no evidence of ascites.  Latest CMP was completely normal except a total bilirubin of 1.9.  NR was normal.  CBC was normal except platelets of 73,000. He feels well but reports increasing pain around the ventral hernia.  Past Medical History:  Diagnosis Date  . Cirrhosis of liver ([place])   . Diverticulitis   . Family history of adverse reaction to anesthesia    MOTHER HAD N/V  . History of kidney stones   . Hypercholesteremia   . Hypothyroidism   . Peyronie's disease   . Renal cyst, left   . Seasonal allergies   . Thrombocytopenia ([place])     Past Surgical History:  Procedure Laterality Date  . COLONOSCOPY  2006  . COLONOSCOPY WITH PROPOFOL N/A 09/08/2017   Procedure: COLONOSCOPY WITH PROPOFOL;  Surgeon: [name], MD;  Location: [facility] ENDOSCOPY;  Service: Gastroenterology;  Laterality: N/A;  . COLONOSCOPY WITH PROPOFOL N/A 01/01/2018   Procedure: COLONOSCOPY WITH PROPOFOL;  Surgeon: [name], MD;  Location: [facility] ENDOSCOPY;  Service: Gastroenterology;  Laterality: N/A;  . CYSTOSCOPY/URETEROSCOPY/HOLMIUM LASER/STENT PLACEMENT Right 01/17/2019   Procedure: CYSTOSCOPY/URETEROSCOPY/STENT PLACEMENT;   Surgeon: Hollice Espy, MD;  Location: [facility] ORS;  Service: Urology;  Laterality: Right;  . CYSTOSCOPY/URETEROSCOPY/HOLMIUM LASER/STENT PLACEMENT Right 01/31/2019   Procedure: CYSTOSCOPY/URETEROSCOPY/HOLMIUM LASER/STENT EXCHANGE;  Surgeon: Hollice Espy, MD;  Location: [facility] ORS;  Service: Urology;  Laterality: Right;  . ESOPHAGOGASTRODUODENOSCOPY (EGD) WITH PROPOFOL N/A 09/08/2017   Procedure: ESOPHAGOGASTRODUODENOSCOPY (EGD) WITH PROPOFOL;  Surgeon: [name], MD;  Location: [facility] ENDOSCOPY;  Service: Gastroenterology;  Laterality: N/A;  . HERNIA REPAIR  1981  . INTRAOPERATIVE CHOLANGIOGRAM N/A 04/19/2019   Procedure: INTRAOPERATIVE CHOLANGIOGRAM;  Surgeon: Jules Husbands, MD;  Location: [facility] ORS;  Service: General;  Laterality: N/A;  . SHOULDER SURGERY Right 2001   ROTATOR CUFF REPAIR    Family History  Problem Relation Age of Onset  . Kidney cancer Mother   . Lung cancer Mother   . Colon cancer Father     Social History:  reports that he has never smoked. He has never used smokeless tobacco. He reports previous alcohol use. He reports that he does not use drugs.  Allergies: No Known Allergies  Medications reviewed.    ROS Full ROS performed and is otherwise negative other than what is stated in HPI   BP 123/74   Pulse 61   Temp 97.9 F (36.6 C) (Temporal)   Ht 5' 6"  (1.676 m)   Wt 177 lb 6.4 oz (80.5 kg)   SpO2 97%   BMI 28.63 kg/m   Physical Exam Vitals and nursing note reviewed. Exam conducted with a chaperone  present.  Constitutional:      General: He is not in acute distress.    Appearance: Normal appearance. He is normal weight.  Eyes:     General: No scleral icterus.       Right eye: No discharge.        Left eye: No discharge.  Cardiovascular:     Rate and Rhythm: Normal rate and regular rhythm.  Pulmonary:     Effort: Pulmonary effort is normal. No respiratory distress.     Breath sounds: Normal breath sounds. No stridor.  Abdominal:     General:  Abdomen is flat. There is no distension.     Palpations: Abdomen is soft. There is no mass.     Tenderness: There is no abdominal tenderness. There is no guarding or rebound.     Hernia: A hernia is present.     Comments: Reducible 3 centimeter ventral hernia above the umbilicus.  Reducible and mildly tender.  There is no evidence of ascites  Musculoskeletal:        General: Normal range of motion.     Cervical back: Normal range of motion and neck supple. No rigidity or tenderness.  Skin:    General: Skin is warm and dry.     Capillary Refill: Capillary refill takes less than 2 seconds.  Neurological:     General: No focal deficit present.     Mental Status: He is alert and oriented to person, place, and time.  Psychiatric:        Mood and Affect: Mood normal.        Behavior: Behavior normal.        Thought Content: Thought content normal.        Judgment: Judgment normal.    Assessment/Plan: 59 year old male with cirrhosis child's A's correlate with splenomegaly and some degree of portal hypertension now presents with symptomatic ventral hernia.  Clinically he is optimized he does not have any ascites on his numbers look as good as they are going to get.  He does have thrombocytopenia and I will anticipate to give him 1 unit of platelets preoperatively.  Procedure discussed with patient in detail.  Risks, benefits and possible applications.  He does have a significant enlarged spleen and given the bleeding issues I do think that the safest procedure would be to do it open.  Discussed with him at length and he understands this.  Procedure discussed with the patient in detail.  Risks, benefits and possible applications including but not limited to: Bleeding, infection, recurrence, bowel injuries and chronic pain.  He understands and wished to proceed.   Greater than 50% of the 40 minutes  visit was spent in counseling/coordination of care   Caroleen Hamman, MD [place] Surgeon

## 2019-10-24 NOTE — Patient Instructions (Signed)
Our surgery scheduler Pamala Hurry will contact you within the next 24-48 hours for surgery. During the call, she will discuss the preparation prior to surgery and also discuss the different dates and times for surgery. Please have the BLUE sheet available when she contacts you. If you have any questions regarding the surgery, please give our office a call.  Open Hernia Repair, Adult  Open hernia repair is a surgical procedure to fix a hernia. A hernia occurs when an internal organ or tissue pushes out through a weak spot in the abdominal wall muscles. Hernias commonly occur in the groin and around the navel. Most hernias tend to get worse over time. Often, surgery is done to prevent the hernia from becoming bigger, uncomfortable, or an emergency. Emergency surgery may be needed if abdominal contents get stuck in the opening (incarcerated hernia) or the blood supply gets cut off (strangulated hernia). In an open repair, an incision is made in the abdomen to perform the surgery. Tell a health care provider about:  Any allergies you have.  All medicines you are taking, including vitamins, herbs, eye drops, creams, and over-the-counter medicines.  Any problems you or family members have had with anesthetic medicines.  Any blood or bone disorders you have.  Any surgeries you have had.  Any medical conditions you have, including any recent cold or flu symptoms.  Whether you are pregnant or may be pregnant. What are the risks? Generally, this is a safe procedure. However, problems may occur, including:  Long-lasting (chronic) pain.  Bleeding.  Infection.  Damage to the testicle. This can cause shrinking or swelling.  Damage to the bladder, blood vessels, intestine, or nerves near the hernia.  Trouble passing urine.  Allergic reactions to medicines.  Return of the hernia. What happens before the procedure? Staying hydrated Follow instructions from your health care provider about  hydration, which may include:  Up to 2 hours before the procedure - you may continue to drink clear liquids, such as water, clear fruit juice, black coffee, and plain tea. Eating and drinking restrictions Follow instructions from your health care provider about eating and drinking, which may include:  8 hours before the procedure - stop eating heavy meals or foods such as meat, fried foods, or fatty foods.  6 hours before the procedure - stop eating light meals or foods, such as toast or cereal.  6 hours before the procedure - stop drinking milk or drinks that contain milk.  2 hours before the procedure - stop drinking clear liquids. Medicines  Ask your health care provider about: ? Changing or stopping your regular medicines. This is especially important if you are taking diabetes medicines or blood thinners. ? Taking medicines such as aspirin and ibuprofen. These medicines can thin your blood. Do not take these medicines before your procedure if your health care provider instructs you not to.  You may be given antibiotic medicine to help prevent infection. General instructions  You may have blood tests or imaging studies.  Ask your health care provider how your surgical site will be marked or identified.  If you smoke, do not smoke for at least 2 weeks before your procedure or for as long as told by your health care provider.  Let your health care provider know if you develop a cold or any infection before your surgery.  Plan to have someone take you home from the hospital or clinic.  If you will be going home right after the procedure, plan to have  someone with you for 24 hours. What happens during the procedure?  To reduce your risk of infection: ? Your health care team will wash or sanitize their hands. ? Your skin will be washed with soap. ? Hair may be removed from the surgical area.  An IV tube will be inserted into one of your veins.  You will be given one or more  of the following: ? A medicine to help you relax (sedative). ? A medicine to numb the area (local anesthetic). ? A medicine to make you fall asleep (general anesthetic).  Your surgeon will make an incision over the hernia.  The tissues of the hernia will be moved back into place.  The edges of the hernia may be stitched together.  The opening in the abdominal muscles will be closed with stitches (sutures). Or, your surgeon will place a mesh patch made of manmade (synthetic) material over the opening.  The incision will be closed.  A bandage (dressing) may be placed over the incision. The procedure may vary among health care providers and hospitals. What happens after the procedure?  Your blood pressure, heart rate, breathing rate, and blood oxygen level will be monitored until the medicines you were given have worn off.  You may be given medicine for pain.  Do not drive for 24 hours if you received a sedative. This information is not intended to replace advice given to you by your health care provider. Make sure you discuss any questions you have with your health care provider. Document Revised: 03/27/2017 Document Reviewed: 09/26/2015 Elsevier Patient Education  2020 [employer].

## 2019-10-26 ENCOUNTER — Other Ambulatory Visit: Payer: Self-pay

## 2019-10-26 ENCOUNTER — Encounter
Admission: RE | Admit: 2019-10-26 | Discharge: 2019-10-26 | Disposition: A | Discharge status: Home / Self Care | Payer: 59 | Source: Ambulatory Visit | Attending: Surgery | Admitting: Surgery

## 2019-10-26 ENCOUNTER — Other Ambulatory Visit
Admission: RE | Admit: 2019-10-26 | Discharge: 2019-10-26 | Disposition: A | Discharge status: Home / Self Care | Payer: 59 | Source: Ambulatory Visit | Attending: Surgery | Admitting: Surgery

## 2019-10-26 DIAGNOSIS — Z01812 Encounter for preprocedural laboratory examination: Secondary | ICD-10-CM | POA: Diagnosis not present

## 2019-10-26 DIAGNOSIS — Z20822 Contact with and (suspected) exposure to covid-19: Secondary | ICD-10-CM | POA: Diagnosis not present

## 2019-10-26 LAB — TYPE AND SCREEN
ABO/RH(D): O POS
Antibody Screen: NEGATIVE

## 2019-10-26 LAB — PROTIME-INR
INR: 1.3 — ABNORMAL HIGH (ref 0.8–1.2)
Prothrombin Time: 15.6 seconds — ABNORMAL HIGH (ref 11.4–15.2)

## 2019-10-26 LAB — SARS CORONAVIRUS 2 (TAT 6-24 HRS): SARS Coronavirus 2: NEGATIVE

## 2019-10-26 NOTE — Patient Instructions (Signed)
Your procedure is scheduled on: 10/27/19 Report to [place]. To find out your arrival time please call [phone] between 1PM - 3PM on 10/26/19.  Remember: Instructions that are not followed completely may result in serious medical risk, up to and including death, or upon the discretion of your surgeon and anesthesiologist your surgery may need to be rescheduled.     _X__ 1. Do not eat food after midnight the night before your procedure.                 No gum chewing or hard candies. You may drink clear liquids up to 2 hours                 before you are scheduled to arrive for your surgery- DO not drink clear                 liquids within 2 hours of the start of your surgery.                 Clear Liquids include:  water, apple juice without pulp, clear carbohydrate                 drink such as Clearfast or Gatorade, Black Coffee or Tea (Do not add                 anything to coffee or tea). Diabetics water only  __X__2.  On the morning of surgery brush your teeth with toothpaste and water, you                 may rinse your mouth with mouthwash if you wish.  Do not swallow any              toothpaste of mouthwash.     _X__ 3.  No Alcohol for 24 hours before or after surgery.   _X__ 4.  Do Not Smoke or use e-cigarettes For 24 Hours Prior to Your Surgery.                 Do not use any chewable tobacco products for at least 6 hours prior to                 surgery.  ____  5.  Bring all medications with you on the day of surgery if instructed.   __X__  6.  Notify your doctor if there is any change in your medical condition      (cold, fever, infections).     Do not wear jewelry, make-up, hairpins, clips or nail polish. Do not wear lotions, powders, or perfumes.  Do not shave 48 hours prior to surgery. Men may shave face and neck. Do not bring valuables to the hospital.    [facility] is not responsible for any belongings or  valuables.  Contacts, dentures/partials or body piercings may not be worn into surgery. Bring a case for your contacts, glasses or hearing aids, a denture cup will be supplied. Leave your suitcase in the car. After surgery it may be brought to your room. For patients admitted to the hospital, discharge time is determined by your treatment team.   Patients discharged the day of surgery will not be allowed to drive home.   Please read over the following fact sheets that you were given:   MRSA Information  __X__ Take these medicines the morning of surgery with A SIP OF WATER:  1. none  2.   3.   4.  5.  6.  ____ Fleet Enema (as directed)   __X__ Use CHG Soap/SAGE wipes as directed  ____ Use inhalers on the day of surgery  ____ Stop metformin/Janumet/Farxiga 2 days prior to surgery    ____ Take 1/2 of usual insulin dose the night before surgery. No insulin the morning          of surgery.   ____ Stop Blood Thinners Coumadin/Plavix/Xarelto/Pleta/Pradaxa/Eliquis/Effient/Aspirin  on   Or contact your Surgeon, Cardiologist or Medical Doctor regarding  ability to stop your blood thinners  __X__ Stop Anti-inflammatories 7 days before surgery such as Advil, Ibuprofen, Motrin,  BC or Goodies Powder, Naprosyn, Naproxen, Aleve, Aspirin    __X__ Stop all herbal supplements, fish oil or vitamin E until after surgery.    ____ Bring C-Pap to the hospital.

## 2019-10-27 ENCOUNTER — Ambulatory Visit: Payer: 59 | Admitting: Registered Nurse

## 2019-10-27 ENCOUNTER — Encounter
Admission: RE | Disposition: A | Discharge status: Home / Self Care | Payer: Self-pay | Source: Home / Self Care | Attending: Surgery

## 2019-10-27 ENCOUNTER — Ambulatory Visit
Admission: RE | Admit: 2019-10-27 | Discharge: 2019-10-27 | Disposition: A | Discharge status: Home / Self Care | Payer: 59 | Attending: Surgery | Admitting: Surgery

## 2019-10-27 ENCOUNTER — Encounter: Payer: Self-pay | Admitting: Surgery

## 2019-10-27 DIAGNOSIS — Z8051 Family history of malignant neoplasm of kidney: Secondary | ICD-10-CM | POA: Insufficient documentation

## 2019-10-27 DIAGNOSIS — Z87442 Personal history of urinary calculi: Secondary | ICD-10-CM | POA: Diagnosis not present

## 2019-10-27 DIAGNOSIS — K7460 Unspecified cirrhosis of liver: Secondary | ICD-10-CM | POA: Insufficient documentation

## 2019-10-27 DIAGNOSIS — K766 Portal hypertension: Secondary | ICD-10-CM | POA: Diagnosis not present

## 2019-10-27 DIAGNOSIS — Z800 Family history of malignant neoplasm of digestive organs: Secondary | ICD-10-CM | POA: Diagnosis not present

## 2019-10-27 DIAGNOSIS — Z7689 Persons encountering health services in other specified circumstances: Secondary | ICD-10-CM | POA: Diagnosis not present

## 2019-10-27 DIAGNOSIS — D696 Thrombocytopenia, unspecified: Secondary | ICD-10-CM | POA: Diagnosis not present

## 2019-10-27 DIAGNOSIS — N486 Induration penis plastica: Secondary | ICD-10-CM | POA: Insufficient documentation

## 2019-10-27 DIAGNOSIS — E039 Hypothyroidism, unspecified: Secondary | ICD-10-CM | POA: Diagnosis not present

## 2019-10-27 DIAGNOSIS — Z801 Family history of malignant neoplasm of trachea, bronchus and lung: Secondary | ICD-10-CM | POA: Diagnosis not present

## 2019-10-27 DIAGNOSIS — Z9049 Acquired absence of other specified parts of digestive tract: Secondary | ICD-10-CM | POA: Insufficient documentation

## 2019-10-27 DIAGNOSIS — E7800 Pure hypercholesterolemia, unspecified: Secondary | ICD-10-CM | POA: Diagnosis not present

## 2019-10-27 DIAGNOSIS — K439 Ventral hernia without obstruction or gangrene: Secondary | ICD-10-CM

## 2019-10-27 DIAGNOSIS — K432 Incisional hernia without obstruction or gangrene: Secondary | ICD-10-CM | POA: Insufficient documentation

## 2019-10-27 HISTORY — PX: VENTRAL HERNIA REPAIR: SHX424

## 2019-10-27 LAB — PREPARE PLATELET PHERESIS
Unit division: 0
Unit division: 0

## 2019-10-27 LAB — BPAM PLATELET PHERESIS
Blood Product Expiration Date: 202107032359
Blood Product Expiration Date: 202107032359
Unit Type and Rh: 5100
Unit Type and Rh: 5100

## 2019-10-27 SURGERY — REPAIR, HERNIA, VENTRAL
Anesthesia: General

## 2019-10-27 MED ORDER — CHLORHEXIDINE GLUCONATE 0.12 % MT SOLN
OROMUCOSAL | Status: AC
  Administered 2019-10-27: 15 mL via OROMUCOSAL
  Filled 2019-10-27: qty 15

## 2019-10-27 MED ORDER — MIDAZOLAM HCL 2 MG/2ML IJ SOLN
INTRAMUSCULAR | Status: DC | PRN
  Administered 2019-10-27: 2 mg via INTRAVENOUS

## 2019-10-27 MED ORDER — GABAPENTIN 300 MG PO CAPS
ORAL_CAPSULE | ORAL | Status: AC
  Administered 2019-10-27: 300 mg via ORAL
  Filled 2019-10-27: qty 1

## 2019-10-27 MED ORDER — CHLORHEXIDINE GLUCONATE CLOTH 2 % EX PADS: 6.0000 | MEDICATED_PAD | Freq: Once | CUTANEOUS | Status: DC

## 2019-10-27 MED ORDER — OXYCODONE HCL 5 MG/5ML PO SOLN: 5.0000 mg | Freq: Once | ORAL | Status: DC | PRN

## 2019-10-27 MED ORDER — FENTANYL CITRATE (PF) 100 MCG/2ML IJ SOLN
25.0000 ug | INTRAMUSCULAR | Status: DC | PRN
  Administered 2019-10-27 (×3): 25 ug via INTRAVENOUS

## 2019-10-27 MED ORDER — CHLORHEXIDINE GLUCONATE 0.12 % MT SOLN: 15.0000 mL | Freq: Once | OROMUCOSAL | Status: AC

## 2019-10-27 MED ORDER — CEFAZOLIN SODIUM-DEXTROSE 2-4 GM/100ML-% IV SOLN
2.0000 g | INTRAVENOUS | Status: AC
  Administered 2019-10-27: 2 g via INTRAVENOUS

## 2019-10-27 MED ORDER — ONDANSETRON HCL 4 MG/2ML IJ SOLN
INTRAMUSCULAR | Status: AC
  Filled 2019-10-27: qty 2

## 2019-10-27 MED ORDER — DEXAMETHASONE SODIUM PHOSPHATE 10 MG/ML IJ SOLN
INTRAMUSCULAR | Status: AC
  Filled 2019-10-27: qty 1

## 2019-10-27 MED ORDER — FENTANYL CITRATE (PF) 100 MCG/2ML IJ SOLN
INTRAMUSCULAR | Status: AC
  Filled 2019-10-27: qty 2

## 2019-10-27 MED ORDER — LIDOCAINE HCL (PF) 2 % IJ SOLN
INTRAMUSCULAR | Status: AC
  Filled 2019-10-27: qty 5

## 2019-10-27 MED ORDER — GLYCOPYRROLATE 0.2 MG/ML IJ SOLN
INTRAMUSCULAR | Status: AC
  Filled 2019-10-27: qty 1

## 2019-10-27 MED ORDER — SUGAMMADEX SODIUM 200 MG/2ML IV SOLN
INTRAVENOUS | Status: DC | PRN
  Administered 2019-10-27: 154.2 mg via INTRAVENOUS

## 2019-10-27 MED ORDER — OXYCODONE HCL 5 MG PO TABS: 5.0000 mg | ORAL_TABLET | Freq: Once | ORAL | Status: DC | PRN

## 2019-10-27 MED ORDER — ROCURONIUM BROMIDE 10 MG/ML (PF) SYRINGE
PREFILLED_SYRINGE | INTRAVENOUS | Status: AC
  Filled 2019-10-27: qty 10

## 2019-10-27 MED ORDER — ONDANSETRON HCL 4 MG/2ML IJ SOLN
INTRAMUSCULAR | Status: DC | PRN
  Administered 2019-10-27: 4 mg via INTRAVENOUS

## 2019-10-27 MED ORDER — BUPIVACAINE LIPOSOME 1.3 % IJ SUSP
INTRAMUSCULAR | Status: DC | PRN
  Administered 2019-10-27: 20 mL

## 2019-10-27 MED ORDER — PHENYLEPHRINE HCL (PRESSORS) 10 MG/ML IV SOLN
INTRAVENOUS | Status: AC
  Filled 2019-10-27: qty 1

## 2019-10-27 MED ORDER — DEXAMETHASONE SODIUM PHOSPHATE 10 MG/ML IJ SOLN
INTRAMUSCULAR | Status: DC | PRN
  Administered 2019-10-27: 10 mg via INTRAVENOUS

## 2019-10-27 MED ORDER — ONDANSETRON HCL 4 MG/2ML IJ SOLN: 4.0000 mg | Freq: Once | INTRAMUSCULAR | Status: DC | PRN

## 2019-10-27 MED ORDER — BUPIVACAINE-EPINEPHRINE (PF) 0.25% -1:200000 IJ SOLN
INTRAMUSCULAR | Status: AC
  Filled 2019-10-27: qty 30

## 2019-10-27 MED ORDER — FENTANYL CITRATE (PF) 100 MCG/2ML IJ SOLN
INTRAMUSCULAR | Status: DC | PRN
  Administered 2019-10-27 (×2): 50 ug via INTRAVENOUS

## 2019-10-27 MED ORDER — PHENYLEPHRINE HCL (PRESSORS) 10 MG/ML IV SOLN
INTRAVENOUS | Status: DC | PRN
  Administered 2019-10-27: 100 ug via INTRAVENOUS

## 2019-10-27 MED ORDER — BUPIVACAINE HCL (PF) 0.25 % IJ SOLN
INTRAMUSCULAR | Status: AC
  Filled 2019-10-27: qty 30

## 2019-10-27 MED ORDER — SUCCINYLCHOLINE CHLORIDE 200 MG/10ML IV SOSY
PREFILLED_SYRINGE | INTRAVENOUS | Status: AC
  Filled 2019-10-27: qty 10

## 2019-10-27 MED ORDER — ROCURONIUM BROMIDE 100 MG/10ML IV SOLN
INTRAVENOUS | Status: DC | PRN
  Administered 2019-10-27: 20 mg via INTRAVENOUS
  Administered 2019-10-27: 50 mg via INTRAVENOUS

## 2019-10-27 MED ORDER — OXYCODONE HCL 5 MG PO TABS: 5.0000 mg | ORAL_TABLET | ORAL | 0 refills | Status: DC | PRN

## 2019-10-27 MED ORDER — ORAL CARE MOUTH RINSE: 15.0000 mL | Freq: Once | OROMUCOSAL | Status: AC

## 2019-10-27 MED ORDER — LACTATED RINGERS IV SOLN: INTRAVENOUS | Status: DC

## 2019-10-27 MED ORDER — LIDOCAINE HCL (CARDIAC) PF 100 MG/5ML IV SOSY
PREFILLED_SYRINGE | INTRAVENOUS | Status: DC | PRN
  Administered 2019-10-27: 100 mg via INTRAVENOUS

## 2019-10-27 MED ORDER — SODIUM CHLORIDE 0.9% IV SOLUTION: Freq: Once | INTRAVENOUS | Status: AC

## 2019-10-27 MED ORDER — GLYCOPYRROLATE 0.2 MG/ML IJ SOLN
INTRAMUSCULAR | Status: DC | PRN
  Administered 2019-10-27: .2 mg via INTRAVENOUS

## 2019-10-27 MED ORDER — PROPOFOL 10 MG/ML IV BOLUS
INTRAVENOUS | Status: DC | PRN
  Administered 2019-10-27: 170 mg via INTRAVENOUS

## 2019-10-27 MED ORDER — EPHEDRINE 5 MG/ML INJ
INTRAVENOUS | Status: AC
  Filled 2019-10-27: qty 10

## 2019-10-27 MED ORDER — MIDAZOLAM HCL 2 MG/2ML IJ SOLN
INTRAMUSCULAR | Status: AC
  Filled 2019-10-27: qty 2

## 2019-10-27 MED ORDER — GABAPENTIN 300 MG PO CAPS: 300.0000 mg | ORAL_CAPSULE | ORAL | Status: AC

## 2019-10-27 MED ORDER — PROPOFOL 10 MG/ML IV BOLUS
INTRAVENOUS | Status: AC
  Filled 2019-10-27: qty 20

## 2019-10-27 MED ORDER — CEFAZOLIN SODIUM-DEXTROSE 2-4 GM/100ML-% IV SOLN
INTRAVENOUS | Status: AC
  Filled 2019-10-27: qty 100

## 2019-10-27 MED ORDER — BUPIVACAINE HCL (PF) 0.25 % IJ SOLN
INTRAMUSCULAR | Status: DC | PRN
  Administered 2019-10-27: 30 mL

## 2019-10-27 MED ORDER — BUPIVACAINE LIPOSOME 1.3 % IJ SUSP
INTRAMUSCULAR | Status: AC
  Filled 2019-10-27: qty 20

## 2019-10-27 SURGICAL SUPPLY — 34 items
APPLIER CLIP 11 MED OPEN (CLIP)
APPLIER CLIP 13 LRG OPEN (CLIP)
BLADE CLIPPER SURG (BLADE) ×3 IMPLANT
CANISTER SUCT 3000ML PPV (MISCELLANEOUS) ×3 IMPLANT
CHLORAPREP W/TINT 26 (MISCELLANEOUS) ×3 IMPLANT
CLIP APPLIE 11 MED OPEN (CLIP) IMPLANT
CLIP APPLIE 13 LRG OPEN (CLIP) IMPLANT
COVER WAND RF STERILE (DRAPES) ×3 IMPLANT
DERMABOND ADVANCED (GAUZE/BANDAGES/DRESSINGS) ×4
DERMABOND ADVANCED .7 DNX12 (GAUZE/BANDAGES/DRESSINGS) ×2 IMPLANT
DRAPE LAPAROTOMY 100X77 ABD (DRAPES) ×3 IMPLANT
DRSG TELFA 3X8 NADH (GAUZE/BANDAGES/DRESSINGS) IMPLANT
ELECT CAUTERY BLADE 6.4 (BLADE) ×3 IMPLANT
ELECT REM PT RETURN 9FT ADLT (ELECTROSURGICAL) ×3
ELECTRODE REM PT RTRN 9FT ADLT (ELECTROSURGICAL) ×1 IMPLANT
GAUZE SPONGE 4X4 12PLY STRL (GAUZE/BANDAGES/DRESSINGS) IMPLANT
GLOVE BIO SURGEON STRL SZ7 (GLOVE) ×6 IMPLANT
GOWN STRL REUS W/ TWL LRG LVL3 (GOWN DISPOSABLE) ×3 IMPLANT
GOWN STRL REUS W/TWL LRG LVL3 (GOWN DISPOSABLE) ×6
MESH VENTRALEX ST 8CM LRG (Mesh General) ×3 IMPLANT
NEEDLE HYPO 22GX1.5 SAFETY (NEEDLE) ×3 IMPLANT
NEEDLE HYPO 25X1 1.5 SAFETY (NEEDLE) ×3 IMPLANT
PACK BASIN MINOR (MISCELLANEOUS) ×3 IMPLANT
SPONGE LAP 18X18 RF (DISPOSABLE) ×6 IMPLANT
STAPLER SKIN PROX 35W (STAPLE) ×3 IMPLANT
SUT ETHIBOND 0 MO6 C/R (SUTURE) ×6 IMPLANT
SUT MNCRL 4-0 (SUTURE) ×2
SUT MNCRL 4-0 27XMFL (SUTURE) ×1
SUT VIC AB 2-0 SH 27 (SUTURE) ×4
SUT VIC AB 2-0 SH 27XBRD (SUTURE) ×2 IMPLANT
SUTURE MNCRL 4-0 27XMF (SUTURE) ×1 IMPLANT
SYR 20ML LL LF (SYRINGE) ×6 IMPLANT
TAPE MICROFOAM 4IN (TAPE) IMPLANT
WATER STERILE IRR 1000ML POUR (IV SOLUTION) ×3 IMPLANT

## 2019-10-27 NOTE — Anesthesia Preprocedure Evaluation (Addendum)
Anesthesia Evaluation  Patient identified by MRN, date of birth, ID band Patient awake    Reviewed: Allergy & Precautions, H&P , NPO status , Patient's Chart, lab work & pertinent test results  Airway Mallampati: II  TM Distance: <3 FB Neck ROM: full    Dental  (+) Teeth Intact   Pulmonary neg pulmonary ROS,    breath sounds clear to auscultation       Cardiovascular negative cardio ROS   Rhythm:regular Rate:Normal     Neuro/Psych negative neurological ROS  negative psych ROS   GI/Hepatic negative GI ROS, (+) Cirrhosis       ,   Endo/Other  Hypothyroidism   Renal/GU      Musculoskeletal   Abdominal   Peds  Hematology  (+) Blood dyscrasia, , Thrombocytopenia.  Required 2 units of platelets for last surgery (cholecystectomy).  Receiving 1 unit of platelets pre-op with 2 units on hold for OR   Anesthesia Other Findings Past Medical History: No date: Cirrhosis of liver (HCC) No date: Diverticulitis No date: Family history of adverse reaction to anesthesia     Comment:  MOTHER HAD N/V No date: History of kidney stones No date: Hypercholesteremia No date: Hypothyroidism No date: Peyronie's disease No date: Renal cyst, left No date: Seasonal allergies No date: Thrombocytopenia ([place])  Past Surgical History: No date: CHOLECYSTECTOMY 2006: COLONOSCOPY 09/08/2017: COLONOSCOPY WITH PROPOFOL; N/A     Comment:  Procedure: COLONOSCOPY WITH PROPOFOL;  Surgeon: [name], MD;  Location: [facility] ENDOSCOPY;  Service:               Gastroenterology;  Laterality: N/A; 01/01/2018: COLONOSCOPY WITH PROPOFOL; N/A     Comment:  Procedure: COLONOSCOPY WITH PROPOFOL;  Surgeon: [name], MD;  Location: [facility] ENDOSCOPY;  Service:               Gastroenterology;  Laterality: N/A; 01/17/2019: CYSTOSCOPY/URETEROSCOPY/HOLMIUM LASER/STENT PLACEMENT;  Right     Comment:  Procedure:  CYSTOSCOPY/URETEROSCOPY/STENT PLACEMENT;                Surgeon: Hollice Espy, MD;  Location: [facility] ORS;                Service: Urology;  Laterality: Right; 01/31/2019: CYSTOSCOPY/URETEROSCOPY/HOLMIUM LASER/STENT PLACEMENT;  Right     Comment:  Procedure: QIONGEXBMW/[id number]/ZDGUYQI LASER/STENT               EXCHANGE;  Surgeon: Hollice Espy, MD;  Location: [facility]               ORS;  Service: Urology;  Laterality: Right; 09/08/2017: ESOPHAGOGASTRODUODENOSCOPY (EGD) WITH PROPOFOL; N/A     Comment:  Procedure: ESOPHAGOGASTRODUODENOSCOPY (EGD) WITH               PROPOFOL;  Surgeon: [name], MD;  Location: [facility]               ENDOSCOPY;  Service: Gastroenterology;  Laterality: N/A; 1981: HERNIA REPAIR 04/19/2019: INTRAOPERATIVE CHOLANGIOGRAM; N/A     Comment:  Procedure: INTRAOPERATIVE CHOLANGIOGRAM;  Surgeon:               Jules Husbands, MD;  Location: [facility] ORS;  Service:               General;  Laterality: N/A; 2001: SHOULDER SURGERY; Right     Comment:  ROTATOR CUFF  REPAIR  BMI    Body Mass Index: 27.44 kg/m      Reproductive/Obstetrics negative OB ROS                            Anesthesia Physical Anesthesia Plan  ASA: III  Anesthesia Plan: General ETT   Post-op Pain Management:    Induction:   PONV Risk Score and Plan: Ondansetron, Dexamethasone and Treatment may vary due to age or medical condition  Airway Management Planned:   Additional Equipment:   Intra-op Plan:   Post-operative Plan:   Informed Consent: I have reviewed the patients History and Physical, chart, labs and discussed the procedure including the risks, benefits and alternatives for the proposed anesthesia with the patient or authorized representative who has indicated his/her understanding and acceptance.     Dental Advisory Given  Plan Discussed with: Anesthesiologist, CRNA and Surgeon  Anesthesia Plan Comments:         Anesthesia Quick Evaluation

## 2019-10-27 NOTE — Anesthesia Procedure Notes (Signed)
Procedure Name: Intubation Date/Time: 10/27/2019 9:04 AM Performed by: Doreen Salvage, CRNA Pre-anesthesia Checklist: Patient identified, Patient being monitored, Timeout performed, Emergency Drugs available and Suction available Patient Re-evaluated:Patient Re-evaluated prior to induction Oxygen Delivery Method: Circle system utilized Preoxygenation: Pre-oxygenation with 100% oxygen Induction Type: IV induction Ventilation: Mask ventilation without difficulty Laryngoscope Size: Mac, McGraph and 4 Grade View: Grade I Tube type: Oral Tube size: 7.5 mm Number of attempts: 2 Airway Equipment and Method: Stylet Placement Confirmation: ETT inserted through vocal cords under direct vision,  positive ETCO2 and breath sounds checked- equal and bilateral Secured at: 21 cm Tube secured with: Tape Dental Injury: Teeth and Oropharynx as per pre-operative assessment

## 2019-10-27 NOTE — Transfer of Care (Signed)
Immediate Anesthesia Transfer of Care Note  Patient: Ryan Willis.  Procedure(s) Performed: Procedure(s): HERNIA REPAIR VENTRAL ADULT, Open (N/A)  Patient Location: PACU  Anesthesia Type:General  Level of Consciousness: sedated  Airway & Oxygen Therapy: Patient Spontanous Breathing and Patient connected to face mask oxygen  Post-op Assessment: Report given to RN and Post -op Vital signs reviewed and stable  Post vital signs: Reviewed and stable  Last Vitals:  Vitals:   10/27/19 0810 10/27/19 1100  BP: 121/73 (!) 97/59  Pulse: 75 75  Resp: 18 14  Temp: 36.6 C (!) 36.3 C  SpO2: 97% 33%    Complications: No apparent anesthesia complications

## 2019-10-27 NOTE — Op Note (Signed)
Incisional epigastric Hernia Repair with Mesh 8 cms ventralex Excisional debridement of skin measuring 4 x 1cm   Pre-operative Diagnosis: incisional hernia  Post-operative Diagnosis: same  Surgeon: Caroleen Hamman, MD FACS  Anesthesia: Gen. with endotracheal tube   Findings: 3cm Hernia  Estimated Blood Loss: 5cc                 Specimens: sac          Complications: none              Procedure Details  The patient was seen again in the Holding Room. The benefits, complications, treatment options, and expected outcomes were discussed with the patient. The risks of bleeding, infection, recurrence of symptoms, failure to resolve symptoms, bowel injury, mesh placement, mesh infection, any of which could require further surgery were reviewed with the patient. The likelihood of improving the patient's symptoms with return to their baseline status is good.  The patient and/or family concurred with the proposed plan, giving informed consent.  The patient was taken to Operating Room, identified and the procedure verified.  A Time Out was held and the above information confirmed.  Prior to the induction of general anesthesia, antibiotic prophylaxis was administered. VTE prophylaxis was in place. General endotracheal anesthesia was then administered and tolerated well. After the induction, the abdomen was prepped with Chloraprep and draped in the sterile fashion. The patient was positioned in the supine position.   Elliptical Incision was created with a scalpel over the hernia defect incorporating prior scar, excisional debridement of skin and subq was performed. Electrocautery was used to dissect through subcutaneous tissue, the hernia sac was opened excised.   The hernia was measured. I injected liposomal marcaine full thickness abd wall under direct visualization. A BARD 8cms Ventralex Large patch selected and secure to the fascia in four corners with ethibond sutures.. I closed the hernia  defect with interrupted 0 Ethibond sutures.   Incision was closed in a 2 layer fashion with 3-0 Vicryl and 4-0 Monocryl. Dermabond was used to coat the skin.  Patient tolerated procedure well and there were no immediate complications. Needle and laparotomy counts were correct

## 2019-10-27 NOTE — Discharge Instructions (Addendum)
AMBULATORY SURGERY  DISCHARGE INSTRUCTIONS   1) The drugs that you were given will stay in your system until tomorrow so for the next 24 hours you should not:  A) Drive an automobile B) Make any legal decisions C) Drink any alcoholic beverage   2) You may resume regular meals tomorrow.  Today it is better to start with liquids and gradually work up to solid foods.  You may eat anything you prefer, but it is better to start with liquids, then soup and crackers, and gradually work up to solid foods.   3) Please notify your doctor immediately if you have any unusual bleeding, trouble breathing, redness and pain at the surgery site, drainage, fever, or pain not relieved by medication.    4) Additional Instructions:        Please contact your physician with any problems or Same Day Surgery at [phone], Monday through Friday 6 am to 4 pm, or [place] [facility] number at [phone].Open Hernia Repair, Adult, Care After These instructions give you information about caring for yourself after your procedure. Your doctor may also give you more specific instructions. If you have problems or questions, contact your doctor. Follow these instructions at home: Surgical cut (incision) care   Follow instructions from your doctor about how to take care of your surgical cut area. Make sure you: ? Wash your hands with soap and water before you change your bandage (dressing). If you cannot use soap and water, use hand sanitizer. ? Change your bandage as told by your doctor. ? Leave stitches (sutures), skin glue, or skin tape (adhesive) strips in place. They may need to stay in place for 2 weeks or longer. If tape strips get loose and curl up, you may trim the loose edges. Do not remove tape strips completely unless your doctor says it is okay.  Check your surgical cut every day for signs of infection. Check for: ? More redness, swelling, or pain. ? More fluid or  blood. ? Warmth. ? Pus or a bad smell. Activity  Do not drive or use heavy machinery while taking prescription pain medicine. Do not drive until your doctor says it is okay.  Until your doctor says it is okay: ? Do not lift anything that is heavier than 10 lb (4.5 kg). ? Do not play contact sports.  Return to your normal activities as told by your doctor. Ask your doctor what activities are safe. General instructions  To prevent or treat having a hard time pooping (constipation) while you are taking prescription pain medicine, your doctor may recommend that you: ? Drink enough fluid to keep your pee (urine) clear or pale yellow. ? Take over-the-counter or prescription medicines. ? Eat foods that are high in fiber, such as fresh fruits and vegetables, whole grains, and beans. ? Limit foods that are high in fat and processed sugars, such as fried and sweet foods.  Take over-the-counter and prescription medicines only as told by your doctor.  Do not take baths, swim, or use a hot tub until your doctor says it is okay.  Keep all follow-up visits as told by your doctor. This is important. Contact a doctor if:  You develop a rash.  You have more redness, swelling, or pain around your surgical cut.  You have more fluid or blood coming from your surgical cut.  Your surgical cut feels warm to the touch.  You have pus or a bad smell coming from your surgical cut.  You have a fever or chills.  You have blood in your poop (stool).  You have not pooped in 2-3 days.  Medicine does not help your pain. Get help right away if:  You have chest pain or you are short of breath.  You feel light-headed.  You feel weak and dizzy (feel faint).  You have very bad pain.  You throw up (vomit) and your pain is worse. This information is not intended to replace advice given to you by your health care provider. Make sure you discuss any questions you have with your health care  provider. Document Revised: 08/06/2018 Document Reviewed: 09/26/2015 Elsevier Patient Education  [place].

## 2019-10-27 NOTE — Interval H&P Note (Signed)
History and Physical Interval Note:  10/27/2019 8:35 AM  Ryan Willis.  has presented today for surgery, with the diagnosis of Ventral hernia.  The various methods of treatment have been discussed with the patient and family. After consideration of risks, benefits and other options for treatment, the patient has consented to  Procedure(s): HERNIA REPAIR VENTRAL ADULT, Open (N/A) as a surgical intervention.  The patient's history has been reviewed, patient examined, no change in status, stable for surgery.  I have reviewed the patient's chart and labs.  Questions were answered to the patient's satisfaction.     [place]

## 2019-10-28 ENCOUNTER — Encounter: Payer: Self-pay | Admitting: Surgery

## 2019-10-28 LAB — BPAM PLATELET PHERESIS
Blood Product Expiration Date: 202107012359
ISSUE DATE / TIME: 202107010644
Unit Type and Rh: 600

## 2019-10-28 LAB — PREPARE PLATELET PHERESIS: Unit division: 0

## 2019-10-28 LAB — SURGICAL PATHOLOGY

## 2019-10-28 NOTE — Anesthesia Postprocedure Evaluation (Signed)
Anesthesia Post Note  Patient: Ryan Willis.  Procedure(s) Performed: HERNIA REPAIR VENTRAL ADULT, Open (N/A )  Patient location during evaluation: PACU Anesthesia Type: General Level of consciousness: awake and alert Pain management: pain level controlled Vital Signs Assessment: post-procedure vital signs reviewed and stable Respiratory status: spontaneous breathing, nonlabored ventilation and respiratory function stable Cardiovascular status: blood pressure returned to baseline and stable Postop Assessment: no apparent nausea or vomiting Anesthetic complications: no   No complications documented.   Last Vitals:  Vitals:   10/27/19 1159 10/27/19 1216  BP: 117/74 124/76  Pulse: 73 80  Resp: 17 17  Temp: 36.4 C 36.5 C  SpO2: 94%     Last Pain:  Vitals:   10/28/19 0757  TempSrc:   PainSc: [place]

## 2019-11-01 ENCOUNTER — Encounter: Payer: Self-pay | Admitting: Gastroenterology

## 2019-11-09 ENCOUNTER — Telehealth: Payer: Self-pay

## 2019-11-09 ENCOUNTER — Telehealth (INDEPENDENT_AMBULATORY_CARE_PROVIDER_SITE_OTHER): Payer: 59 | Admitting: Surgery

## 2019-11-09 ENCOUNTER — Other Ambulatory Visit: Payer: Self-pay

## 2019-11-09 DIAGNOSIS — Z09 Encounter for follow-up examination after completed treatment for conditions other than malignant neoplasm: Secondary | ICD-10-CM

## 2019-11-09 DIAGNOSIS — R599 Enlarged lymph nodes, unspecified: Secondary | ICD-10-CM

## 2019-11-09 DIAGNOSIS — K760 Fatty (change of) liver, not elsewhere classified: Secondary | ICD-10-CM

## 2019-11-09 NOTE — Telephone Encounter (Signed)
Patient verbalized understanding of results. Order CT scan for 6 months

## 2019-11-09 NOTE — Progress Notes (Signed)
Ryan Willis is a 59 year old male status post open ventral hernia repair.   Placed a phone call to his cell phone. He does have a history of cirrhosis.  He did very well.  Experience of minimal discomfort.  No fevers no chills.  No drainage.  No bleeding.  There is some thickening in the area which is expected postoperative changes.  A/p doing well without complications.  He knows to call us back if he is getting pain issues.

## 2019-11-09 NOTE — Telephone Encounter (Signed)
-----   Message from [name], MD sent at 11/01/2019 11:36 AM EDT ----- Inform repeat CT in 6 months to check lymphnodes to ensure not increasing in size. Otherwise all benign findings , shows cirrhosis

## 2019-11-23 NOTE — Telephone Encounter (Signed)
Created in error

## 2020-02-27 ENCOUNTER — Ambulatory Visit: Payer: 59 | Admitting: Physician Assistant

## 2020-03-05 ENCOUNTER — Encounter: Payer: Self-pay | Admitting: Physician Assistant

## 2020-03-05 ENCOUNTER — Other Ambulatory Visit: Payer: Self-pay

## 2020-03-05 ENCOUNTER — Ambulatory Visit (INDEPENDENT_AMBULATORY_CARE_PROVIDER_SITE_OTHER): Payer: 59 | Admitting: Physician Assistant

## 2020-03-05 VITALS — BP 150/75 | HR 65 | Ht 65.0 in | Wt 170.0 lb

## 2020-03-05 DIAGNOSIS — Z87442 Personal history of urinary calculi: Secondary | ICD-10-CM

## 2020-03-05 DIAGNOSIS — N401 Enlarged prostate with lower urinary tract symptoms: Secondary | ICD-10-CM

## 2020-03-05 DIAGNOSIS — R3912 Poor urinary stream: Secondary | ICD-10-CM | POA: Diagnosis not present

## 2020-03-05 DIAGNOSIS — R3129 Other microscopic hematuria: Secondary | ICD-10-CM | POA: Diagnosis not present

## 2020-03-05 LAB — MICROSCOPIC EXAMINATION
Bacteria, UA: NONE SEEN
RBC, Urine: >30 /hpf — AB (ref 0–2)

## 2020-03-05 LAB — URINALYSIS, COMPLETE
Bilirubin, UA: NEGATIVE
Glucose, UA: NEGATIVE
Ketones, UA: NEGATIVE
Nitrite, UA: NEGATIVE
Protein,UA: NEGATIVE
Specific Gravity, UA: 1.02 (ref 1.005–1.030)
Urobilinogen, Ur: 1 mg/dL (ref 0.2–1.0)
pH, UA: 6.5 (ref 5.0–7.5)

## 2020-03-05 NOTE — Progress Notes (Signed)
03/05/2020 9:20 AM   Ryan Willis. 1960/12/05 [id number]  CC: Chief Complaint  Patient presents with  . Nephrolithiasis    HPI: Ryan Willis. is a 59 y.o. male with a history of nephrolithiasis s/p staged right ureteroscopy with Dr. Erlene Quan in 2020 for management of a 10 mm right UVJ stone who presents today for annual stone follow-up.  He declines KUB today, stating he gets renal ultrasounds versus CT scans every 6 months by his PCP.  He underwent CTAP with contrast on 10/15/2019 for evaluation of abdominal pain; he was noted to have nonobstructing left nephrolithiasis at that time. On imaging review, he has 2 predominant lower pole stones, each measuring approximately 9 to 10 mm in the greatest dimension.  These appear stable in size compared to prior.  Today, patient reports no flank pain or gross hematuria since his last clinic visit.  He underwent incisional hernia repair with Dr. Dahlia Byes 4 months ago and states he was notified that he had microscopic hematuria at that time.  He is a never smoker and denies using other tobacco products.  Additionally, patient reports noticing weak urinary stream after stopping Flomax following his most recent stone episode.  He feels he is emptying his bladder well and has minimal bother with this.  He denies nocturia, intermittency, or straining.  Patient gets annual physicals through an outside facility and states they have been checking his PSA regularly.  He believes his most recent PSA draw was early 2021, however he does not remember the value.  He has not been getting DRE's, however he is refusing one today.  In-office UA today positive for 2+ blood and trace leukocyte esterase; urine microscopy with 6-10 WBCs/HPF and >30 RBCs/HPF.  PVR 0 mL.  PMH: Past Medical History:  Diagnosis Date  . Cirrhosis of liver ([place])   . Diverticulitis   . Family history of adverse reaction to anesthesia    MOTHER HAD N/V  . History of kidney stones   .  Hypercholesteremia   . Hypothyroidism   . Peyronie's disease   . Renal cyst, left   . Seasonal allergies   . Thrombocytopenia [facility])     Surgical History: Past Surgical History:  Procedure Laterality Date  . CHOLECYSTECTOMY    . COLONOSCOPY  2006  . COLONOSCOPY WITH PROPOFOL N/A 09/08/2017   Procedure: COLONOSCOPY WITH PROPOFOL;  Surgeon: [name], MD;  Location: [facility] ENDOSCOPY;  Service: Gastroenterology;  Laterality: N/A;  . COLONOSCOPY WITH PROPOFOL N/A 01/01/2018   Procedure: COLONOSCOPY WITH PROPOFOL;  Surgeon: [name], MD;  Location: [facility] ENDOSCOPY;  Service: Gastroenterology;  Laterality: N/A;  . CYSTOSCOPY/URETEROSCOPY/HOLMIUM LASER/STENT PLACEMENT Right 01/17/2019   Procedure: CYSTOSCOPY/URETEROSCOPY/STENT PLACEMENT;  Surgeon: Hollice Espy, MD;  Location: [facility] ORS;  Service: Urology;  Laterality: Right;  . CYSTOSCOPY/URETEROSCOPY/HOLMIUM LASER/STENT PLACEMENT Right 01/31/2019   Procedure: CYSTOSCOPY/URETEROSCOPY/HOLMIUM LASER/STENT EXCHANGE;  Surgeon: Hollice Espy, MD;  Location: [facility] ORS;  Service: Urology;  Laterality: Right;  . ESOPHAGOGASTRODUODENOSCOPY (EGD) WITH PROPOFOL N/A 09/08/2017   Procedure: ESOPHAGOGASTRODUODENOSCOPY (EGD) WITH PROPOFOL;  Surgeon: [name], MD;  Location: [facility] ENDOSCOPY;  Service: Gastroenterology;  Laterality: N/A;  . HERNIA REPAIR  1981  . INTRAOPERATIVE CHOLANGIOGRAM N/A 04/19/2019   Procedure: INTRAOPERATIVE CHOLANGIOGRAM;  Surgeon: Jules Husbands, MD;  Location: [facility] ORS;  Service: General;  Laterality: N/A;  . SHOULDER SURGERY Right 2001   ROTATOR CUFF REPAIR  . VENTRAL HERNIA REPAIR N/A 10/27/2019   Procedure: HERNIA REPAIR VENTRAL ADULT, Open;  Surgeon: Jules Husbands, MD;  Location: [facility] ORS;  Service: General;  Laterality: N/A;    Home Medications:  Allergies as of 03/05/2020   No Known Allergies     Medication List       Accurate as of March 05, 2020  9:20 AM. If you have any questions, ask your nurse or doctor.          multivitamin with minerals Tabs tablet Take 1 tablet by mouth daily.   oxyCODONE 5 MG immediate release tablet Commonly known as: Oxy IR/ROXICODONE Take 1 tablet (5 mg total) by mouth every 4 (four) hours as needed for severe pain.       Allergies:  No Known Allergies  Family History: Family History  Problem Relation Age of Onset  . Kidney cancer Mother   . Lung cancer Mother   . Colon cancer Father     Social History:   reports that he has never smoked. He has never used smokeless tobacco. He reports previous alcohol use. He reports that he does not use drugs.  Physical Exam: BP (!) 150/75 (BP Location: Left Arm, Patient Position: Sitting, Cuff Size: Normal)   Pulse 65   Ht 5' 5"  (1.651 m)   Wt 170 lb (77.1 kg)   BMI 28.29 kg/m   Constitutional:  Alert and oriented, no acute distress, nontoxic appearing HEENT: [place], AT Cardiovascular: No clubbing, cyanosis, or edema Respiratory: Normal respiratory effort, no increased work of breathing Skin: No rashes, bruises or suspicious lesions Neurologic: Grossly intact, no focal deficits, moving all 4 extremities Psychiatric: Normal mood and affect  Laboratory Data: Results for orders placed or performed in visit on 03/05/20  Urinalysis, Complete  Result Value Ref Range   Scan Result 66m    Assessment & Plan:   1. History of nephrolithiasis Persistent, stable nonobstructing left nephrolithiasis seen on recent CT.  I offered the patient continued surveillance versus stone management.  In the absence of stone symptoms, patient would prefer to defer stone management at this time.  I am in agreement with this plan.  2. Benign prostatic hyperplasia with weak urinary stream Minimal bother, PVR WNL.  Will defer pharmacotherapy and plan for annual IPSS at his next visit.  We discussed that annual PSA and DRE are part of the annual healthy male exam and that DRE can sometimes reveal nodules despite normal PSA.  Patient expressed  understanding and requests to defer DRE at this time. - Bladder Scan (Post Void Residual) in office  3. Microscopic hematuria Likely consistent with #1 above.  He denies gross hematuria and is a never smoker.  UA today also notable for mild pyuria, will send for culture for further evaluation and treat as indicated.  Ultimately, patient may benefit from cystoscopy to rule out lower urinary tract sources of microscopic hematuria. - Urinalysis, Complete - CULTURE, URINE COMPREHENSIVE   Return in about 1 year (around 03/05/2021) for Annual stone and BPH follow-up with IPSS.  SDebroah Loop PA-C  [facility] [address] [place] [phone]

## 2020-03-08 LAB — CULTURE, URINE COMPREHENSIVE

## 2020-03-09 ENCOUNTER — Telehealth: Payer: Self-pay | Admitting: Physician Assistant

## 2020-03-09 NOTE — Telephone Encounter (Signed)
Please contact the patient and inform him that his recent urine culture from his visit with me has come back negative.  This rules out infection as the source of the blood in his urine.  Given his history of blood in the urine, I recommend that we follow-up with cystoscopy with Dr. Erlene Quan in the office.  Please explain that this will allow [place] to evaluate his lower urinary tract for sources of bleeding.  While he may recall that we discussed his stones may be the source of blood in the urine, it is important that we rule out other sources in his bladder, prostate, or urethra as the source as well.  Please schedule this per Dr. Cherrie Gauze schedule.

## 2020-03-12 NOTE — Telephone Encounter (Signed)
Attempted patient twice. LMOM for patient to please return call. See note.

## 2020-03-13 NOTE — Telephone Encounter (Signed)
Spoke with patient and advised results Cysto scheduled

## 2020-03-14 DIAGNOSIS — D696 Thrombocytopenia, unspecified: Secondary | ICD-10-CM | POA: Diagnosis not present

## 2020-03-14 DIAGNOSIS — K7460 Unspecified cirrhosis of liver: Secondary | ICD-10-CM | POA: Diagnosis not present

## 2020-03-15 ENCOUNTER — Encounter: Payer: Self-pay | Admitting: Internal Medicine

## 2020-03-19 ENCOUNTER — Encounter: Payer: Self-pay | Admitting: Internal Medicine

## 2020-03-19 ENCOUNTER — Inpatient Hospital Stay: Payer: 59 | Attending: Internal Medicine | Admitting: Internal Medicine

## 2020-03-19 ENCOUNTER — Other Ambulatory Visit: Payer: Self-pay

## 2020-03-19 VITALS — BP 125/67 | HR 63 | Temp 97.2°F | Resp 16 | Ht 65.0 in | Wt 177.3 lb

## 2020-03-19 DIAGNOSIS — R3129 Other microscopic hematuria: Secondary | ICD-10-CM | POA: Insufficient documentation

## 2020-03-19 DIAGNOSIS — K7460 Unspecified cirrhosis of liver: Secondary | ICD-10-CM | POA: Diagnosis not present

## 2020-03-19 DIAGNOSIS — E039 Hypothyroidism, unspecified: Secondary | ICD-10-CM | POA: Insufficient documentation

## 2020-03-19 DIAGNOSIS — D696 Thrombocytopenia, unspecified: Secondary | ICD-10-CM | POA: Diagnosis not present

## 2020-03-19 DIAGNOSIS — R161 Splenomegaly, not elsewhere classified: Secondary | ICD-10-CM | POA: Diagnosis not present

## 2020-03-19 NOTE — Progress Notes (Signed)
[facility] CONSULT NOTE  Patient Care Team: Patient, No Pcp Per as PCP - General (General Practice)  CHIEF COMPLAINTS/PURPOSE OF CONSULTATION: Thrombocytopenia  # THROMBOCYTOPENIA [97-108; incidental]; Hep B/hepC/HIV-NEG [[facility]] sec to cirrhosis/ splenomegaly  # Cirrhosis/ SPLENOMEGALY[US- 1000cc; CT A/P march 2019] s/p EGD/colo [may 2019- Dr.Anna]  #October 2020-kidney stones s/p stenting [Dr. Erlene Quan; gallstone/cholecystitis-s/p cholecystectomy December 2020]  # Peyronie disease-   Oncology History   No history exists.   HISTORY OF PRESENTING ILLNESS:  Ryan Willis. 59 y.o.  male cirrhosis/splenomegaly and thrombocytopenia is here for follow-up.  Patient has recovered fairly well from his ventral hernia surgery/repair from his previous cholecystectomy.  He denies any easy bruising or bleeding.  No nausea vomiting or swelling in the legs.  Is currently awaiting cystoscopy given microscopic hematuria.  Review of Systems  Constitutional: Positive for weight loss. Negative for chills, diaphoresis, fever and malaise/fatigue.  HENT: Negative for nosebleeds and sore throat.   Eyes: Negative for double vision.  Respiratory: Negative for cough, hemoptysis, sputum production, shortness of breath and wheezing.   Cardiovascular: Negative for chest pain, palpitations, orthopnea and leg swelling.  Gastrointestinal: Negative for abdominal pain, blood in stool, constipation, diarrhea, heartburn, melena, nausea and vomiting.  Genitourinary: Negative for dysuria, frequency and urgency.  Musculoskeletal: Negative for back pain and joint pain.  Skin: Negative.  Negative for itching and rash.  Neurological: Negative for dizziness, tingling, focal weakness, weakness and headaches.  Endo/Heme/Allergies: Bruises/bleeds easily.  Psychiatric/Behavioral: Negative for depression. The patient is not nervous/anxious and does not have insomnia.     MEDICAL HISTORY:  Past Medical  History:  Diagnosis Date  . Cirrhosis of liver ([place])   . Diverticulitis   . Family history of adverse reaction to anesthesia    MOTHER HAD N/V  . History of kidney stones   . Hypercholesteremia   . Hypothyroidism   . Peyronie's disease   . Renal cyst, left   . Seasonal allergies   . Thrombocytopenia ([place])     SURGICAL HISTORY: Past Surgical History:  Procedure Laterality Date  . CHOLECYSTECTOMY    . COLONOSCOPY  2006  . COLONOSCOPY WITH PROPOFOL N/A 09/08/2017   Procedure: COLONOSCOPY WITH PROPOFOL;  Surgeon: [name], MD;  Location: [facility] ENDOSCOPY;  Service: Gastroenterology;  Laterality: N/A;  . COLONOSCOPY WITH PROPOFOL N/A 01/01/2018   Procedure: COLONOSCOPY WITH PROPOFOL;  Surgeon: [name], MD;  Location: [facility] ENDOSCOPY;  Service: Gastroenterology;  Laterality: N/A;  . CYSTOSCOPY/URETEROSCOPY/HOLMIUM LASER/STENT PLACEMENT Right 01/17/2019   Procedure: CYSTOSCOPY/URETEROSCOPY/STENT PLACEMENT;  Surgeon: Hollice Espy, MD;  Location: [facility] ORS;  Service: Urology;  Laterality: Right;  . CYSTOSCOPY/URETEROSCOPY/HOLMIUM LASER/STENT PLACEMENT Right 01/31/2019   Procedure: CYSTOSCOPY/URETEROSCOPY/HOLMIUM LASER/STENT EXCHANGE;  Surgeon: Hollice Espy, MD;  Location: [facility] ORS;  Service: Urology;  Laterality: Right;  . ESOPHAGOGASTRODUODENOSCOPY (EGD) WITH PROPOFOL N/A 09/08/2017   Procedure: ESOPHAGOGASTRODUODENOSCOPY (EGD) WITH PROPOFOL;  Surgeon: [name], MD;  Location: [facility] ENDOSCOPY;  Service: Gastroenterology;  Laterality: N/A;  . HERNIA REPAIR  1981  . INTRAOPERATIVE CHOLANGIOGRAM N/A 04/19/2019   Procedure: INTRAOPERATIVE CHOLANGIOGRAM;  Surgeon: Jules Husbands, MD;  Location: [facility] ORS;  Service: General;  Laterality: N/A;  . SHOULDER SURGERY Right 2001   ROTATOR CUFF REPAIR  . VENTRAL HERNIA REPAIR N/A 10/27/2019   Procedure: HERNIA REPAIR VENTRAL ADULT, Open;  Surgeon: Jules Husbands, MD;  Location: [facility] ORS;  Service: General;  Laterality: N/A;    SOCIAL HISTORY:   Social History   Socioeconomic History  . Marital status: Married  Spouse name: Not on file  . Number of children: Not on file  . Years of education: Not on file  . Highest education level: Not on file  Occupational History  . Not on file  Tobacco Use  . Smoking status: Never Smoker  . Smokeless tobacco: Never Used  Vaping Use  . Vaping Use: Never used  Substance and Sexual Activity  . Alcohol use: Not Currently    Comment: "1 or 2 year drinks a year."  . Drug use: No  . Sexual activity: Not on file  Other Topics Concern  . Not on file  Social History Narrative   retd. Engineer, structural; part time information specialist; never smoked; no alcohol; live in [place]. Lives with wife.    Social Determinants of Health   Financial Resource Strain:   . Difficulty of Paying Living Expenses: Not on file  Food Insecurity:   . Worried About Charity fundraiser in the Last Year: Not on file  . Ran Out of Food in the Last Year: Not on file  Transportation Needs:   . Lack of Transportation (Medical): Not on file  . Lack of Transportation (Non-Medical): Not on file  Physical Activity:   . Days of Exercise per Week: Not on file  . Minutes of Exercise per Session: Not on file  Stress:   . Feeling of Stress : Not on file  Social Connections:   . Frequency of Communication with Friends and Family: Not on file  . Frequency of Social Gatherings with Friends and Family: Not on file  . Attends Religious Services: Not on file  . Active Member of Clubs or Organizations: Not on file  . Attends Archivist Meetings: Not on file  . Marital Status: Not on file  Intimate Partner Violence:   . Fear of Current or Ex-Partner: Not on file  . Emotionally Abused: Not on file  . Physically Abused: Not on file  . Sexually Abused: Not on file    FAMILY HISTORY: colon father- 44 year; mother- kidney cancer; died of lung cancer [2018] Family History  Problem Relation Age of Onset  . Kidney  cancer Mother   . Lung cancer Mother   . Colon cancer Father     ALLERGIES:  has No Known Allergies.  MEDICATIONS:  Current Outpatient Medications  Medication Sig Dispense Refill  . Multiple Vitamin (MULTIVITAMIN WITH MINERALS) TABS tablet Take 1 tablet by mouth daily.     No current facility-administered medications for this visit.   PHYSICAL EXAMINATION: ECOG PERFORMANCE STATUS: 0 - Asymptomatic  Vitals:   03/19/20 1041  BP: 125/67  Pulse: 63  Resp: 16  Temp: (!) 97.2 F (36.2 C)  SpO2: 97%   Filed Weights   03/19/20 1041  Weight: 177 lb 4.8 oz (80.4 kg)   Physical Exam HENT:     Head: Normocephalic and atraumatic.     Mouth/Throat:     Pharynx: No oropharyngeal exudate.  Eyes:     Pupils: Pupils are equal, round, and reactive to light.  Cardiovascular:     Rate and Rhythm: Normal rate and regular rhythm.  Pulmonary:     Effort: No respiratory distress.     Breath sounds: No wheezing.  Abdominal:     General: Bowel sounds are normal. There is no distension.     Palpations: Abdomen is soft. There is no mass.     Tenderness: There is no abdominal tenderness. There is no guarding or rebound.  Musculoskeletal:  General: No tenderness. Normal range of motion.     Cervical back: Normal range of motion and neck supple.  Skin:    General: Skin is warm.  Neurological:     Mental Status: He is alert and oriented to person, place, and time.  Psychiatric:        Mood and Affect: Affect normal.      LABORATORY DATA:  I have reviewed the data as listed Lab Results  Component Value Date   WBC 3.5 (L) 10/10/2019   HGB 14.2 10/10/2019   HCT 41.0 10/10/2019   MCV 95.1 10/10/2019   PLT 73 (L) 10/10/2019   Recent Labs    04/19/19 1818 04/19/19 1818 04/20/19 0452 06/09/19 0832 10/10/19 1222  NA 138   < > 141 142 141  K 4.4   < > 4.0 4.3 4.7  CL 109   < > 109 108* 106  CO2 18*  --  25  --  26  GLUCOSE 140*   < > 108* 88 95  BUN 15   < > 14 12 11    CREATININE 0.82   < > 0.76 0.86 0.94  CALCIUM 8.5*   < > 8.2* 9.3 9.6  GFRNONAA >60   < > >60 95 >60  GFRAA >60   < > >60 110 >60  PROT 6.0*   < > 5.3* 6.4 7.5  ALBUMIN 3.7   < > 3.2* 3.9 4.3  AST 76*   < > 51* 31 33  ALT 57*   < > 43 19 24  ALKPHOS 57   < > 45 98 82  BILITOT 1.7*   < > 1.9* 1.3* 1.9*   < > = values in this interval not displayed.    RADIOGRAPHIC STUDIES: I have personally reviewed the radiological images as listed and agreed with the findings in the report. No results found.  ASSESSMENT & PLAN:   Thrombocytopenia ([place]) # Thrombocytopenia/splenomegaly-Hypersplenism- [VCBSWHQ 2019 platelets- between 106-97] NOV 2021- platelets 51-. Trending down but overall clinically STABLE;   Asymptomatic.  # Cirrhosis/splenomegaly-MRI in May 2020/US-March 2021-negative for [place]; NOV ultrasound/GI-AFP 9.6 [n-8.3]; monitor for now.   #Microscopic hematuria- awaiting cystoscopy [Dr.Brandon]  # DISPOSITION: * will call  # US liver in 1 month-  # follow up in 6 months; MD, labs -cbc/cmp/afp/Pt/PTT [[facility]]; - Dr.B   All questions were answered. The patient knows to call the clinic with any problems, questions or concerns.    Cammie Sickle, MD 03/26/2020 8:19 AM

## 2020-03-19 NOTE — Assessment & Plan Note (Addendum)
#   Thrombocytopenia/splenomegaly-Hypersplenism- [January 2019 platelets- between 106-97] NOV 2021- platelets 51-. Trending down but overall clinically STABLE;   Asymptomatic.  # Cirrhosis/splenomegaly-MRI in May 2020/US-March 2021-negative for [place]; NOV ultrasound/GI-AFP 9.6 [n-8.3]; monitor for now.   #Microscopic hematuria- awaiting cystoscopy [Dr.Brandon]  # DISPOSITION: * will call  # US liver in 1 month-  # follow up in 6 months; MD, labs -cbc/cmp/afp/Pt/PTT [[facility]]; - Dr.B

## 2020-03-23 IMAGING — RF DG CHOLANGIOGRAM VIA EXIST CATHETER
4 series · 4 of 4 positions shown · non-contrast
Comparison: none

INDICATION: 57-year-old male with NASH cirrhosis and a history of acute calculus
cholecystitis. At the time of initial presentation, he was a poor
operative candidate and was therefore treated with percutaneous
cholecystostomy tube placement on 01/26/2019. He presents today for
initial cholangiogram.

[Series 1: cp_standard · 0.17mm/px · 1 of 1 slices shown (1 of 4)]
[im 1/1]
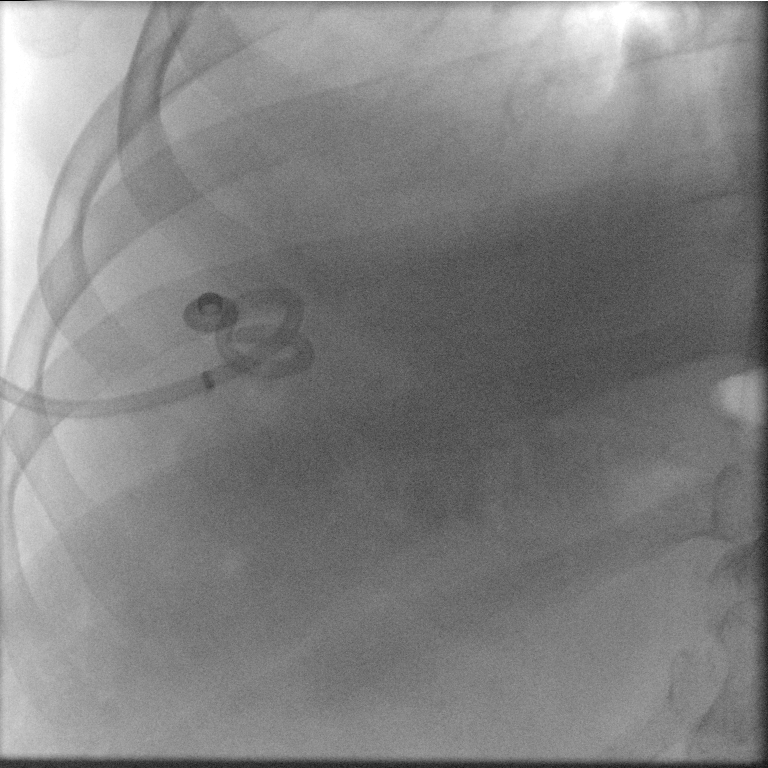

[Series 2: cp_standard · 0.17mm/px · 1 of 1 slices shown (2 of 4)]
[im 1/1]
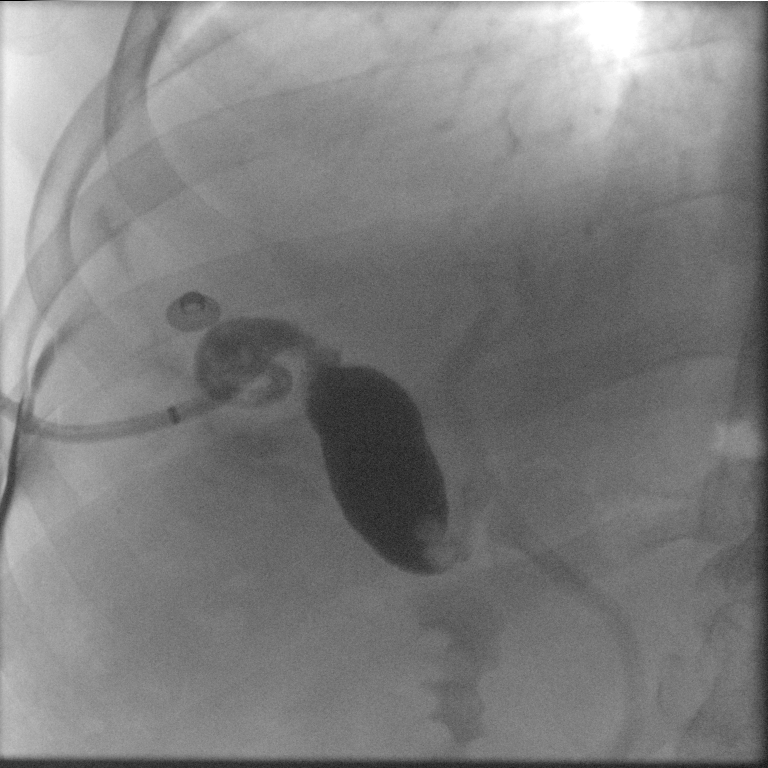

[Series 3: cp_standard · 0.17mm/px · 1 of 1 slices shown (3 of 4)]
[im 1/1]
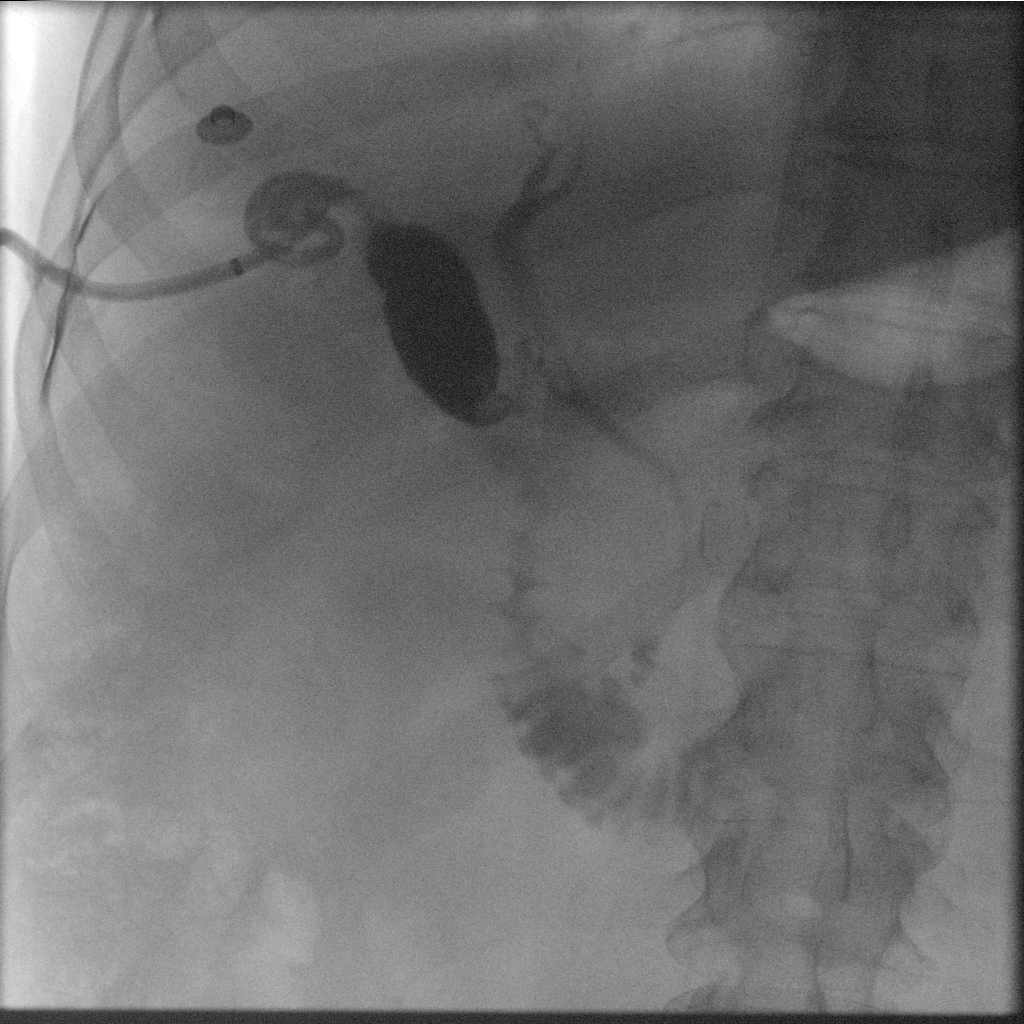

[Series 4: cp_standard · 0.17mm/px · 1 of 1 slices shown (4 of 4)]
[im 1/1]
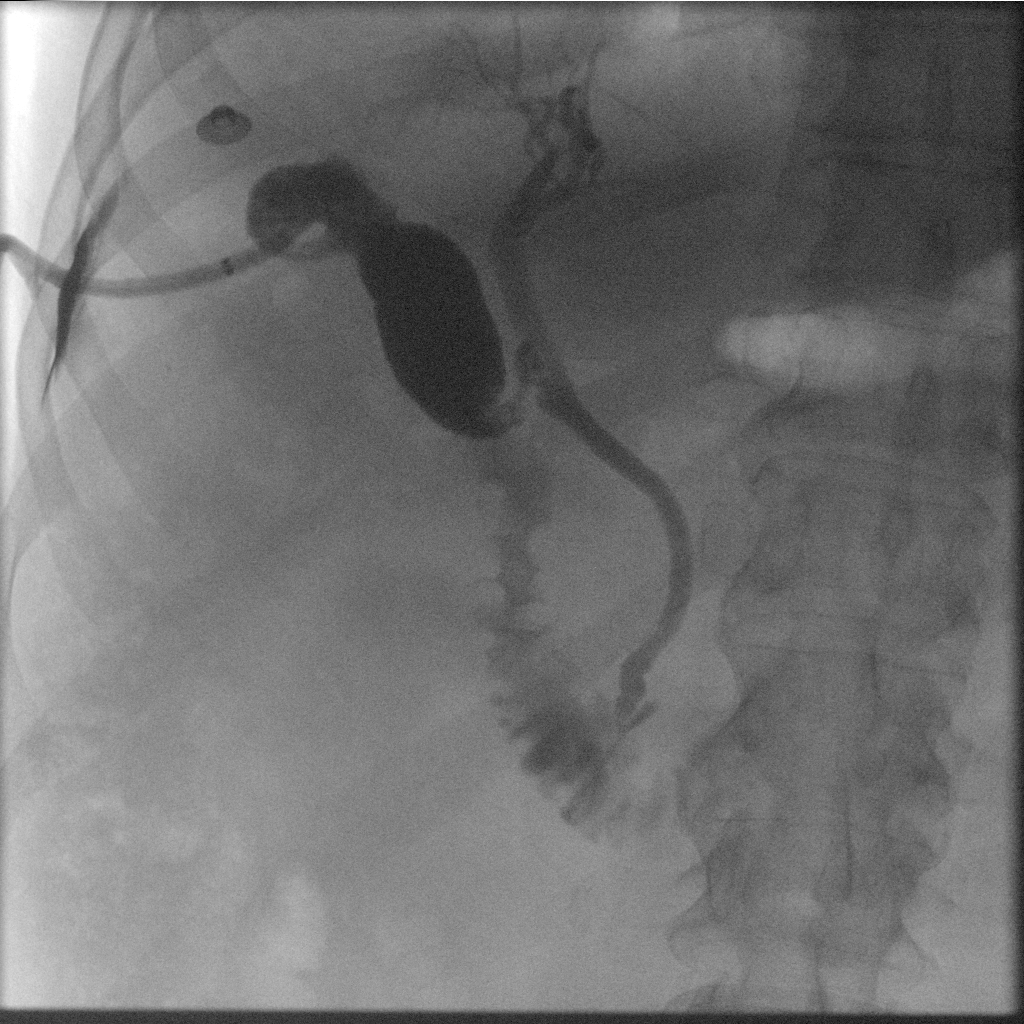

[4 of 4 positions shown; findings below may reference images not displayed]

EXAM:
Cholangiogram through existing tube

MEDICATIONS:
None

ANESTHESIA/SEDATION:
None

FLUOROSCOPY TIME:  Fluoroscopy Time: 0 minutes 36 seconds (11.5
mGy).

COMPLICATIONS:
None immediate.

PROCEDURE:
Informed written consent was obtained from the patient after a
thorough discussion of the procedural risks, benefits and
alternatives. All questions were addressed. Maximal Sterile Barrier
Technique was utilized including caps, mask, sterile gowns, sterile
gloves, sterile drape, hand hygiene and skin antiseptic. A timeout
was performed prior to the initiation of the procedure.

A hand injection of contrast was performed under fluoroscopic
imaging. The drainage catheter appears to have pulled back into the
transhepatic tract. The catheter continues to communicate with the
gallbladder lumen. Small filling defects in the gallbladder neck
consistent with cholelithiasis. The cystic duct is patent. The
common bile duct is patent. Contrast material passes through the
ampulla and into the duodenum. Minimal reflux into the distal common
pancreatic duct.
IMPRESSION: 1. The drainage catheter has partially pulled back into the
transhepatic tract but remains in communication with the gallbladder
lumen.
2. Cholelithiasis without cystic duct obstruction.
3. No evidence of choledocholithiasis.

PLAN:
1. Patient to follow-up with Dr. Gieniu to discuss elective
cholecystectomy.
2. [REDACTED] the last week [DATE] for
cholecystostomy tube exchange.

## 2020-03-27 ENCOUNTER — Other Ambulatory Visit: Payer: Self-pay

## 2020-03-27 ENCOUNTER — Encounter: Payer: Self-pay | Admitting: Urology

## 2020-03-27 ENCOUNTER — Ambulatory Visit (INDEPENDENT_AMBULATORY_CARE_PROVIDER_SITE_OTHER): Payer: 59 | Admitting: Urology

## 2020-03-27 VITALS — BP 138/78 | HR 59 | Ht 65.0 in | Wt 177.0 lb

## 2020-03-27 DIAGNOSIS — R3129 Other microscopic hematuria: Secondary | ICD-10-CM | POA: Diagnosis not present

## 2020-03-27 DIAGNOSIS — N401 Enlarged prostate with lower urinary tract symptoms: Secondary | ICD-10-CM

## 2020-03-27 DIAGNOSIS — Z87442 Personal history of urinary calculi: Secondary | ICD-10-CM

## 2020-03-27 DIAGNOSIS — R3912 Poor urinary stream: Secondary | ICD-10-CM

## 2020-03-27 NOTE — Progress Notes (Signed)
   03/27/20  CC:  Chief Complaint  Patient presents with  . Cysto    HPI: 59 year old male with a personal history of microscopic hematuria, kidney stones and thrombocytopenia who presents today for cystoscopic evaluation.  Please see previous notes for details.  Most recent CT of the abdomen pelvis with contrast on 10/15/2019 unremarkable for GU pathology.  He has clusters of subcentimeter nonobstructing left-sided nonobstructing stones as well as benign-appearing bilateral renal cyst.  Blood pressure 138/78, pulse (!) 59, height 5' 5"  (1.651 m), weight 177 lb (80.3 kg). NED. A&Ox3.   No respiratory distress   Abd soft, NT, ND Normal phallus with bilateral descended testicles  Cystoscopy Procedure Note  Patient identification was confirmed, informed consent was obtained, and patient was prepped using Betadine solution.  Lidocaine jelly was administered per urethral meatus.     Pre-Procedure: - Inspection reveals a normal caliber ureteral meatus.  Procedure: The flexible cystoscope was introduced without difficulty - No urethral strictures/lesions are present. - Normal prostate  - Mildly elevated bladder neck - Bilateral ureteral orifices identified - Bladder mucosa  reveals no ulcers, tumors, or lesions - No bladder stones - No trabeculation  Retroflexion shows no discrete median lobe   Post-Procedure: - Patient tolerated the procedure well  Assessment/ Plan:  1. Microscopic hematuria Status post unremarkable cystoscopy today  Suspect this is likely related to either prostamegaly and/or stones in the setting of thrombocytopenia  We will continue to follow and reassess, recheck bladder as needed. - Urinalysis, Complete  2. Benign prostatic hyperplasia with weak urinary stream Enlarged prostate appreciated on cystoscopy today without discrete median lobe  Consider pharmacotherapy versus intervention if becomes more symptomatic  3. History of  nephrolithiasis Nonobstructing stones, asymptomatic, will follow clinically with KUB in 1 year     Hollice Espy, MD

## 2020-03-28 ENCOUNTER — Ambulatory Visit (INDEPENDENT_AMBULATORY_CARE_PROVIDER_SITE_OTHER): Payer: 59 | Admitting: Gastroenterology

## 2020-03-28 ENCOUNTER — Encounter: Payer: Self-pay | Admitting: Gastroenterology

## 2020-03-28 VITALS — BP 134/84 | HR 57 | Ht 65.0 in | Wt 178.6 lb

## 2020-03-28 DIAGNOSIS — K7460 Unspecified cirrhosis of liver: Secondary | ICD-10-CM | POA: Diagnosis not present

## 2020-03-28 DIAGNOSIS — R599 Enlarged lymph nodes, unspecified: Secondary | ICD-10-CM

## 2020-03-28 NOTE — Progress Notes (Signed)
Ryan Bellows MD, MRCP(U.K) [address]  [place]  [place], [place] [address]  Main: [phone]  Fax: [phone]   Primary Care Physician: Patient, No Pcp Per  Primary Gastroenterologist:  Dr. Jonathon Willis    Follow-up for liver cirrhosis secondary to NAFLD.  HPI: Ryan Willis. is a 59 y.o. male    Summary of history :  H/oliver cirrhosis secondary to NAFLD  CT scan on 07/15/17 which showed features of cirrhosis and portal hypertension withwith splenomegaly, a recanalized left paraumbilical vein and small distal esophageal varices.His dad had colon cancer.. 01/01/18 : colonoscopy- 3 mm tubular adenoma resected  09/08/17- EGD: No varicesantral gastropathy on bx.  Labs 09/14/17 : Ceruloplasmin low 15.2. Normal 24 hour urinary copperHe did have eye exam and was told is normal.  08/24/17 : Hep B surface ab- positive, ANA ,SM ab,LKM, immunoglubulins ,celiac serology,A1AT,HIV,Hep C ab,Hep B cv ab , Hep B e ab,HbsAg, , - Normal/Negative. Hb 15 Plt 95, ferritin 188. Immune to Hep A.INR 1.2 ,Cr 0.86 Underwent cholecystectomy December 2021 07/01/2019: Right upper quadrant ultrasound cirrhosis with no hepatic masses 09/12/2019: Hemoglobin 13 g, MCV 95, platelets 63, creatinine 0.89, albumin 3.9, INR 1.2, total bilirubin 1.1 AFP 9.1  Interval history6/12/2019-03/28/2020  10/11/2019: MRI of liver with and without contrast demonstrates cirrhosis no focal suspicious lesions.  Small splenic lesions are stable likely splenic hemangiomata.  Concern for possible enlarging lymph nodes in the retroperitoneal area.  Repeat CT scan in 6 months recommended and was informed that he should have 1.  10/15/2019 CT scan of the abdomen pelvis with contrast demonstrates colonic diverticulosis stable hiatal hernia marked severity of splenomegaly  11/09/2019 seen Dr. Dahlia Byes for hiatal hernia repair.  Did well postop.  03/19/2020 seen Dr. Rogue Bussing for thrombocytopenia.  An ultrasound has also  been ordered for December.  The patient is in a low-salt diet.  Has had 2 shots of his Covid vaccine and is due for his booster.  No hepatic encephalopathy.  Did well after his hernia repair.  He has had labs recently performed by Dr. Rogue Bussing.  03/14/2020.  Meld score calculated 9.    Current Outpatient Medications  Medication Sig Dispense Refill  . Multiple Vitamin (MULTIVITAMIN WITH MINERALS) TABS tablet Take 1 tablet by mouth daily.     No current facility-administered medications for this visit.    Allergies as of 03/28/2020  . (No Known Allergies)    ROS:  General: Negative for anorexia, weight loss, fever, chills, fatigue, weakness. ENT: Negative for hoarseness, difficulty swallowing , nasal congestion. CV: Negative for chest pain, angina, palpitations, dyspnea on exertion, peripheral edema.  Respiratory: Negative for dyspnea at rest, dyspnea on exertion, cough, sputum, wheezing.  GI: See history of present illness. GU:  Negative for dysuria, hematuria, urinary incontinence, urinary frequency, nocturnal urination.  Endo: Negative for unusual weight change.    Physical Examination:   BP 134/84 (BP Location: Right Arm, Patient Position: Sitting, Cuff Size: Normal)   Pulse (!) 57   Ht 5' 5"  (1.651 m)   Wt 178 lb 9.6 oz (81 kg)   BMI 29.72 kg/m   General: Well-nourished, well-developed in no acute distress.  Eyes: No icterus. Conjunctivae pink. Mouth: Oropharyngeal mucosa moist and pink , no lesions erythema or exudate. Lungs: Clear to auscultation bilaterally. Non-labored. Heart: Regular rate and rhythm, no murmurs rubs or gallops.  Abdomen: Bowel sounds are normal, nontender, nondistended, no hepatosplenomegaly or masses, no abdominal bruits or hernia , no rebound or guarding.  Extremities: No lower extremity edema. No clubbing or deformities. Neuro: Alert and oriented x 3.  Grossly intact. Skin: Warm and dry, no jaundice.   Psych: Alert and cooperative, normal  mood and affect.   Imaging Studies: No results found.  Assessment and Plan:   Ryan Willis. is a 59 y.o. y/o male here to follow-up for liver cirrhosis secondary to NAFLD.  He is compensated and immune to hepatitis a and B.  No recent history of hepatic encephalopathy.  Meld score is 9 at his last office visit.  Plan 1.  Check labs to calculate meld score 2.  Recommend COVID-19 immunization/booster dose 3.  CT scan of the abdomen and pelvis in December 2021 to evaluate for retroperitoneal lymph nodes that was seen initially on MRI in June 2021 as well as to evaluate liver for any masses as part of screening for [facility].  Patient was scheduled for an ultrasound as well I have discussed with Dr. Rogue Bussing and the CT scan with contrast would be sufficient to review the liver as well as the retroperitoneal lymph nodes. 4.  Low-salt diet, avoid NSAID's 5.  EGD to screen for esophageal varices in 2022    Dr Ryan Bellows  MD,MRCP [facility]) Follow up in 6 months

## 2020-03-30 LAB — MICROSCOPIC EXAMINATION: Bacteria, UA: NONE SEEN

## 2020-03-30 LAB — URINALYSIS, COMPLETE
Bilirubin, UA: NEGATIVE
Glucose, UA: NEGATIVE
Ketones, UA: NEGATIVE
Leukocytes,UA: NEGATIVE
Nitrite, UA: NEGATIVE
Protein,UA: NEGATIVE
Specific Gravity, UA: >1.03 — ABNORMAL HIGH (ref 1.005–1.030)
Urobilinogen, Ur: 4 mg/dL — ABNORMAL HIGH (ref 0.2–1.0)
pH, UA: 6 (ref 5.0–7.5)

## 2020-04-10 ENCOUNTER — Ambulatory Visit: Payer: 59

## 2020-04-11 NOTE — Telephone Encounter (Signed)
open encounter in error 

## 2020-04-16 ENCOUNTER — Ambulatory Visit: Payer: 59

## 2020-04-23 ENCOUNTER — Ambulatory Visit
Admission: RE | Admit: 2020-04-23 | Discharge: 2020-04-23 | Disposition: A | Discharge status: Home / Self Care | Payer: 59 | Source: Ambulatory Visit | Attending: Gastroenterology | Admitting: Gastroenterology

## 2020-04-23 ENCOUNTER — Other Ambulatory Visit: Payer: Self-pay

## 2020-04-23 DIAGNOSIS — I8510 Secondary esophageal varices without bleeding: Secondary | ICD-10-CM | POA: Diagnosis not present

## 2020-04-23 DIAGNOSIS — K760 Fatty (change of) liver, not elsewhere classified: Secondary | ICD-10-CM | POA: Diagnosis not present

## 2020-04-23 DIAGNOSIS — R599 Enlarged lymph nodes, unspecified: Secondary | ICD-10-CM | POA: Insufficient documentation

## 2020-04-23 DIAGNOSIS — K7460 Unspecified cirrhosis of liver: Secondary | ICD-10-CM | POA: Diagnosis not present

## 2020-04-23 DIAGNOSIS — K449 Diaphragmatic hernia without obstruction or gangrene: Secondary | ICD-10-CM | POA: Diagnosis not present

## 2020-04-23 DIAGNOSIS — D7389 Other diseases of spleen: Secondary | ICD-10-CM | POA: Diagnosis not present

## 2020-04-23 LAB — POCT I-STAT CREATININE: Creatinine, Ser: 0.8 mg/dL (ref 0.61–1.24)

## 2020-04-23 MED ORDER — IOHEXOL 300 MG/ML  SOLN
100.0000 mL | Freq: Once | INTRAMUSCULAR | Status: AC | PRN
  Administered 2020-04-23: 100 mL via INTRAVENOUS

## 2020-05-14 ENCOUNTER — Encounter: Payer: Self-pay | Admitting: Gastroenterology

## 2020-05-16 ENCOUNTER — Other Ambulatory Visit: Payer: Self-pay

## 2020-05-16 ENCOUNTER — Telehealth: Payer: Self-pay

## 2020-05-16 DIAGNOSIS — I8500 Esophageal varices without bleeding: Secondary | ICD-10-CM

## 2020-05-16 MED ORDER — LACTULOSE 10 GM/15ML PO SOLN: 10.0000 g | Freq: Three times a day (TID) | ORAL | 0 refills | Status: DC

## 2020-05-16 NOTE — Telephone Encounter (Signed)
-----   Message from [name], MD sent at 05/14/2020 11:05 AM EST ----- Ryan Willis   Please inform patient that the spleen has not increased in size.  Features of constipation seen throughout the colon.  Large esophageal varices seen.  Recommend following 1 EGD to be scheduled over the next few weeks to screen for varices 2 since he suffers from cirrhosis, constipation can make his mental status clouded.  Suggest to commence on lactulose 15 cc 3 times daily to ensure he is having 2-3 soft bowel movements per day. 3.  Previously seen enlarged lymph nodes have not been commented upon would assume that they are not seen anymore  Dr [name] MD,MRCP [facility]) Gastroenterology/Hepatology Pager: [phone]

## 2020-05-16 NOTE — Telephone Encounter (Signed)
Patient verbalized understanding of results. Sent Lactulose to the pharmacy per Dr. Vicente Males orders. He made a EGD appointment for 05/31/2020. Went over instructions and sent instructions to [employer]

## 2020-05-29 ENCOUNTER — Other Ambulatory Visit: Payer: Self-pay

## 2020-05-29 ENCOUNTER — Other Ambulatory Visit
Admission: RE | Admit: 2020-05-29 | Discharge: 2020-05-29 | Disposition: A | Discharge status: Home / Self Care | Payer: 59 | Source: Ambulatory Visit | Attending: Gastroenterology | Admitting: Gastroenterology

## 2020-05-29 DIAGNOSIS — Z01812 Encounter for preprocedural laboratory examination: Secondary | ICD-10-CM | POA: Diagnosis not present

## 2020-05-29 DIAGNOSIS — Z20822 Contact with and (suspected) exposure to covid-19: Secondary | ICD-10-CM | POA: Diagnosis not present

## 2020-05-29 LAB — SARS CORONAVIRUS 2 (TAT 6-24 HRS): SARS Coronavirus 2: NEGATIVE

## 2020-05-31 ENCOUNTER — Encounter: Payer: Self-pay | Admitting: Gastroenterology

## 2020-05-31 ENCOUNTER — Ambulatory Visit: Payer: 59 | Admitting: Certified Registered"

## 2020-05-31 ENCOUNTER — Ambulatory Visit
Admission: RE | Admit: 2020-05-31 | Discharge: 2020-05-31 | Disposition: A | Discharge status: Home / Self Care | Payer: 59 | Attending: Gastroenterology | Admitting: Gastroenterology

## 2020-05-31 ENCOUNTER — Other Ambulatory Visit: Payer: Self-pay

## 2020-05-31 ENCOUNTER — Encounter
Admission: RE | Disposition: A | Discharge status: Home / Self Care | Payer: Self-pay | Source: Home / Self Care | Attending: Gastroenterology

## 2020-05-31 DIAGNOSIS — Z79899 Other long term (current) drug therapy: Secondary | ICD-10-CM | POA: Insufficient documentation

## 2020-05-31 DIAGNOSIS — I8500 Esophageal varices without bleeding: Secondary | ICD-10-CM

## 2020-05-31 DIAGNOSIS — I8510 Secondary esophageal varices without bleeding: Secondary | ICD-10-CM | POA: Insufficient documentation

## 2020-05-31 DIAGNOSIS — K7460 Unspecified cirrhosis of liver: Secondary | ICD-10-CM | POA: Diagnosis not present

## 2020-05-31 DIAGNOSIS — Z8051 Family history of malignant neoplasm of kidney: Secondary | ICD-10-CM | POA: Diagnosis not present

## 2020-05-31 DIAGNOSIS — K319 Disease of stomach and duodenum, unspecified: Secondary | ICD-10-CM | POA: Diagnosis not present

## 2020-05-31 DIAGNOSIS — Z800 Family history of malignant neoplasm of digestive organs: Secondary | ICD-10-CM | POA: Insufficient documentation

## 2020-05-31 DIAGNOSIS — Z801 Family history of malignant neoplasm of trachea, bronchus and lung: Secondary | ICD-10-CM | POA: Insufficient documentation

## 2020-05-31 DIAGNOSIS — D696 Thrombocytopenia, unspecified: Secondary | ICD-10-CM | POA: Diagnosis not present

## 2020-05-31 DIAGNOSIS — K810 Acute cholecystitis: Secondary | ICD-10-CM | POA: Diagnosis not present

## 2020-05-31 DIAGNOSIS — K2970 Gastritis, unspecified, without bleeding: Secondary | ICD-10-CM | POA: Diagnosis not present

## 2020-05-31 HISTORY — PX: ESOPHAGOGASTRODUODENOSCOPY (EGD) WITH PROPOFOL: SHX5813

## 2020-05-31 SURGERY — ESOPHAGOGASTRODUODENOSCOPY (EGD) WITH PROPOFOL
Anesthesia: General

## 2020-05-31 MED ORDER — SODIUM CHLORIDE 0.9 % IV SOLN: INTRAVENOUS | Status: DC

## 2020-05-31 MED ORDER — LIDOCAINE 2% (20 MG/ML) 5 ML SYRINGE
INTRAMUSCULAR | Status: DC | PRN
  Administered 2020-05-31: 25 mg via INTRAVENOUS

## 2020-05-31 MED ORDER — GLYCOPYRROLATE 0.2 MG/ML IJ SOLN
INTRAMUSCULAR | Status: DC | PRN
  Administered 2020-05-31: .2 mg via INTRAVENOUS

## 2020-05-31 MED ORDER — NADOLOL 20 MG PO TABS: 20.0000 mg | ORAL_TABLET | Freq: Every day | ORAL | 3 refills | Status: DC

## 2020-05-31 MED ORDER — PROPOFOL 10 MG/ML IV BOLUS
INTRAVENOUS | Status: DC | PRN
Start: 2020-05-31 — End: 2020-05-31
  Administered 2020-05-31: 30 mg via INTRAVENOUS
  Administered 2020-05-31: 70 mg via INTRAVENOUS

## 2020-05-31 MED ORDER — PROPOFOL 500 MG/50ML IV EMUL
INTRAVENOUS | Status: DC | PRN
  Administered 2020-05-31: 120 ug/kg/min via INTRAVENOUS

## 2020-05-31 NOTE — Op Note (Signed)
[facility] Gastroenterology Patient Name: Ryan Willis Procedure Date: 05/31/2020 10:31 AM MRN: [id number] Account #: [id number] Date of Birth: [birth year] Admit Type: Outpatient Age: 60 Room: [facility] ENDO ROOM 3 Gender: Male Note Status: Finalized Procedure:             Upper GI endoscopy Indications:           Cirrhosis rule out esophageal varices Providers:             [name] MD, MD Referring MD:          no local MD Medicines:             Monitored Anesthesia Care Complications:         No immediate complications. Procedure:             Pre-Anesthesia Assessment:                        - Prior to the procedure, a History and Physical was                         performed, and patient medications, allergies and                         sensitivities were reviewed. The patient's tolerance                         of previous anesthesia was reviewed.                        - The risks and benefits of the procedure and the                         sedation options and risks were discussed with the                         patient. All questions were answered and informed                         consent was obtained.                        - ASA Grade Assessment: III - A patient with severe                         systemic disease.                        After obtaining informed consent, the endoscope was                         passed under direct vision. Throughout the procedure,                         the patient's blood pressure, pulse, and oxygen                         saturations were monitored continuously. The Endoscope                         was introduced through the mouth, and  advanced to the                         third part of duodenum. The upper GI endoscopy was                         accomplished with ease. The patient tolerated the                         procedure well. Findings:      Grade I varices were found in the lower third of the esophagus.  They       were medium in size.      Localized moderately erythematous mucosa without bleeding was found in       the prepyloric region of the stomach. Biopsies were taken with a cold       forceps for histology.      The cardia and gastric fundus were normal on retroflexion.      The examined duodenum was normal. Impression:            - Grade I esophageal varices.                        - Erythematous mucosa in the prepyloric region of the                         stomach. Biopsied.                        - Normal examined duodenum. Recommendation:        - Discharge patient to home (with escort).                        - Resume previous diet.                        - Continue present medications.                        - Await pathology results.                        - Repeat upper endoscopy in 1 year for surveillance.                        - Commence on nadolol 20 mg once a day                        - Return to my office in 4 weeks. Procedure Code(s):     --- Professional ---                        [phone], Esophagogastroduodenoscopy, flexible,                         transoral; with biopsy, single or multiple Diagnosis Code(s):     --- Professional ---                        K74.60, Unspecified cirrhosis of liver  I85.10, Secondary esophageal varices without bleeding                        K31.89, Other diseases of stomach and duodenum CPT copyright 2019 American Medical Association. All rights reserved. The codes documented in this report are preliminary and upon coder review may  be revised to meet current compliance requirements. [name], MD [name] MD, MD 05/31/2020 10:44:14 AM This report has been signed electronically. Number of Addenda: 0 Note Initiated On: 05/31/2020 10:31 AM Estimated Blood Loss:  Estimated blood loss: none.      [facility]

## 2020-05-31 NOTE — Anesthesia Preprocedure Evaluation (Addendum)
Anesthesia Evaluation  Patient identified by MRN, date of birth, ID band Patient awake    Reviewed: Allergy & Precautions, H&P , NPO status , Patient's Chart, lab work & pertinent test results  History of Anesthesia Complications Negative for: history of anesthetic complications  Airway Mallampati: II  TM Distance: <3 FB Neck ROM: full    Dental  (+) Teeth Intact, Dental Advidsory Given, Caps   Pulmonary neg pulmonary ROS,    breath sounds clear to auscultation       Cardiovascular negative cardio ROS   Rhythm:regular Rate:Normal     Neuro/Psych negative neurological ROS  negative psych ROS   GI/Hepatic negative GI ROS, (+) Cirrhosis       ,   Endo/Other  negative endocrine ROS  Renal/GU Renal disease (kidney stones and a cyst on the left kidney)     Musculoskeletal   Abdominal   Peds  Hematology Thrombocytopenia.  Required 2 units of platelets for last surgery (cholecystectomy).  Receiving 1 unit of platelets pre-op with 2 units on hold for OR   Anesthesia Other Findings Past Medical History: No date: Cirrhosis of liver (HCC) No date: Diverticulitis No date: Family history of adverse reaction to anesthesia     Comment:  MOTHER HAD N/V No date: History of kidney stones No date: Hypercholesteremia No date: Hypothyroidism No date: Peyronie's disease No date: Renal cyst, left No date: Seasonal allergies No date: Thrombocytopenia ([place])  Past Surgical History: No date: CHOLECYSTECTOMY 2006: COLONOSCOPY 09/08/2017: COLONOSCOPY WITH PROPOFOL; N/A     Comment:  Procedure: COLONOSCOPY WITH PROPOFOL;  Surgeon: [name], MD;  Location: [facility] ENDOSCOPY;  Service:               Gastroenterology;  Laterality: N/A; 01/01/2018: COLONOSCOPY WITH PROPOFOL; N/A     Comment:  Procedure: COLONOSCOPY WITH PROPOFOL;  Surgeon: [name], MD;  Location: [facility] ENDOSCOPY;  Service:                Gastroenterology;  Laterality: N/A; 01/17/2019: CYSTOSCOPY/URETEROSCOPY/HOLMIUM LASER/STENT PLACEMENT;  Right     Comment:  Procedure: CYSTOSCOPY/URETEROSCOPY/STENT PLACEMENT;                Surgeon: [place] Espy, MD;  Location: [facility] ORS;                Service: Urology;  Laterality: Right; 01/31/2019: CYSTOSCOPY/URETEROSCOPY/HOLMIUM LASER/STENT PLACEMENT;  Right     Comment:  Procedure: BPZWCHENID/POEUMPNTIRWE/RXVQMGQ LASER/STENT               EXCHANGE;  Surgeon: [place]ice Espy, MD;  Location: [facility]               ORS;  Service: Urology;  Laterality: Right; 09/08/2017: ESOPHAGOGASTRODUODENOSCOPY (EGD) WITH PROPOFOL; N/A     Comment:  Procedure: ESOPHAGOGASTRODUODENOSCOPY (EGD) WITH               PROPOFOL;  Surgeon: [name], MD;  Location: [facility]               ENDOSCOPY;  Service: Gastroenterology;  Laterality: N/A; 1981: HERNIA REPAIR 04/19/2019: INTRAOPERATIVE CHOLANGIOGRAM; N/A     Comment:  Procedure: INTRAOPERATIVE CHOLANGIOGRAM;  Surgeon:               Jules Husbands, MD;  Location: [facility] ORS;  Service:  General;  Laterality: N/A; 2001: SHOULDER SURGERY; Right     Comment:  ROTATOR CUFF REPAIR  BMI    Body Mass Index: 27.44 kg/m      Reproductive/Obstetrics negative OB ROS                            Anesthesia Physical  Anesthesia Plan  ASA: III  Anesthesia Plan: General   Post-op Pain Management:    Induction: Intravenous  PONV Risk Score and Plan: TIVA and Propofol infusion  Airway Management Planned: Natural Airway and Nasal Cannula  Additional Equipment:   Intra-op Plan:   Post-operative Plan:   Informed Consent: I have reviewed the patients History and Physical, chart, labs and discussed the procedure including the risks, benefits and alternatives for the proposed anesthesia with the patient or authorized representative who has indicated his/her understanding and acceptance.     Dental Advisory Given  Plan  Discussed with: Anesthesiologist, CRNA and Surgeon  Anesthesia Plan Comments:         Anesthesia Quick Evaluation

## 2020-05-31 NOTE — Transfer of Care (Signed)
Immediate Anesthesia Transfer of Care Note  Patient: Ryan Willis.  Procedure(s) Performed: ESOPHAGOGASTRODUODENOSCOPY (EGD) WITH PROPOFOL (N/A )  Patient Location: Endoscopy Unit  Anesthesia Type:General  Level of Consciousness: drowsy  Airway & Oxygen Therapy: Patient Spontanous Breathing and Patient connected to nasal cannula oxygen  Post-op Assessment: Report given to RN and Post -op Vital signs reviewed and stable  Post vital signs: Reviewed  Last Vitals:  Vitals Value Taken Time  BP    Temp    Pulse 78 05/31/20 1045  Resp 12 05/31/20 1045  SpO2 91 % 05/31/20 1045  Vitals shown include unvalidated device data.  Last Pain:  Vitals:   05/31/20 0955  TempSrc: Temporal  PainSc: 0-No pain         Complications: No complications documented.

## 2020-05-31 NOTE — H&P (Signed)
Ryan Bellows, MD [address] [place], [place], [place], [address] [address], [place], [place], [place], [address] Phone: [phone]  Fax: [phone]  Primary Care Physician:  Patient, No Pcp Per   Pre-Procedure History & Physical: HPI:  Ryan Hassell. is a 60 y.o. male is here for an endoscopy    Past Medical History:  Diagnosis Date  . Cirrhosis of liver ([place])   . Diverticulitis   . Family history of adverse reaction to anesthesia    MOTHER HAD N/V  . History of kidney stones   . Hypercholesteremia   . Hypothyroidism   . Peyronie's disease   . Renal cyst, left   . Seasonal allergies   . Thrombocytopenia ([place])     Past Surgical History:  Procedure Laterality Date  . CHOLECYSTECTOMY    . COLONOSCOPY  2006  . COLONOSCOPY WITH PROPOFOL N/A 09/08/2017   Procedure: COLONOSCOPY WITH PROPOFOL;  Surgeon: Ryan Bellows, MD;  Location: [facility] ENDOSCOPY;  Service: Gastroenterology;  Laterality: N/A;  . COLONOSCOPY WITH PROPOFOL N/A 01/01/2018   Procedure: COLONOSCOPY WITH PROPOFOL;  Surgeon: Ryan Bellows, MD;  Location: [facility] ENDOSCOPY;  Service: Gastroenterology;  Laterality: N/A;  . CYSTOSCOPY/URETEROSCOPY/HOLMIUM LASER/STENT PLACEMENT Right 01/17/2019   Procedure: CYSTOSCOPY/URETEROSCOPY/STENT PLACEMENT;  Surgeon: [place] Espy, MD;  Location: [facility] ORS;  Service: Urology;  Laterality: Right;  . CYSTOSCOPY/URETEROSCOPY/HOLMIUM LASER/STENT PLACEMENT Right 01/31/2019   Procedure: CYSTOSCOPY/URETEROSCOPY/HOLMIUM LASER/STENT EXCHANGE;  Surgeon: Hollice Espy, MD;  Location: [facility] ORS;  Service: Urology;  Laterality: Right;  . ESOPHAGOGASTRODUODENOSCOPY (EGD) WITH PROPOFOL N/A 09/08/2017   Procedure: ESOPHAGOGASTRODUODENOSCOPY (EGD) WITH PROPOFOL;  Surgeon: Ryan Bellows, MD;  Location: [facility] ENDOSCOPY;  Service: Gastroenterology;  Laterality: N/A;  . HERNIA REPAIR  1981  . INTRAOPERATIVE CHOLANGIOGRAM N/A 04/19/2019   Procedure: INTRAOPERATIVE CHOLANGIOGRAM;  Surgeon: Jules Husbands, MD;  Location: [facility] ORS;  Service: General;  Laterality: N/A;  . SHOULDER SURGERY Right 2001   ROTATOR CUFF REPAIR  . VENTRAL HERNIA REPAIR N/A 10/27/2019   Procedure: HERNIA REPAIR VENTRAL ADULT, Open;  Surgeon: Jules Husbands, MD;  Location: [facility] ORS;  Service: General;  Laterality: N/A;    Prior to Admission medications   Medication Sig Start Date End Date Taking? Authorizing Provider  lactulose (CHRONULAC) 10 GM/15ML solution Take 15 mLs (10 g total) by mouth 3 (three) times daily. 05/16/20  Yes Ryan Bellows, MD  Multiple Vitamin (MULTIVITAMIN WITH MINERALS) TABS tablet Take 1 tablet by mouth daily.   Yes [provider]    Allergies as of 05/16/2020  . (No Known Allergies)    Family History  Problem Relation Age of Onset  . Kidney cancer Mother   . Lung cancer Mother   . Colon cancer Father     Social History   Socioeconomic History  . Marital status: Married    Spouse name: Not on file  . Number of children: Not on file  . Years of education: Not on file  . Highest education level: Not on file  Occupational History  . Not on file  Tobacco Use  . Smoking status: Never Smoker  . Smokeless tobacco: Never Used  Vaping Use  . Vaping Use: Never used  Substance and Sexual Activity  . Alcohol use: Not Currently    Comment: "1 or 2 year drinks a year."  . Drug use: No  . Sexual activity: Not on file  Other Topics Concern  . Not on file  Social History Narrative   retd. Engineer, structural; part  time information specialist; never smoked; no alcohol; live in [place]. Lives with wife.    Social Determinants of Health   Financial Resource Strain: Not on file  Food Insecurity: Not on file  Transportation Needs: Not on file  Physical Activity: Not on file  Stress: Not on file  Social Connections: Not on file  Intimate Partner Violence: Not on file    Review of Systems: See HPI, otherwise negative ROS  Physical Exam: BP 113/69   Pulse 67   Temp (!) 97.5 F  (36.4 C) (Temporal)   Resp 16   Ht 5\' 6"  (1.676 m)   Wt 79.4 kg   SpO2 99%   BMI 28.25 kg/m  General:   Alert,  pleasant and cooperative in NAD Head:  Normocephalic and atraumatic. Neck:  Supple; no masses or thyromegaly. Lungs:  Clear throughout to auscultation, normal respiratory effort.    Heart:  +S1, +S2, Regular rate and rhythm, No edema. Abdomen:  Soft, nontender and nondistended. Normal bowel sounds, without guarding, and without rebound.   Neurologic:  Alert and  oriented x4;  grossly normal neurologically.  Impression/Plan: Ryan Willis. is here for an endoscopy  to be performed for  evaluation of esophageal varices    Risks, benefits, limitations, and alternatives regarding endoscopy have been reviewed with the patient.  Questions have been answered.  All parties agreeable.   Ryan Bellows, MD  05/31/2020, 10:27 AM

## 2020-06-01 ENCOUNTER — Encounter: Payer: Self-pay | Admitting: Gastroenterology

## 2020-06-01 NOTE — Anesthesia Postprocedure Evaluation (Signed)
Anesthesia Post Note  Patient: Ryan Willis.  Procedure(s) Performed: ESOPHAGOGASTRODUODENOSCOPY (EGD) WITH PROPOFOL (N/A )  Patient location during evaluation: Endoscopy Anesthesia Type: General Level of consciousness: awake and alert Pain management: pain level controlled Vital Signs Assessment: post-procedure vital signs reviewed and stable Respiratory status: spontaneous breathing, nonlabored ventilation, respiratory function stable and patient connected to nasal cannula oxygen Cardiovascular status: blood pressure returned to baseline and stable Postop Assessment: no apparent nausea or vomiting Anesthetic complications: no   No complications documented.   Last Vitals:  Vitals:   05/31/20 0955 05/31/20 1045  BP: 113/69 110/73  Pulse: 67   Resp: 16   Temp: (!) 36.4 C 36.5 C  SpO2: 99% 92%    Last Pain:  Vitals:   06/01/20 0728  TempSrc:   PainSc: 0-No pain                 Martha Clan

## 2020-06-04 LAB — SURGICAL PATHOLOGY

## 2020-06-05 ENCOUNTER — Encounter: Payer: Self-pay | Admitting: Gastroenterology

## 2020-06-25 ENCOUNTER — Other Ambulatory Visit: Payer: Self-pay

## 2020-06-25 ENCOUNTER — Ambulatory Visit (INDEPENDENT_AMBULATORY_CARE_PROVIDER_SITE_OTHER): Payer: 59 | Admitting: Gastroenterology

## 2020-06-25 VITALS — BP 142/83 | HR 60 | Temp 97.8°F | Ht 65.0 in | Wt 180.0 lb

## 2020-06-25 DIAGNOSIS — K7460 Unspecified cirrhosis of liver: Secondary | ICD-10-CM

## 2020-06-25 DIAGNOSIS — I8500 Esophageal varices without bleeding: Secondary | ICD-10-CM

## 2020-06-25 NOTE — Progress Notes (Signed)
Ryan Bellows MD, MRCP(U.K) [address]  [place]  [place], [place] [address]  Main: [phone]  Fax: [phone]   Primary Care Physician: Patient, No Pcp Per  Primary Gastroenterologist:  Dr. Jonathon Willis   Follow up after EGD/procedures  HPI: Ryan Okey. is a 60 y.o. male   Summary of history :  H/oliver cirrhosis secondary to NAFLD  CT scan on 07/15/17 which showed features of cirrhosis and portal hypertension withwith splenomegaly, a recanalized left paraumbilical vein and small distal esophageal varices.His dad had colon cancer..   01/01/18 : colonoscopy- 3 mm tubular adenoma resected Underwent cholecystectomy December 2021 03/19/2020 seen Dr. Rogue Bussing for thrombocytopenia.   Interval history12/04/2019-06/25/2020  04/23/2020: CT abdomen and pelvis: stable features of cirrhosis , moderate stool burden .  05/31/2020: EGD: Grade 1 esophageal varices. Commenced on 20 mg Nadolol.  03/14/2020.  Meld score calculated 9.   He is doing well.  No complaints.  Denies any use of NSAIDs.  Has a bowel movement once every other day.  He does have lactulose at home but does not take it.  Discussed about taking it.  Denies any mental status changes.  Taking nadolol daily.  Takes it at night. Current Outpatient Medications  Medication Sig Dispense Refill  . lactulose (CHRONULAC) 10 GM/15ML solution Take 15 mLs (10 g total) by mouth 3 (three) times daily. 236 mL 0  . Multiple Vitamin (MULTIVITAMIN WITH MINERALS) TABS tablet Take 1 tablet by mouth daily.    . nadolol (CORGARD) 20 MG tablet Take 1 tablet (20 mg total) by mouth daily. 90 tablet 3   No current facility-administered medications for this visit.    Allergies as of 06/25/2020  . (No Known Allergies)    ROS:  General: Negative for anorexia, weight loss, fever, chills, fatigue, weakness. ENT: Negative for hoarseness, difficulty swallowing , nasal congestion. CV: Negative for chest pain, angina,  palpitations, dyspnea on exertion, peripheral edema.  Respiratory: Negative for dyspnea at rest, dyspnea on exertion, cough, sputum, wheezing.  GI: See history of present illness. GU:  Negative for dysuria, hematuria, urinary incontinence, urinary frequency, nocturnal urination.  Endo: Negative for unusual weight change.    Physical Examination:   BP (!) 142/83   Pulse 60   Temp 97.8 F (36.6 C)   Ht 5' 5"  (1.651 m)   Wt 180 lb (81.6 kg)   BMI 29.95 kg/m   General: Well-nourished, well-developed in no acute distress.  Eyes: No icterus. Conjunctivae pink. Mouth: Oropharyngeal mucosa moist and pink , no lesions erythema or exudate. Lungs: Clear to auscultation bilaterally. Non-labored. Heart: Regular rate and rhythm, no murmurs rubs or gallops.  Abdomen: Bowel sounds are normal, nontender, nondistended, no hepatosplenomegaly or masses, no abdominal bruits or hernia , no rebound or guarding.   Extremities: No lower extremity edema. No clubbing or deformities. Neuro: Alert and oriented x 3.  Grossly intact. Skin: Warm and dry, no jaundice.   Psych: Alert and cooperative, normal mood and affect.   Imaging Studies: No results found.  Assessment and Plan:   Ryan Stief. is a 60 y.o. y/o male here to follow-up for liver cirrhosis, non bleeding esophageal varices secondary to NAFLD.  He is compensated and immune to hepatitis a and B.  No recent history of hepatic encephalopathy.  Meld score is 9 in 02/2020  Plan 1.  Check labs to calculate meld score 2.  Recommend COVID-19 immunization/booster dose which he is due and has not taken yet 3.  Low-salt diet, avoid NSAID's 4.  RUS USG to screen for varices in 09/2020 5.  EGD to screen for esophageal varices in  06/2021 6.  Lactulose for constipation: Discussed to take 1 teaspoon twice a day as needed to ensure he has 1-2 soft bowel movements per day. 7.  Nadolol 20 mg a day continue as his heart rate is 60 bpm.   Dr Ryan Bellows   MD,MRCP [facility]) Follow up in 5 months

## 2020-06-26 ENCOUNTER — Encounter: Payer: Self-pay | Admitting: Gastroenterology

## 2020-06-26 LAB — CBC WITH DIFFERENTIAL/PLATELET
Basophils Absolute: 0 10*3/uL (ref 0.0–0.2)
Basos: 1 %
EOS (ABSOLUTE): 0.1 10*3/uL (ref 0.0–0.4)
Eos: 4 %
Hematocrit: 38 % (ref 37.5–51.0)
Hemoglobin: 13.5 g/dL (ref 13.0–17.7)
Immature Grans (Abs): 0 10*3/uL (ref 0.0–0.1)
Immature Granulocytes: 0 %
Lymphocytes Absolute: 0.7 10*3/uL (ref 0.7–3.1)
Lymphs: 26 %
MCH: 33.6 pg — ABNORMAL HIGH (ref 26.6–33.0)
MCHC: 35.5 g/dL (ref 31.5–35.7)
MCV: 95 fL (ref 79–97)
Monocytes Absolute: 0.3 10*3/uL (ref 0.1–0.9)
Monocytes: 9 %
Neutrophils Absolute: 1.6 10*3/uL (ref 1.4–7.0)
Neutrophils: 60 %
Platelets: 63 10*3/uL — CL (ref 150–450)
RBC: 4.02 x10E6/uL — ABNORMAL LOW (ref 4.14–5.80)
RDW: 13.1 % (ref 11.6–15.4)
WBC: 2.8 10*3/uL — ABNORMAL LOW (ref 3.4–10.8)

## 2020-06-26 LAB — COMPREHENSIVE METABOLIC PANEL
ALT: 21 IU/L (ref 0–44)
AST: 34 IU/L (ref 0–40)
Albumin/Globulin Ratio: 1.6 (ref 1.2–2.2)
Albumin: 4 g/dL (ref 3.8–4.9)
Alkaline Phosphatase: 81 IU/L (ref 44–121)
BUN/Creatinine Ratio: 15 (ref 10–24)
BUN: 12 mg/dL (ref 8–27)
Bilirubin Total: 1.4 mg/dL — ABNORMAL HIGH (ref 0.0–1.2)
CO2: 23 mmol/L (ref 20–29)
Calcium: 9.3 mg/dL (ref 8.6–10.2)
Chloride: 107 mmol/L — ABNORMAL HIGH (ref 96–106)
Creatinine, Ser: 0.8 mg/dL (ref 0.76–1.27)
Globulin, Total: 2.5 g/dL (ref 1.5–4.5)
Glucose: 74 mg/dL (ref 65–99)
Potassium: 4.4 mmol/L (ref 3.5–5.2)
Sodium: 143 mmol/L (ref 134–144)
Total Protein: 6.5 g/dL (ref 6.0–8.5)
eGFR: 101 mL/min/{1.73_m2} (ref >59)

## 2020-06-26 LAB — PROTIME-INR
INR: 1.2 (ref 0.9–1.2)
Prothrombin Time: 12.5 s — ABNORMAL HIGH (ref 9.1–12.0)

## 2020-07-14 ENCOUNTER — Emergency Department
Admission: EM | Admit: 2020-07-14 | Discharge: 2020-07-15 | Disposition: A | Discharge status: Home / Self Care | Payer: 59 | Attending: Emergency Medicine | Admitting: Emergency Medicine

## 2020-07-14 ENCOUNTER — Other Ambulatory Visit: Payer: Self-pay

## 2020-07-14 ENCOUNTER — Emergency Department: Payer: 59

## 2020-07-14 DIAGNOSIS — K7460 Unspecified cirrhosis of liver: Secondary | ICD-10-CM | POA: Insufficient documentation

## 2020-07-14 DIAGNOSIS — D696 Thrombocytopenia, unspecified: Secondary | ICD-10-CM | POA: Diagnosis not present

## 2020-07-14 DIAGNOSIS — D72819 Decreased white blood cell count, unspecified: Secondary | ICD-10-CM | POA: Insufficient documentation

## 2020-07-14 DIAGNOSIS — N201 Calculus of ureter: Secondary | ICD-10-CM | POA: Diagnosis not present

## 2020-07-14 DIAGNOSIS — I8500 Esophageal varices without bleeding: Secondary | ICD-10-CM | POA: Diagnosis not present

## 2020-07-14 DIAGNOSIS — Z9049 Acquired absence of other specified parts of digestive tract: Secondary | ICD-10-CM | POA: Diagnosis not present

## 2020-07-14 DIAGNOSIS — E039 Hypothyroidism, unspecified: Secondary | ICD-10-CM | POA: Diagnosis not present

## 2020-07-14 DIAGNOSIS — K5730 Diverticulosis of large intestine without perforation or abscess without bleeding: Secondary | ICD-10-CM | POA: Insufficient documentation

## 2020-07-14 DIAGNOSIS — K766 Portal hypertension: Secondary | ICD-10-CM | POA: Insufficient documentation

## 2020-07-14 DIAGNOSIS — N132 Hydronephrosis with renal and ureteral calculous obstruction: Secondary | ICD-10-CM | POA: Insufficient documentation

## 2020-07-14 DIAGNOSIS — R1032 Left lower quadrant pain: Secondary | ICD-10-CM | POA: Diagnosis present

## 2020-07-14 LAB — CBC
HCT: 37.3 % — ABNORMAL LOW (ref 39.0–52.0)
Hemoglobin: 12.9 g/dL — ABNORMAL LOW (ref 13.0–17.0)
MCH: 33.7 pg (ref 26.0–34.0)
MCHC: 34.6 g/dL (ref 30.0–36.0)
MCV: 97.4 fL (ref 80.0–100.0)
Platelets: 61 10*3/uL — ABNORMAL LOW (ref 150–400)
RBC: 3.83 MIL/uL — ABNORMAL LOW (ref 4.22–5.81)
RDW: 13.7 % (ref 11.5–15.5)
WBC: 2.7 10*3/uL — ABNORMAL LOW (ref 4.0–10.5)
nRBC: 0 % (ref 0.0–0.2)

## 2020-07-14 LAB — HEPATIC FUNCTION PANEL
ALT: 23 U/L (ref 0–44)
AST: 37 U/L (ref 15–41)
Albumin: 3.8 g/dL (ref 3.5–5.0)
Alkaline Phosphatase: 67 U/L (ref 38–126)
Bilirubin, Direct: 0.3 mg/dL — ABNORMAL HIGH (ref 0.0–0.2)
Indirect Bilirubin: 1.2 mg/dL — ABNORMAL HIGH (ref 0.3–0.9)
Total Bilirubin: 1.5 mg/dL — ABNORMAL HIGH (ref 0.3–1.2)
Total Protein: 6.4 g/dL — ABNORMAL LOW (ref 6.5–8.1)

## 2020-07-14 LAB — BASIC METABOLIC PANEL
Anion gap: 6 (ref 5–15)
BUN: 19 mg/dL (ref 6–20)
CO2: 22 mmol/L (ref 22–32)
Calcium: 9.1 mg/dL (ref 8.9–10.3)
Chloride: 112 mmol/L — ABNORMAL HIGH (ref 98–111)
Creatinine, Ser: 1.03 mg/dL (ref 0.61–1.24)
GFR, Estimated: >60 mL/min (ref >60)
Glucose, Bld: 144 mg/dL — ABNORMAL HIGH (ref 70–99)
Potassium: 4.6 mmol/L (ref 3.5–5.1)
Sodium: 140 mmol/L (ref 135–145)

## 2020-07-14 LAB — URINALYSIS, COMPLETE (UACMP) WITH MICROSCOPIC
Bacteria, UA: NONE SEEN
Bilirubin Urine: NEGATIVE
Glucose, UA: NEGATIVE mg/dL
Ketones, ur: NEGATIVE mg/dL
Leukocytes,Ua: NEGATIVE
Nitrite: NEGATIVE
Protein, ur: NEGATIVE mg/dL
RBC / HPF: >50 RBC/hpf — ABNORMAL HIGH (ref 0–5)
Specific Gravity, Urine: 1.018 (ref 1.005–1.030)
Squamous Epithelial / HPF: NONE SEEN (ref 0–5)
pH: 8 (ref 5.0–8.0)

## 2020-07-14 LAB — LIPASE, BLOOD: Lipase: 39 U/L (ref 11–51)

## 2020-07-14 MED ORDER — ONDANSETRON HCL 4 MG/2ML IJ SOLN
4.0000 mg | Freq: Once | INTRAMUSCULAR | Status: AC
  Administered 2020-07-14: 4 mg via INTRAVENOUS
  Filled 2020-07-14: qty 2

## 2020-07-14 MED ORDER — HYDROMORPHONE HCL 1 MG/ML IJ SOLN
0.5000 mg | Freq: Once | INTRAMUSCULAR | Status: AC
  Administered 2020-07-15: 0.5 mg via INTRAVENOUS
  Filled 2020-07-14: qty 1

## 2020-07-14 MED ORDER — SODIUM CHLORIDE 0.9 % IV BOLUS
500.0000 mL | Freq: Once | INTRAVENOUS | Status: AC
  Administered 2020-07-14: 500 mL via INTRAVENOUS

## 2020-07-14 MED ORDER — HYDROMORPHONE HCL 1 MG/ML IJ SOLN
0.5000 mg | Freq: Once | INTRAMUSCULAR | Status: AC
  Administered 2020-07-14: 0.5 mg via INTRAVENOUS
  Filled 2020-07-14: qty 1

## 2020-07-14 NOTE — ED Provider Notes (Signed)
[facility] Emergency Department Provider Note  ____________________________________________   Event Date/Time   First MD Initiated Contact with Patient 07/14/20 2200     (approximate)  I have reviewed the triage vital signs and the nursing notes.   HISTORY  Chief Complaint Flank Pain    HPI Ryan Muns. is a 60 y.o. male with cirrhosis of the liver, kidney stones who comes in with concern for repeat kidney stone.  Patient states around 530 had sudden onset of severe pain in his left lower quadrant has been constant, nothing makes it better, it makes it worse.  He reports some nausea but no vomiting.  He states that he took some laxatives to see if this for constipation had a bowel movement but still continued having the pain.  No chest pain, shortness of breath.  Reports having history of liver issues but denies any right upper quadrant tenderness.  Denies any swelling of his testicles.          Past Medical History:  Diagnosis Date  . Cirrhosis of liver ([place])   . Diverticulitis   . Family history of adverse reaction to anesthesia    MOTHER HAD N/V  . History of kidney stones   . Hypercholesteremia   . Hypothyroidism   . Peyronie's disease   . Renal cyst, left   . Seasonal allergies   . Thrombocytopenia [facility])     Patient Active Problem List   Diagnosis Date Noted  . S/P cholecystectomy 04/26/2019  . Cholecystitis 04/19/2019  . Acute cholecystitis 01/25/2019  . Splenomegaly 07/07/2017  . Thrombocytopenia ([place]) 06/23/2017  . Cirrhosis ([place]) 06/23/2017    Past Surgical History:  Procedure Laterality Date  . CHOLECYSTECTOMY    . COLONOSCOPY  2006  . COLONOSCOPY WITH PROPOFOL N/A 09/08/2017   Procedure: COLONOSCOPY WITH PROPOFOL;  Surgeon: [name], MD;  Location: [facility] ENDOSCOPY;  Service: Gastroenterology;  Laterality: N/A;  . COLONOSCOPY WITH PROPOFOL N/A 01/01/2018   Procedure: COLONOSCOPY WITH PROPOFOL;  Surgeon: [name], MD;   Location: [facility] ENDOSCOPY;  Service: Gastroenterology;  Laterality: N/A;  . CYSTOSCOPY/URETEROSCOPY/HOLMIUM LASER/STENT PLACEMENT Right 01/17/2019   Procedure: CYSTOSCOPY/URETEROSCOPY/STENT PLACEMENT;  Surgeon: [place] Espy, MD;  Location: [facility] ORS;  Service: Urology;  Laterality: Right;  . CYSTOSCOPY/URETEROSCOPY/HOLMIUM LASER/STENT PLACEMENT Right 01/31/2019   Procedure: CYSTOSCOPY/URETEROSCOPY/HOLMIUM LASER/STENT EXCHANGE;  Surgeon: [place]ice Espy, MD;  Location: [facility] ORS;  Service: Urology;  Laterality: Right;  . ESOPHAGOGASTRODUODENOSCOPY (EGD) WITH PROPOFOL N/A 09/08/2017   Procedure: ESOPHAGOGASTRODUODENOSCOPY (EGD) WITH PROPOFOL;  Surgeon: [name], MD;  Location: [facility] ENDOSCOPY;  Service: Gastroenterology;  Laterality: N/A;  . ESOPHAGOGASTRODUODENOSCOPY (EGD) WITH PROPOFOL N/A 05/31/2020   Procedure: ESOPHAGOGASTRODUODENOSCOPY (EGD) WITH PROPOFOL;  Surgeon: [name], MD;  Location: [facility] ENDOSCOPY;  Service: Gastroenterology;  Laterality: N/A;  . HERNIA REPAIR  1981  . INTRAOPERATIVE CHOLANGIOGRAM N/A 04/19/2019   Procedure: INTRAOPERATIVE CHOLANGIOGRAM;  Surgeon: Jules Husbands, MD;  Location: [facility] ORS;  Service: General;  Laterality: N/A;  . SHOULDER SURGERY Right 2001   ROTATOR CUFF REPAIR  . VENTRAL HERNIA REPAIR N/A 10/27/2019   Procedure: HERNIA REPAIR VENTRAL ADULT, Open;  Surgeon: Jules Husbands, MD;  Location: [facility] ORS;  Service: General;  Laterality: N/A;    Prior to Admission medications   Medication Sig Start Date End Date Taking? Authorizing Provider  lactulose (CHRONULAC) 10 GM/15ML solution Take 15 mLs (10 g total) by mouth 3 (three) times daily. 05/16/20   [name], MD  Multiple Vitamin (MULTIVITAMIN WITH MINERALS) TABS tablet Take  1 tablet by mouth daily.    [provider]  nadolol (CORGARD) 20 MG tablet Take 1 tablet (20 mg total) by mouth daily. 05/31/20 05/31/21  [name], MD    Allergies Patient has no known allergies.  Family History  Problem  Relation Age of Onset  . Kidney cancer Mother   . Lung cancer Mother   . Colon cancer Father     Social History Social History   Tobacco Use  . Smoking status: Never Smoker  . Smokeless tobacco: Never Used  Vaping Use  . Vaping Use: Never used  Substance Use Topics  . Alcohol use: Not Currently    Comment: "1 or 2 year drinks a year."  . Drug use: No      Review of Systems Constitutional: No fever/chills Eyes: No visual changes. ENT: No sore throat. Cardiovascular: Denies chest pain. Respiratory: Denies shortness of breath. Gastrointestinal: Left lower abdominal pain no nausea, no vomiting.  No diarrhea.  No constipation. Genitourinary: Negative for dysuria. Musculoskeletal: Left flank pain Skin: Negative for rash. Neurological: Negative for headaches, focal weakness or numbness. All other ROS negative ____________________________________________   PHYSICAL EXAM:  VITAL SIGNS: ED Triage Vitals  Enc Vitals Group     BP 07/14/20 2107 (!) 155/80     Pulse Rate 07/14/20 2107 65     Resp 07/14/20 2107 20     Temp 07/14/20 2107 97.9 F (36.6 C)     Temp src --      SpO2 07/14/20 2107 94 %     Weight 07/14/20 2108 175 lb (79.4 kg)     Height 07/14/20 2108 5' 6"  (1.676 m)     Head Circumference --      Peak Flow --      Pain Score 07/14/20 2108 8     Pain Loc --      Pain Edu? --      Excl. in [place]? --     Constitutional: Alert and oriented. Well appearing and in no acute distress. Eyes: Conjunctivae are normal. EOMI. Head: Atraumatic. Nose: No congestion/rhinnorhea. Mouth/Throat: Mucous membranes are moist.   Neck: No stridor. Trachea Midline. FROM Cardiovascular: Normal rate, regular rhythm. Grossly normal heart sounds.  Good peripheral circulation. Respiratory: Normal respiratory effort.  No retractions. Lungs CTAB. Gastrointestinal: Soft with some left lower quadrant tenderness. No distention. No abdominal bruits.  Musculoskeletal: No lower extremity  tenderness nor edema.  No joint effusions. Neurologic:  Normal speech and language. No gross focal neurologic deficits are appreciated.  Skin:  Skin is warm, dry and intact. No rash noted. Psychiatric: Mood and affect are normal. Speech and behavior are normal. GU: Deferred   ____________________________________________   LABS (all labs ordered are listed, but only abnormal results are displayed)  Labs Reviewed  URINALYSIS, COMPLETE (UACMP) WITH MICROSCOPIC - Abnormal; Notable for the following components:      Result Value   Color, Urine YELLOW (*)    APPearance CLOUDY (*)    Hgb urine dipstick LARGE (*)    RBC / HPF >50 (*)    All other components within normal limits  CBC - Abnormal; Notable for the following components:   WBC 2.7 (*)    RBC 3.83 (*)    Hemoglobin 12.9 (*)    HCT 37.3 (*)    Platelets 61 (*)    All other components within normal limits  BASIC METABOLIC PANEL - Abnormal; Notable for the following components:   Chloride 112 (*)  Glucose, Bld 144 (*)    All other components within normal limits  HEPATIC FUNCTION PANEL - Abnormal; Notable for the following components:   Total Protein 6.4 (*)    Total Bilirubin 1.5 (*)    Bilirubin, Direct 0.3 (*)    Indirect Bilirubin 1.2 (*)    All other components within normal limits  LIPASE, BLOOD   ____________________________________________  RADIOLOGY Robert Bellow, personally viewed and evaluated these images (plain radiographs) as part of my medical decision making, as well as reviewing the written report by the radiologist.  ED MD interpretation:  Pending   Official radiology report(s): No results found.  ____________________________________________   PROCEDURES  Procedure(s) performed (including Critical Care):  Procedures   ____________________________________________   INITIAL IMPRESSION / ASSESSMENT AND PLAN / ED COURSE  Ryan Moore. was evaluated in Emergency Department on 07/14/2020  for the symptoms described in the history of present illness. He was evaluated in the context of the global COVID-19 pandemic, which necessitated consideration that the patient might be at risk for infection with the SARS-CoV-2 virus that causes COVID-19. Institutional protocols and algorithms that pertain to the evaluation of patients at risk for COVID-19 are in a state of rapid change based on information released by regulatory bodies including the CDC and federal and state organizations. These policies and algorithms were followed during the patient's care in the ED.    Patient is a 60 year old who comes in with left leg pain in the setting of history of kidney stone.  Will get labs evaluate for electrolyte , AKI.  Will get urine to evaluate for UTI will get CT renal to evaluate for kidney stone.  Will hold off on Toradol due to history of cirrhosis.  Will give IV Dilaudid and Zofran  White count is stably low.  Platelets are stably low  UA no evidence of infection and patient is afebrile   Patient handed off pending CT imaging      ____________________________________________   FINAL CLINICAL IMPRESSION(S) / ED DIAGNOSES   Final diagnoses:  None      MEDICATIONS GIVEN DURING THIS VISIT:  Medications - No data to display   ED Discharge Orders    None       Note:  This document was prepared using Dragon voice recognition software and may include unintentional dictation errors.   Vanessa San Jon, MD 07/14/20 [phone]

## 2020-07-14 NOTE — ED Provider Notes (Signed)
----------------------------------------- 11:02 PM on 07/14/2020 -----------------------------------------  Assuming care from Dr. Jari Pigg.  In short, Ryan Willis. is a 60 y.o. male with a chief complaint of flank pain.  Refer to the original H&P for additional details.  The current plan of care is to follow-up on CT scan and reassess.   ----------------------------------------- 1:06 AM on 07/15/2020 -----------------------------------------  CT Renal Stone Study  Result Date: 07/14/2020 CLINICAL DATA:  Flank pain.  Kidney stone suspected. EXAM: CT ABDOMEN AND PELVIS WITHOUT CONTRAST TECHNIQUE: Multidetector CT imaging of the abdomen and pelvis was performed following the standard protocol without IV contrast. COMPARISON:  CT abdomen pelvis 04/23/2020, CT abdomen pelvis 07/15/2017, MRI abdomen reflux is 321 FINDINGS: Lower chest: Marked paraesophageal varices. Trace free fluid along the esophagus. Similar subpleural nodularity within the bilateral lower lobes. Left anterior descending coronary artery calcifications. Hepatobiliary: Nodular hepatic contour with enlargement of the caudate lobe and left hepatic lobe. No focal liver abnormality is seen. Status post cholecystectomy. No biliary dilatation. Pancreas: No focal lesion. Normal pancreatic contour. No surrounding inflammatory changes. No main pancreatic ductal dilatation. Spleen: The spleen is enlarged measuring up to 17.5cm. No focal splenic lesion. Adrenals/Urinary Tract: No adrenal nodule bilaterally. Left nephrolithiasis measuring up to 6 mm. There is a 5 mm calcified stone at the left ureteropelvic junction with associated proximal mild hydronephrosis. Also associated is left perinephric stranding. No right nephrolithiasis, no right hydronephrosis. There is in similar appearing exophytic fluid density lesion measuring up to 6.2 cm along the inferior pole of the left kidney that likely represents a simple renal cyst. No bilateral  contour-deforming renal mass. No ureterolithiasis or hydroureter. The urinary bladder is unremarkable. Stomach/Bowel: Layering hyperdensity within the gastric lumen likely related to administered medication. Stomach is within normal limits. No evidence of bowel wall thickening or dilatation. Diffuse descending and sigmoid diverticulosis. No pneumatosis. Under distended rectosigmoid colon. Appendix appears normal. Vascular/Lymphatic: Venous collaterals. Stable in size 2.3 x 1.2cm hazy density surrounding origin of the inferior mesenteric artery (2: 46-45). No abdominal aorta or iliac aneurysm. Mild atherosclerotic plaque of the aorta and its branches. No abdominal, pelvic, or inguinal lymphadenopathy. Reproductive: The prostate is enlarged measuring up to 5 cm. Other: No intraperitoneal free fluid. No intraperitoneal free gas. No organized fluid collection. Musculoskeletal: No abdominal wall hernia or abnormality. No suspicious lytic or blastic osseous lesions. No acute displaced fracture. IMPRESSION: 1. Obstructive 5 mm left ureteropelvic junction stone. Correlate with urinalysis to evaluate for superimposed infection. 2. Nonobstructive left nephrolithiasis measuring up to 6 mm. 3. Indeterminate stable in size 2.3 x 1.2cm hazy density surrounding origin of the inferior mesenteric artery. Recommend attention on follow-up. 4. Cirrhosis with portal hypertension. Markedly limited evaluation for focal hepatic lesion on this noncontrast study. Consider MRI liver protocol for further evaluation if clinically. 5. Marked paraesophageal varices. 6. Diffuse descending and sigmoid diverticulosis with no CT findings of acute diverticulitis. 7. Prostatomegaly. 8.  Aortic Atherosclerosis (ICD10-I70.0). Electronically Signed   By: Iven Finn M.D.   On: 07/14/2020 23:45     Patient has a 5 mm obstructive left UPJ stone.  There was a question of some perinephric stranding but the patient's urinalysis is reassuring with no  suggestion of infection and the patient reports no fever or other signs or symptoms of infection.  His heart rate has been in the 60s and he is afebrile.  I ordered a urine culture but will not treat empirically with antibiotics.  The patient's pain has improved substantially after analgesia in  the ED.  He is comfortable with the plan for oral medication and discharge home for close outpatient follow-up.  He has had kidney stones in the past and is familiar with the process of lithotripsy.  I have given him information about how to contact Dr. Erlene Quan and I gave my usual and customary return precautions if his symptoms get worse or if he develops new symptoms that concern him.   Hinda Kehr, MD 07/15/20 [phone]

## 2020-07-14 NOTE — ED Triage Notes (Signed)
Pt states "I think I have a kidney stone, I have had them in the past and it feels very similar." Pt states left sided flank pain. Pt states pian started at 5:15pm today. Pt states history of low platelets and cirrhosis of the liver

## 2020-07-15 MED ORDER — OXYCODONE HCL 5 MG PO TABS
10.0000 mg | ORAL_TABLET | ORAL | Status: AC
  Administered 2020-07-15: 10 mg via ORAL
  Filled 2020-07-15: qty 2

## 2020-07-15 MED ORDER — OXYCODONE HCL 5 MG PO TABS: ORAL_TABLET | ORAL | 0 refills | Status: DC

## 2020-07-15 NOTE — Discharge Instructions (Signed)
You have been seen in the Emergency Department (ED) today for pain caused by kidney stones.  As we have discussed, please drink plenty of fluids.  Please make a follow up appointment with the physician(s) listed elsewhere in this documentation.  You may take pain medication as needed but ONLY as prescribed.  Please also take your prescribed Flomax daily.    Please see your doctor as soon as possible as stones may take 1-3 weeks to pass and you may require additional care or medications.  Do not drink alcohol, drive or participate in any other potentially dangerous activities while taking opiate pain medication as it may make you sleepy. Do not take this medication with any other sedating medications, either prescription or over-the-counter.    Take oxycodone as needed for severe pain.  This medication is an opiate (or narcotic) pain medication and can be habit forming.  Use it as little as possible to achieve adequate pain control.  Do not use or use it with extreme caution if you have a history of opiate abuse or dependence.  If you are on a pain contract with your primary care doctor or a pain specialist, be sure to let them know you were prescribed this medication today from the [facility] Emergency Department.  This medication is intended for your use only - do not give any to anyone else and keep it in a secure place where nobody else, especially children, have access to it.  It will also cause or worsen constipation, so you may want to consider taking an over-the-counter stool softener while you are taking this medication.  Return to the Emergency Department (ED) or call your doctor if you have any worsening pain, fever, painful urination, are unable to urinate, or develop other symptoms that concern you.

## 2020-07-16 ENCOUNTER — Ambulatory Visit (INDEPENDENT_AMBULATORY_CARE_PROVIDER_SITE_OTHER): Payer: 59 | Admitting: Urology

## 2020-07-16 ENCOUNTER — Other Ambulatory Visit: Payer: Self-pay | Admitting: *Deleted

## 2020-07-16 ENCOUNTER — Other Ambulatory Visit: Payer: Self-pay

## 2020-07-16 ENCOUNTER — Ambulatory Visit: Admission: RE | Admit: 2020-07-16 | Payer: 59 | Source: Home / Self Care

## 2020-07-16 ENCOUNTER — Ambulatory Visit
Admission: RE | Admit: 2020-07-16 | Discharge: 2020-07-16 | Disposition: A | Discharge status: Home / Self Care | Payer: 59 | Attending: Urology | Admitting: Urology

## 2020-07-16 ENCOUNTER — Encounter: Payer: Self-pay | Admitting: Urology

## 2020-07-16 ENCOUNTER — Ambulatory Visit
Admission: RE | Admit: 2020-07-16 | Discharge: 2020-07-16 | Disposition: A | Discharge status: Home / Self Care | Payer: 59 | Source: Ambulatory Visit | Attending: Urology | Admitting: Urology

## 2020-07-16 VITALS — BP 128/76 | HR 54 | Ht 66.0 in | Wt 181.0 lb

## 2020-07-16 DIAGNOSIS — R3129 Other microscopic hematuria: Secondary | ICD-10-CM

## 2020-07-16 DIAGNOSIS — Z87442 Personal history of urinary calculi: Secondary | ICD-10-CM | POA: Diagnosis not present

## 2020-07-16 DIAGNOSIS — N201 Calculus of ureter: Secondary | ICD-10-CM | POA: Diagnosis not present

## 2020-07-16 DIAGNOSIS — I878 Other specified disorders of veins: Secondary | ICD-10-CM | POA: Diagnosis not present

## 2020-07-16 DIAGNOSIS — N132 Hydronephrosis with renal and ureteral calculous obstruction: Secondary | ICD-10-CM | POA: Diagnosis not present

## 2020-07-16 DIAGNOSIS — N200 Calculus of kidney: Secondary | ICD-10-CM | POA: Diagnosis not present

## 2020-07-16 LAB — URINE CULTURE: Culture: NO GROWTH

## 2020-07-16 MED ORDER — TAMSULOSIN HCL 0.4 MG PO CAPS: 0.4000 mg | ORAL_CAPSULE | Freq: Every day | ORAL | 3 refills | Status: DC

## 2020-07-16 NOTE — Progress Notes (Signed)
07/16/2020 10:56 AM   Ryan Willis. 1960-06-11 [id number]  Referring provider: No referring provider defined for this encounter.  Chief Complaint  Patient presents with  . Follow-up    ER follow-up for kidney stone   Urological history: 1.  Nephrolithiasis -Needed passive dilation with a right ureteral stent due to a stenotic right UO in September 2020 prior to URS from right ureteral stone -Stone composition 100% calcium oxalate monohydrate  2.  High risk hematuria -Non-smoker -Several various upper tract imaging studies secondary to his history of cirrhosis and positive for nephrolithiasis -Cystoscopy 03/17/2020 NED  3.  Left simple renal cyst  4.  Prostatomegaly -Prostate volume 68 cc   HPI: Ryan Hue. is a 60 y.o. male with cirrhosis of the liver who presents today for follow up after being seen in the ED for kidney stones.  He presented to the emergency room on July 14, 2020 after the sudden onset of severe pain in his left lower quadrant.  CT renal stone study demonstrate an obstructing 8 mm left UPJ stone and another 8 mm left renal calculus.  WBC count is 2.7, platelets of 61, serum creatinine 1.03, UA positive for greater than 50 RBCs 11-20 WBCs and urine culture was negative.  KUB taken today demonstrates the obstructing 8 mm left UPJ stone and the 8 mm left renal calculus.  He is having no pain during the visit.  He took a pain medication at 3 am.  Patient denies any modifying or aggravating factors.  Patient denies any gross hematuria, dysuria or suprapubic/flank pain.  Patient denies any fevers, chills, nausea or vomiting.   PMH: Past Medical History:  Diagnosis Date  . Cirrhosis of liver ([place])   . Diverticulitis   . Family history of adverse reaction to anesthesia    MOTHER HAD N/V  . History of kidney stones   . Hypercholesteremia   . Hypothyroidism   . Peyronie's disease   . Renal cyst, left   . Seasonal allergies   . Thrombocytopenia  [facility])     Surgical History: Past Surgical History:  Procedure Laterality Date  . CHOLECYSTECTOMY    . COLONOSCOPY  2006  . COLONOSCOPY WITH PROPOFOL N/A 09/08/2017   Procedure: COLONOSCOPY WITH PROPOFOL;  Surgeon: [name], MD;  Location: [facility] ENDOSCOPY;  Service: Gastroenterology;  Laterality: N/A;  . COLONOSCOPY WITH PROPOFOL N/A 01/01/2018   Procedure: COLONOSCOPY WITH PROPOFOL;  Surgeon: [name], MD;  Location: [facility] ENDOSCOPY;  Service: Gastroenterology;  Laterality: N/A;  . CYSTOSCOPY/URETEROSCOPY/HOLMIUM LASER/STENT PLACEMENT Right 01/17/2019   Procedure: CYSTOSCOPY/URETEROSCOPY/STENT PLACEMENT;  Surgeon: Hollice Espy, MD;  Location: [facility] ORS;  Service: Urology;  Laterality: Right;  . CYSTOSCOPY/URETEROSCOPY/HOLMIUM LASER/STENT PLACEMENT Right 01/31/2019   Procedure: CYSTOSCOPY/URETEROSCOPY/HOLMIUM LASER/STENT EXCHANGE;  Surgeon: Hollice Espy, MD;  Location: [facility] ORS;  Service: Urology;  Laterality: Right;  . ESOPHAGOGASTRODUODENOSCOPY (EGD) WITH PROPOFOL N/A 09/08/2017   Procedure: ESOPHAGOGASTRODUODENOSCOPY (EGD) WITH PROPOFOL;  Surgeon: [name], MD;  Location: [facility] ENDOSCOPY;  Service: Gastroenterology;  Laterality: N/A;  . ESOPHAGOGASTRODUODENOSCOPY (EGD) WITH PROPOFOL N/A 05/31/2020   Procedure: ESOPHAGOGASTRODUODENOSCOPY (EGD) WITH PROPOFOL;  Surgeon: [name], MD;  Location: [facility] ENDOSCOPY;  Service: Gastroenterology;  Laterality: N/A;  . HERNIA REPAIR  1981  . INTRAOPERATIVE CHOLANGIOGRAM N/A 04/19/2019   Procedure: INTRAOPERATIVE CHOLANGIOGRAM;  Surgeon: Jules Husbands, MD;  Location: [facility] ORS;  Service: General;  Laterality: N/A;  . SHOULDER SURGERY Right 2001   ROTATOR CUFF REPAIR  . VENTRAL HERNIA REPAIR N/A 10/27/2019   Procedure:  HERNIA REPAIR VENTRAL ADULT, Open;  Surgeon: Jules Husbands, MD;  Location: [facility] ORS;  Service: General;  Laterality: N/A;    Home Medications:  Allergies as of 07/16/2020   No Known Allergies     Medication List        Accurate as of July 16, 2020 10:56 AM. If you have any questions, ask your nurse or doctor.        lactulose 10 GM/15ML solution Commonly known as: CHRONULAC Take 15 mLs (10 g total) by mouth 3 (three) times daily.   multivitamin with minerals Tabs tablet Take 1 tablet by mouth daily.   nadolol 20 MG tablet Commonly known as: Corgard Take 1 tablet (20 mg total) by mouth daily.   oxyCODONE 5 MG immediate release tablet Commonly known as: Roxicodone Take 1-2 tablets by mouth every 6 hours as needed for moderate to severe pain.   tamsulosin 0.4 MG Caps capsule Commonly known as: FLOMAX Take 1 capsule (0.4 mg total) by mouth daily. Started by: Zara Council, PA-C       Allergies: No Known Allergies  Family History: Family History  Problem Relation Age of Onset  . Kidney cancer Mother   . Lung cancer Mother   . Colon cancer Father     Social History:  reports that he has never smoked. He has never used smokeless tobacco. He reports previous alcohol use. He reports that he does not use drugs.  ROS: Pertinent ROS in HPI  Physical Exam: BP 128/76   Pulse (!) 54   Ht 5' 6"  (1.676 m)   Wt 181 lb (82.1 kg)   BMI 29.21 kg/m   Constitutional:  Well nourished. Alert and oriented, No acute distress. HEENT: [place] AT, mask in place.  Trachea midline Cardiovascular: No clubbing, cyanosis, or edema. Respiratory: Normal respiratory effort, no increased work of breathing. GU: No CVA tenderness.  No bladder fullness or masses.   Neurologic: Grossly intact, no focal deficits, moving all 4 extremities. Psychiatric: Normal mood and affect.  Laboratory Data: Lab Results  Component Value Date   WBC 2.7 (L) 07/14/2020   HGB 12.9 (L) 07/14/2020   HCT 37.3 (L) 07/14/2020   MCV 97.4 07/14/2020   PLT 61 (L) 07/14/2020    Lab Results  Component Value Date   CREATININE 1.03 07/14/2020    Lab Results  Component Value Date   TSH 3.030 06/09/2019       Component Value  Date/Time   CHOL 120 06/09/2019 0832   HDL 51 06/09/2019 0832   CHOLHDL 2.4 06/09/2019 0832   LDLCALC 54 06/09/2019 0832    Lab Results  Component Value Date   AST 37 07/14/2020   Lab Results  Component Value Date   ALT 23 07/14/2020    Urinalysis    Component Value Date/Time   COLORURINE YELLOW (A) 07/14/2020 2205   APPEARANCEUR CLOUDY (A) 07/14/2020 2205   APPEARANCEUR Cloudy (A) 03/27/2020 1101   LABSPEC 1.018 07/14/2020 2205   PHURINE 8.0 07/14/2020 2205   GLUCOSEU NEGATIVE 07/14/2020 2205   HGBUR LARGE (A) 07/14/2020 2205   BILIRUBINUR NEGATIVE 07/14/2020 2205   BILIRUBINUR Negative 03/27/2020 1101   KETONESUR NEGATIVE 07/14/2020 2205   PROTEINUR NEGATIVE 07/14/2020 2205   UROBILINOGEN 0.2 06/09/2019 0902   NITRITE NEGATIVE 07/14/2020 2205   LEUKOCYTESUR NEGATIVE 07/14/2020 2205    I have reviewed the labs.   Pertinent Imaging: Narrative & Impression  CLINICAL DATA:  Flank pain.  Kidney stone suspected.  EXAM: CT ABDOMEN  AND PELVIS WITHOUT CONTRAST  TECHNIQUE: Multidetector CT imaging of the abdomen and pelvis was performed following the standard protocol without IV contrast.  COMPARISON:  CT abdomen pelvis 04/23/2020, CT abdomen pelvis 07/15/2017, MRI abdomen reflux is 321  FINDINGS: Lower chest: Marked paraesophageal varices. Trace free fluid along the esophagus. Similar subpleural nodularity within the bilateral lower lobes. Left anterior descending coronary artery calcifications.  Hepatobiliary: Nodular hepatic contour with enlargement of the caudate lobe and left hepatic lobe. No focal liver abnormality is seen. Status post cholecystectomy. No biliary dilatation.  Pancreas: No focal lesion. Normal pancreatic contour. No surrounding inflammatory changes. No main pancreatic ductal dilatation.  Spleen: The spleen is enlarged measuring up to 17.5cm. No focal splenic lesion.  Adrenals/Urinary Tract:  No adrenal nodule  bilaterally.  Left nephrolithiasis measuring up to 6 mm. There is a 5 mm calcified stone at the left ureteropelvic junction with associated proximal mild hydronephrosis. Also associated is left perinephric stranding. No right nephrolithiasis, no right hydronephrosis. There is in similar appearing exophytic fluid density lesion measuring up to 6.2 cm along the inferior pole of the left kidney that likely represents a simple renal cyst. No bilateral contour-deforming renal mass. No ureterolithiasis or hydroureter. The urinary bladder is unremarkable.  Stomach/Bowel: Layering hyperdensity within the gastric lumen likely related to administered medication. Stomach is within normal limits. No evidence of bowel wall thickening or dilatation. Diffuse descending and sigmoid diverticulosis. No pneumatosis. Under distended rectosigmoid colon. Appendix appears normal.  Vascular/Lymphatic: Venous collaterals. Stable in size 2.3 x 1.2cm hazy density surrounding origin of the inferior mesenteric artery (2: 46-45). No abdominal aorta or iliac aneurysm. Mild atherosclerotic plaque of the aorta and its branches. No abdominal, pelvic, or inguinal lymphadenopathy.  Reproductive: The prostate is enlarged measuring up to 5 cm.  Other: No intraperitoneal free fluid. No intraperitoneal free gas. No organized fluid collection.  Musculoskeletal:  No abdominal wall hernia or abnormality.  No suspicious lytic or blastic osseous lesions. No acute displaced fracture.  IMPRESSION: 1. Obstructive 5 mm left ureteropelvic junction stone. Correlate with urinalysis to evaluate for superimposed infection. 2. Nonobstructive left nephrolithiasis measuring up to 6 mm. 3. Indeterminate stable in size 2.3 x 1.2cm hazy density surrounding origin of the inferior mesenteric artery. Recommend attention on follow-up. 4. Cirrhosis with portal hypertension. Markedly limited evaluation for focal hepatic lesion  on this noncontrast study. Consider MRI liver protocol for further evaluation if clinically. 5. Marked paraesophageal varices. 6. Diffuse descending and sigmoid diverticulosis with no CT findings of acute diverticulitis. 7. Prostatomegaly. 8.  Aortic Atherosclerosis (ICD10-I70.0).   Electronically Signed   By: Iven Finn M.D.   On: 07/14/2020 23:45   CLINICAL DATA:  Kidney stones  EXAM: ABDOMEN - 1 VIEW  COMPARISON:  CT abdomen and pelvis 07/14/2020  FINDINGS: Multiple calculi within vas deferens.  BILATERAL pelvic phleboliths.  9 x 6 mm calculus projects at LEFT ureteropelvic junction, at the superior aspect of the LEFT transverse process of L3.  Additional nonobstructing LEFT renal calculus 9 x 8 mm.  No RIGHT-sided urinary tract calcifications identified.  Bowel gas pattern normal.  Degenerative disc disease changes lumbar spine.  IMPRESSION: Persistent visualization of a 9 x 6 mm LEFT UPJ calculus as well as a 9 x 8 mm LEFT renal calculus.   Electronically Signed   By: Lavonia Dana M.D.   On: 07/17/2020 09:11 I have independently reviewed the films.    Assessment & Plan:    1. Left ureteral stone - patient not  a candidate for ESWL due to thrombocytopenia and elevated prothrombin time - schedule left ureteroscopy with laser lithotripsy and ureteral stent placement - explained to the patient how the procedure is performed and the risks involved - informed patient that they will have a stent placed during the procedure and will remain in place after the procedure for a short time.  - stent may be removed in the office with a cystoscope or patient may be instructed to remove the stent themselves by the string - described "stent pain" as feelings of needing to urinate/overactive bladder and a warm, tingling sensation to intense pain in the affected flank - residual stones within the kidney or ureter may be present after the procedure and may  need to have these addressed at a different encounter - injury to the ureter is the most common intra-operative risk, it may result in an open procedure to correct the defect - infection and bleeding are also risks - explained the risks of general anesthesia, such as: MI, CVA, paralysis, coma and/or death. - advised to contact our office or seek treatment in the ED if becomes febrile or pain/ vomiting are difficult control in order to arrange for emergent/urgent intervention  2. Left hydronephrosis - obtain RUS to ensure the hydronephrosis has resolved once they have passed and/or recovered from procedure to ensure to iatrogenic hydronephrosis remains - it is explained to the patient that it is important to document resolution of the hydronephrosis as "silent hydronephrosis" can occur and cause damage and/or loss of the kidney  3. Microscopic hematuria - UA in the ED  - continue to monitor the patient's UA after the treatment/passage of the stone to ensure the hematuria has resolved - if hematuria persists, we will pursue a hematuria workup with CT Urogram and cystoscopy if appropriate.  Return for left URS/LL/ureteral stent placement.  These notes generated with voice recognition software. I apologize for typographical errors.  Zara Council, PA-C  [facility] [address]  [place], [place] [address] [phone]

## 2020-07-16 NOTE — H&P (View-Only) (Signed)
07/16/2020 10:56 AM   Ryan Willis. [birth year] [id number]  Referring provider: No referring provider defined for this encounter.  Chief Complaint  Patient presents with  . Follow-up    ER follow-up for kidney stone   Urological history: 1.  Nephrolithiasis -Needed passive dilation with a right ureteral stent due to a stenotic right UO in September 2020 prior to URS from right ureteral stone -Stone composition 100% calcium oxalate monohydrate  2.  High risk hematuria -Non-smoker -Several various upper tract imaging studies secondary to his history of cirrhosis and positive for nephrolithiasis -Cystoscopy 03/17/2020 NED  3.  Left simple renal cyst  4.  Prostatomegaly -Prostate volume 68 cc   HPI: Ryan Willis. is a 60 y.o. male with cirrhosis of the liver who presents today for follow up after being seen in the ED for kidney stones.  He presented to the emergency room on July 14, 2020 after the sudden onset of severe pain in his left lower quadrant.  CT renal stone study demonstrate an obstructing 8 mm left UPJ stone and another 8 mm left renal calculus.  WBC count is 2.7, platelets of 61, serum creatinine 1.03, UA positive for greater than 50 RBCs 11-20 WBCs and urine culture was negative.  KUB taken today demonstrates the obstructing 8 mm left UPJ stone and the 8 mm left renal calculus.  He is having no pain during the visit.  He took a pain medication at 3 am.  Patient denies any modifying or aggravating factors.  Patient denies any gross hematuria, dysuria or suprapubic/flank pain.  Patient denies any fevers, chills, nausea or vomiting.   PMH: Past Medical History:  Diagnosis Date  . Cirrhosis of liver ([place])   . Diverticulitis   . Family history of adverse reaction to anesthesia    MOTHER HAD N/V  . History of kidney stones   . Hypercholesteremia   . Hypothyroidism   . Peyronie's disease   . Renal cyst, left   . Seasonal allergies   . Thrombocytopenia  [facility])     Surgical History: Past Surgical History:  Procedure Laterality Date  . CHOLECYSTECTOMY    . COLONOSCOPY  2006  . COLONOSCOPY WITH PROPOFOL N/A 09/08/2017   Procedure: COLONOSCOPY WITH PROPOFOL;  Surgeon: [name], MD;  Location: [facility] ENDOSCOPY;  Service: Gastroenterology;  Laterality: N/A;  . COLONOSCOPY WITH PROPOFOL N/A 01/01/2018   Procedure: COLONOSCOPY WITH PROPOFOL;  Surgeon: [name], MD;  Location: [facility] ENDOSCOPY;  Service: Gastroenterology;  Laterality: N/A;  . CYSTOSCOPY/URETEROSCOPY/HOLMIUM LASER/STENT PLACEMENT Right 01/17/2019   Procedure: CYSTOSCOPY/URETEROSCOPY/STENT PLACEMENT;  Surgeon: Hollice Espy, MD;  Location: [facility] ORS;  Service: Urology;  Laterality: Right;  . CYSTOSCOPY/URETEROSCOPY/HOLMIUM LASER/STENT PLACEMENT Right 01/31/2019   Procedure: CYSTOSCOPY/URETEROSCOPY/HOLMIUM LASER/STENT EXCHANGE;  Surgeon: Hollice Espy, MD;  Location: [facility] ORS;  Service: Urology;  Laterality: Right;  . ESOPHAGOGASTRODUODENOSCOPY (EGD) WITH PROPOFOL N/A 09/08/2017   Procedure: ESOPHAGOGASTRODUODENOSCOPY (EGD) WITH PROPOFOL;  Surgeon: [name], MD;  Location: [facility] ENDOSCOPY;  Service: Gastroenterology;  Laterality: N/A;  . ESOPHAGOGASTRODUODENOSCOPY (EGD) WITH PROPOFOL N/A 05/31/2020   Procedure: ESOPHAGOGASTRODUODENOSCOPY (EGD) WITH PROPOFOL;  Surgeon: [name], MD;  Location: [facility] ENDOSCOPY;  Service: Gastroenterology;  Laterality: N/A;  . HERNIA REPAIR  1981  . INTRAOPERATIVE CHOLANGIOGRAM N/A 04/19/2019   Procedure: INTRAOPERATIVE CHOLANGIOGRAM;  Surgeon: Jules Husbands, MD;  Location: [facility] ORS;  Service: General;  Laterality: N/A;  . SHOULDER SURGERY Right 2001   ROTATOR CUFF REPAIR  . VENTRAL HERNIA REPAIR N/A 10/27/2019   Procedure:  HERNIA REPAIR VENTRAL ADULT, Open;  Surgeon: Jules Husbands, MD;  Location: [facility] ORS;  Service: General;  Laterality: N/A;    Home Medications:  Allergies as of 07/16/2020   No Known Allergies     Medication List        Accurate as of July 16, 2020 10:56 AM. If you have any questions, ask your nurse or doctor.        lactulose 10 GM/15ML solution Commonly known as: CHRONULAC Take 15 mLs (10 g total) by mouth 3 (three) times daily.   multivitamin with minerals Tabs tablet Take 1 tablet by mouth daily.   nadolol 20 MG tablet Commonly known as: Corgard Take 1 tablet (20 mg total) by mouth daily.   oxyCODONE 5 MG immediate release tablet Commonly known as: Roxicodone Take 1-2 tablets by mouth every 6 hours as needed for moderate to severe pain.   tamsulosin 0.4 MG Caps capsule Commonly known as: FLOMAX Take 1 capsule (0.4 mg total) by mouth daily. Started by: Zara Council, PA-C       Allergies: No Known Allergies  Family History: Family History  Problem Relation Age of Onset  . Kidney cancer Mother   . Lung cancer Mother   . Colon cancer Father     Social History:  reports that he has never smoked. He has never used smokeless tobacco. He reports previous alcohol use. He reports that he does not use drugs.  ROS: Pertinent ROS in HPI  Physical Exam: BP 128/76   Pulse (!) 54   Ht 5' 6"  (1.676 m)   Wt 181 lb (82.1 kg)   BMI 29.21 kg/m   Constitutional:  Well nourished. Alert and oriented, No acute distress. HEENT: [place] AT, mask in place.  Trachea midline Cardiovascular: No clubbing, cyanosis, or edema. Respiratory: Normal respiratory effort, no increased work of breathing. GU: No CVA tenderness.  No bladder fullness or masses.   Neurologic: Grossly intact, no focal deficits, moving all 4 extremities. Psychiatric: Normal mood and affect.  Laboratory Data: Lab Results  Component Value Date   WBC 2.7 (L) 07/14/2020   HGB 12.9 (L) 07/14/2020   HCT 37.3 (L) 07/14/2020   MCV 97.4 07/14/2020   PLT 61 (L) 07/14/2020    Lab Results  Component Value Date   CREATININE 1.03 07/14/2020    Lab Results  Component Value Date   TSH 3.030 06/09/2019       Component Value  Date/Time   CHOL 120 06/09/2019 0832   HDL 51 06/09/2019 0832   CHOLHDL 2.4 06/09/2019 0832   LDLCALC 54 06/09/2019 0832    Lab Results  Component Value Date   AST 37 07/14/2020   Lab Results  Component Value Date   ALT 23 07/14/2020    Urinalysis    Component Value Date/Time   COLORURINE YELLOW (A) 07/14/2020 2205   APPEARANCEUR CLOUDY (A) 07/14/2020 2205   APPEARANCEUR Cloudy (A) 03/27/2020 1101   LABSPEC 1.018 07/14/2020 2205   PHURINE 8.0 07/14/2020 2205   GLUCOSEU NEGATIVE 07/14/2020 2205   HGBUR LARGE (A) 07/14/2020 2205   BILIRUBINUR NEGATIVE 07/14/2020 2205   BILIRUBINUR Negative 03/27/2020 1101   KETONESUR NEGATIVE 07/14/2020 2205   PROTEINUR NEGATIVE 07/14/2020 2205   UROBILINOGEN 0.2 06/09/2019 0902   NITRITE NEGATIVE 07/14/2020 2205   LEUKOCYTESUR NEGATIVE 07/14/2020 2205    I have reviewed the labs.   Pertinent Imaging: Narrative & Impression  CLINICAL DATA:  Flank pain.  Kidney stone suspected.  EXAM: CT ABDOMEN  AND PELVIS WITHOUT CONTRAST  TECHNIQUE: Multidetector CT imaging of the abdomen and pelvis was performed following the standard protocol without IV contrast.  COMPARISON:  CT abdomen pelvis 04/23/2020, CT abdomen pelvis 07/15/2017, MRI abdomen reflux is 321  FINDINGS: Lower chest: Marked paraesophageal varices. Trace free fluid along the esophagus. Similar subpleural nodularity within the bilateral lower lobes. Left anterior descending coronary artery calcifications.  Hepatobiliary: Nodular hepatic contour with enlargement of the caudate lobe and left hepatic lobe. No focal liver abnormality is seen. Status post cholecystectomy. No biliary dilatation.  Pancreas: No focal lesion. Normal pancreatic contour. No surrounding inflammatory changes. No main pancreatic ductal dilatation.  Spleen: The spleen is enlarged measuring up to 17.5cm. No focal splenic lesion.  Adrenals/Urinary Tract:  No adrenal nodule  bilaterally.  Left nephrolithiasis measuring up to 6 mm. There is a 5 mm calcified stone at the left ureteropelvic junction with associated proximal mild hydronephrosis. Also associated is left perinephric stranding. No right nephrolithiasis, no right hydronephrosis. There is in similar appearing exophytic fluid density lesion measuring up to 6.2 cm along the inferior pole of the left kidney that likely represents a simple renal cyst. No bilateral contour-deforming renal mass. No ureterolithiasis or hydroureter. The urinary bladder is unremarkable.  Stomach/Bowel: Layering hyperdensity within the gastric lumen likely related to administered medication. Stomach is within normal limits. No evidence of bowel wall thickening or dilatation. Diffuse descending and sigmoid diverticulosis. No pneumatosis. Under distended rectosigmoid colon. Appendix appears normal.  Vascular/Lymphatic: Venous collaterals. Stable in size 2.3 x 1.2cm hazy density surrounding origin of the inferior mesenteric artery (2: 46-45). No abdominal aorta or iliac aneurysm. Mild atherosclerotic plaque of the aorta and its branches. No abdominal, pelvic, or inguinal lymphadenopathy.  Reproductive: The prostate is enlarged measuring up to 5 cm.  Other: No intraperitoneal free fluid. No intraperitoneal free gas. No organized fluid collection.  Musculoskeletal:  No abdominal wall hernia or abnormality.  No suspicious lytic or blastic osseous lesions. No acute displaced fracture.  IMPRESSION: 1. Obstructive 5 mm left ureteropelvic junction stone. Correlate with urinalysis to evaluate for superimposed infection. 2. Nonobstructive left nephrolithiasis measuring up to 6 mm. 3. Indeterminate stable in size 2.3 x 1.2cm hazy density surrounding origin of the inferior mesenteric artery. Recommend attention on follow-up. 4. Cirrhosis with portal hypertension. Markedly limited evaluation for focal hepatic lesion  on this noncontrast study. Consider MRI liver protocol for further evaluation if clinically. 5. Marked paraesophageal varices. 6. Diffuse descending and sigmoid diverticulosis with no CT findings of acute diverticulitis. 7. Prostatomegaly. 8.  Aortic Atherosclerosis (ICD10-I70.0).   Electronically Signed   By: Iven Finn M.D.   On: 07/14/2020 23:45   CLINICAL DATA:  Kidney stones  EXAM: ABDOMEN - 1 VIEW  COMPARISON:  CT abdomen and pelvis 07/14/2020  FINDINGS: Multiple calculi within vas deferens.  BILATERAL pelvic phleboliths.  9 x 6 mm calculus projects at LEFT ureteropelvic junction, at the superior aspect of the LEFT transverse process of L3.  Additional nonobstructing LEFT renal calculus 9 x 8 mm.  No RIGHT-sided urinary tract calcifications identified.  Bowel gas pattern normal.  Degenerative disc disease changes lumbar spine.  IMPRESSION: Persistent visualization of a 9 x 6 mm LEFT UPJ calculus as well as a 9 x 8 mm LEFT renal calculus.   Electronically Signed   By: Lavonia Dana M.D.   On: 07/17/2020 09:11 I have independently reviewed the films.    Assessment & Plan:    1. Left ureteral stone - patient not  a candidate for ESWL due to thrombocytopenia and elevated prothrombin time - schedule left ureteroscopy with laser lithotripsy and ureteral stent placement - explained to the patient how the procedure is performed and the risks involved - informed patient that they will have a stent placed during the procedure and will remain in place after the procedure for a short time.  - stent may be removed in the office with a cystoscope or patient may be instructed to remove the stent themselves by the string - described "stent pain" as feelings of needing to urinate/overactive bladder and a warm, tingling sensation to intense pain in the affected flank - residual stones within the kidney or ureter may be present after the procedure and may  need to have these addressed at a different encounter - injury to the ureter is the most common intra-operative risk, it may result in an open procedure to correct the defect - infection and bleeding are also risks - explained the risks of general anesthesia, such as: MI, CVA, paralysis, coma and/or death. - advised to contact our office or seek treatment in the ED if becomes febrile or pain/ vomiting are difficult control in order to arrange for emergent/urgent intervention  2. Left hydronephrosis - obtain RUS to ensure the hydronephrosis has resolved once they have passed and/or recovered from procedure to ensure to iatrogenic hydronephrosis remains - it is explained to the patient that it is important to document resolution of the hydronephrosis as "silent hydronephrosis" can occur and cause damage and/or loss of the kidney  3. Microscopic hematuria - UA in the ED  - continue to monitor the patient's UA after the treatment/passage of the stone to ensure the hematuria has resolved - if hematuria persists, we will pursue a hematuria workup with CT Urogram and cystoscopy if appropriate.  Return for left URS/LL/ureteral stent placement.  These notes generated with voice recognition software. I apologize for typographical errors.  Zara Council, PA-C  [facility] [address]  [place], [place] [address] [phone]

## 2020-07-17 ENCOUNTER — Other Ambulatory Visit: Payer: Self-pay | Admitting: Urology

## 2020-07-17 DIAGNOSIS — N200 Calculus of kidney: Secondary | ICD-10-CM

## 2020-07-17 DIAGNOSIS — N201 Calculus of ureter: Secondary | ICD-10-CM

## 2020-07-19 ENCOUNTER — Other Ambulatory Visit: Payer: Self-pay

## 2020-07-19 DIAGNOSIS — N201 Calculus of ureter: Secondary | ICD-10-CM

## 2020-07-20 ENCOUNTER — Other Ambulatory Visit: Payer: Self-pay | Admitting: Gastroenterology

## 2020-07-23 ENCOUNTER — Other Ambulatory Visit: Payer: Self-pay

## 2020-07-23 ENCOUNTER — Other Ambulatory Visit: Payer: 59

## 2020-07-23 DIAGNOSIS — N201 Calculus of ureter: Secondary | ICD-10-CM | POA: Diagnosis not present

## 2020-07-24 ENCOUNTER — Other Ambulatory Visit: Payer: Self-pay

## 2020-07-24 ENCOUNTER — Encounter
Admission: RE | Admit: 2020-07-24 | Discharge: 2020-07-24 | Disposition: A | Discharge status: Home / Self Care | Payer: 59 | Source: Ambulatory Visit | Attending: Urology | Admitting: Urology

## 2020-07-24 LAB — URINALYSIS, COMPLETE
Bilirubin, UA: NEGATIVE
Glucose, UA: NEGATIVE
Ketones, UA: NEGATIVE
Leukocytes,UA: NEGATIVE
Nitrite, UA: NEGATIVE
Protein,UA: NEGATIVE
Specific Gravity, UA: 1.025 (ref 1.005–1.030)
Urobilinogen, Ur: 2 mg/dL — ABNORMAL HIGH (ref 0.2–1.0)
pH, UA: 5.5 (ref 5.0–7.5)

## 2020-07-24 LAB — MICROSCOPIC EXAMINATION: RBC, Urine: >30 /hpf — AB (ref 0–2)

## 2020-07-24 NOTE — Patient Instructions (Signed)
Your procedure is scheduled on: Mon 4/4 Report to Registration desk in medical mall.   Then to 2nd floor To find out your arrival time please call [phone] between 1PM - 3PM on Friday 4/1  Remember: Instructions that are not followed completely may result in serious medical risk,  up to and including death, or upon the discretion of your surgeon and anesthesiologist your  surgery may need to be rescheduled.     _X__ 1. Do not eat food after midnight the night before your procedure.                 No chewing gum or hard candies. You may drink clear liquids up to 2 hours                 before you are scheduled to arrive for your surgery- DO not drink clear                 liquids within 2 hours of the start of your surgery.                 Clear Liquids include:  water, apple juice without pulp, clear Gatorade, G2 or                  Gatorade Zero (avoid Red/Purple/Blue), Black Coffee or Tea (Do not add                 anything to coffee or tea). _____2.   Complete the "Ensure Clear Pre-surgery Clear Carbohydrate Drink" provided to you, 2 hours before arrival. **If you       are diabetic you will be provided with an alternative drink, Gatorade Zero or G2.  __X__2.  On the morning of surgery brush your teeth with toothpaste and water, you                may rinse your mouth with mouthwash if you wish.  Do not swallow any toothpaste of mouthwash.     _X__ 3.  No Alcohol for 24 hours before or after surgery.   ___ 4.  Do Not Smoke or use e-cigarettes For 24 Hours Prior to Your Surgery.                 Do not use any chewable tobacco products for at least 6 hours prior to                 Surgery.  __  5.  Do not use any recreational drugs (marijuana, cocaine, heroin, ecstasy, MDMA or other)                For at least one week prior to your surgery.  Combination of these drugs with anesthesia                May have life threatening results.  ____  6.  Bring all  medications with you on the day of surgery if instructed.   _x___  7.  Notify your doctor if there is any change in your medical condition      (cold, fever, infections).     Do not wear jewelry, . Do not wear lotions, You may wear deodorant.. Men may shave face and neck. Do not bring valuables to the hospital.    [facility] is not responsible for any belongings or valuables.  Contacts, dentures or bridgework may not be worn into surgery. Leave your suitcase in the car. After surgery it  may be brought to your room. For patients admitted to the hospital, discharge time is determined by your treatment team.   Patients discharged the day of surgery will not be allowed to drive home.   Make arrangements for someone to be with you for the first 24 hours of your Same Day Discharge.    Please read over the following fact sheets that you were given:       _x___ Take these medicines the morning of surgery with A SIP OF WATER:    1. tamsulosin (FLOMAX) 0.4 MG CAPS capsule  2.   3.   4.  5.  6.  ____ Fleet Enema (as directed)   ____ Use CHG Soap (or wipes) as directed  ____ Use Benzoyl Peroxide Gel as instructed  ____ Use inhalers on the day of surgery  ____ Stop metformin 2 days prior to surgery    ____ Take 1/2 of usual insulin dose the night before surgery. No insulin the morning          of surgery.   ____ Stop Coumadin/Plavix/aspirin on   ____ Stop Anti-inflammatories on    ____ Stop supplements until after surgery.    ____ Bring C-Pap to the hospital.    If you have any questions regarding your pre-procedure instructions,  Please call Pre-admit Testing at [phone]

## 2020-07-26 ENCOUNTER — Other Ambulatory Visit: Payer: Self-pay

## 2020-07-26 ENCOUNTER — Other Ambulatory Visit
Admission: RE | Admit: 2020-07-26 | Discharge: 2020-07-26 | Disposition: A | Discharge status: Home / Self Care | Payer: 59 | Source: Ambulatory Visit | Attending: Urology | Admitting: Urology

## 2020-07-26 DIAGNOSIS — Z20822 Contact with and (suspected) exposure to covid-19: Secondary | ICD-10-CM | POA: Insufficient documentation

## 2020-07-26 DIAGNOSIS — Z01812 Encounter for preprocedural laboratory examination: Secondary | ICD-10-CM | POA: Insufficient documentation

## 2020-07-26 LAB — CULTURE, URINE COMPREHENSIVE

## 2020-07-26 LAB — SARS CORONAVIRUS 2 (TAT 6-24 HRS): SARS Coronavirus 2: NEGATIVE

## 2020-07-29 MED ORDER — CHLORHEXIDINE GLUCONATE 0.12 % MT SOLN: 15.0000 mL | Freq: Once | OROMUCOSAL | Status: DC

## 2020-07-29 MED ORDER — CEFAZOLIN SODIUM-DEXTROSE 2-4 GM/100ML-% IV SOLN: 2.0000 g | INTRAVENOUS | Status: DC

## 2020-07-29 MED ORDER — ORAL CARE MOUTH RINSE: 15.0000 mL | Freq: Once | OROMUCOSAL | Status: DC

## 2020-07-29 MED ORDER — LACTATED RINGERS IV SOLN: INTRAVENOUS | Status: DC

## 2020-07-29 MED ORDER — FAMOTIDINE 20 MG PO TABS: 20.0000 mg | ORAL_TABLET | Freq: Once | ORAL | Status: DC

## 2020-07-30 ENCOUNTER — Encounter: Admission: RE | Payer: Self-pay | Source: Home / Self Care

## 2020-07-30 ENCOUNTER — Ambulatory Visit: Admission: RE | Admit: 2020-07-30 | Payer: 59 | Source: Home / Self Care | Admitting: Urology

## 2020-07-30 DIAGNOSIS — N200 Calculus of kidney: Secondary | ICD-10-CM

## 2020-07-30 DIAGNOSIS — N201 Calculus of ureter: Secondary | ICD-10-CM

## 2020-07-30 SURGERY — CYSTOSCOPY/URETEROSCOPY/HOLMIUM LASER/STENT PLACEMENT
Anesthesia: Choice | Laterality: Left

## 2020-08-09 ENCOUNTER — Other Ambulatory Visit
Admission: RE | Admit: 2020-08-09 | Discharge: 2020-08-09 | Disposition: A | Discharge status: Home / Self Care | Payer: 59 | Source: Ambulatory Visit | Attending: Urology | Admitting: Urology

## 2020-08-09 ENCOUNTER — Other Ambulatory Visit: Payer: Self-pay

## 2020-08-09 DIAGNOSIS — Z20822 Contact with and (suspected) exposure to covid-19: Secondary | ICD-10-CM | POA: Insufficient documentation

## 2020-08-09 DIAGNOSIS — Z01812 Encounter for preprocedural laboratory examination: Secondary | ICD-10-CM | POA: Insufficient documentation

## 2020-08-09 LAB — SARS CORONAVIRUS 2 (TAT 6-24 HRS): SARS Coronavirus 2: NEGATIVE

## 2020-08-13 ENCOUNTER — Ambulatory Visit: Payer: 59 | Admitting: Anesthesiology

## 2020-08-13 ENCOUNTER — Other Ambulatory Visit: Payer: Self-pay

## 2020-08-13 ENCOUNTER — Encounter: Payer: Self-pay | Admitting: Urology

## 2020-08-13 ENCOUNTER — Ambulatory Visit
Admission: RE | Admit: 2020-08-13 | Discharge: 2020-08-13 | Disposition: A | Discharge status: Home / Self Care | Payer: 59 | Source: Ambulatory Visit | Attending: Urology | Admitting: Urology

## 2020-08-13 ENCOUNTER — Ambulatory Visit: Payer: 59

## 2020-08-13 ENCOUNTER — Encounter
Admission: RE | Disposition: A | Discharge status: Home / Self Care | Payer: Self-pay | Source: Ambulatory Visit | Attending: Urology

## 2020-08-13 DIAGNOSIS — N1330 Unspecified hydronephrosis: Secondary | ICD-10-CM | POA: Diagnosis not present

## 2020-08-13 DIAGNOSIS — Z8051 Family history of malignant neoplasm of kidney: Secondary | ICD-10-CM | POA: Insufficient documentation

## 2020-08-13 DIAGNOSIS — N132 Hydronephrosis with renal and ureteral calculous obstruction: Secondary | ICD-10-CM | POA: Diagnosis not present

## 2020-08-13 DIAGNOSIS — Z79899 Other long term (current) drug therapy: Secondary | ICD-10-CM | POA: Insufficient documentation

## 2020-08-13 DIAGNOSIS — N201 Calculus of ureter: Secondary | ICD-10-CM

## 2020-08-13 DIAGNOSIS — K7460 Unspecified cirrhosis of liver: Secondary | ICD-10-CM | POA: Diagnosis not present

## 2020-08-13 DIAGNOSIS — N202 Calculus of kidney with calculus of ureter: Secondary | ICD-10-CM | POA: Diagnosis not present

## 2020-08-13 HISTORY — PX: CYSTOSCOPY/URETEROSCOPY/HOLMIUM LASER/STENT PLACEMENT: SHX6546

## 2020-08-13 SURGERY — CYSTOSCOPY/URETEROSCOPY/HOLMIUM LASER/STENT PLACEMENT
Anesthesia: General | Laterality: Left

## 2020-08-13 MED ORDER — FENTANYL CITRATE (PF) 100 MCG/2ML IJ SOLN
INTRAMUSCULAR | Status: AC
  Filled 2020-08-13: qty 2

## 2020-08-13 MED ORDER — CHLORHEXIDINE GLUCONATE 0.12 % MT SOLN
OROMUCOSAL | Status: AC
  Administered 2020-08-13: 15 mL via OROMUCOSAL
  Filled 2020-08-13: qty 15

## 2020-08-13 MED ORDER — FENTANYL CITRATE (PF) 100 MCG/2ML IJ SOLN: 25.0000 ug | INTRAMUSCULAR | Status: DC | PRN

## 2020-08-13 MED ORDER — DEXAMETHASONE SODIUM PHOSPHATE 10 MG/ML IJ SOLN
INTRAMUSCULAR | Status: DC | PRN
  Administered 2020-08-13: 10 mg via INTRAVENOUS

## 2020-08-13 MED ORDER — HYDROCODONE-ACETAMINOPHEN 5-325 MG PO TABS
1.0000 | ORAL_TABLET | Freq: Once | ORAL | Status: AC
  Administered 2020-08-13: 1 via ORAL

## 2020-08-13 MED ORDER — PROPOFOL 10 MG/ML IV BOLUS
INTRAVENOUS | Status: DC | PRN
  Administered 2020-08-13: 150 mg via INTRAVENOUS

## 2020-08-13 MED ORDER — SUGAMMADEX SODIUM 200 MG/2ML IV SOLN
INTRAVENOUS | Status: DC | PRN
  Administered 2020-08-13: 200 mg via INTRAVENOUS

## 2020-08-13 MED ORDER — MIDAZOLAM HCL 2 MG/2ML IJ SOLN
INTRAMUSCULAR | Status: AC
  Filled 2020-08-13: qty 2

## 2020-08-13 MED ORDER — CHLORHEXIDINE GLUCONATE 0.12 % MT SOLN: 15.0000 mL | Freq: Once | OROMUCOSAL | Status: AC

## 2020-08-13 MED ORDER — HYDROCODONE-ACETAMINOPHEN 5-325 MG PO TABS
ORAL_TABLET | ORAL | Status: AC
  Filled 2020-08-13: qty 1

## 2020-08-13 MED ORDER — ONDANSETRON HCL 4 MG/2ML IJ SOLN: 4.0000 mg | Freq: Once | INTRAMUSCULAR | Status: DC | PRN

## 2020-08-13 MED ORDER — FENTANYL CITRATE (PF) 100 MCG/2ML IJ SOLN: INTRAMUSCULAR | Status: DC | PRN

## 2020-08-13 MED ORDER — OXYBUTYNIN CHLORIDE 5 MG PO TABS: 5.0000 mg | ORAL_TABLET | Freq: Three times a day (TID) | ORAL | 0 refills | Status: DC | PRN

## 2020-08-13 MED ORDER — LIDOCAINE HCL (CARDIAC) PF 100 MG/5ML IV SOSY
PREFILLED_SYRINGE | INTRAVENOUS | Status: DC | PRN
  Administered 2020-08-13: 60 mg via INTRAVENOUS

## 2020-08-13 MED ORDER — ACETAMINOPHEN 10 MG/ML IV SOLN
INTRAVENOUS | Status: AC
  Filled 2020-08-13: qty 100

## 2020-08-13 MED ORDER — LACTATED RINGERS IV SOLN: INTRAVENOUS | Status: DC

## 2020-08-13 MED ORDER — IOPAMIDOL (ISOVUE-200) INJECTION 41%
INTRAVENOUS | Status: DC | PRN
  Administered 2020-08-13: 11 mg via INTRAVENOUS

## 2020-08-13 MED ORDER — ROCURONIUM BROMIDE 100 MG/10ML IV SOLN
INTRAVENOUS | Status: DC | PRN
  Administered 2020-08-13: 50 mg via INTRAVENOUS

## 2020-08-13 MED ORDER — SUCCINYLCHOLINE CHLORIDE 200 MG/10ML IV SOSY
PREFILLED_SYRINGE | INTRAVENOUS | Status: AC
  Filled 2020-08-13: qty 10

## 2020-08-13 MED ORDER — FENTANYL CITRATE (PF) 100 MCG/2ML IJ SOLN
INTRAMUSCULAR | Status: DC | PRN
  Administered 2020-08-13: 50 ug via INTRAVENOUS

## 2020-08-13 MED ORDER — ONDANSETRON HCL 4 MG/2ML IJ SOLN
INTRAMUSCULAR | Status: DC | PRN
  Administered 2020-08-13: 4 mg via INTRAVENOUS

## 2020-08-13 MED ORDER — DEXAMETHASONE SODIUM PHOSPHATE 10 MG/ML IJ SOLN
INTRAMUSCULAR | Status: AC
  Filled 2020-08-13: qty 1

## 2020-08-13 MED ORDER — HYDROCODONE-ACETAMINOPHEN 5-325 MG PO TABS: 1.0000 | ORAL_TABLET | Freq: Four times a day (QID) | ORAL | 0 refills | Status: DC | PRN

## 2020-08-13 MED ORDER — ONDANSETRON HCL 4 MG/2ML IJ SOLN
INTRAMUSCULAR | Status: AC
  Filled 2020-08-13: qty 2

## 2020-08-13 MED ORDER — ORAL CARE MOUTH RINSE: 15.0000 mL | Freq: Once | OROMUCOSAL | Status: AC

## 2020-08-13 MED ORDER — MIDAZOLAM HCL 2 MG/2ML IJ SOLN
INTRAMUSCULAR | Status: DC | PRN
  Administered 2020-08-13: 2 mg via INTRAVENOUS

## 2020-08-13 MED ORDER — LIDOCAINE HCL (PF) 2 % IJ SOLN
INTRAMUSCULAR | Status: AC
  Filled 2020-08-13: qty 5

## 2020-08-13 MED ORDER — EPHEDRINE 5 MG/ML INJ
INTRAVENOUS | Status: AC
  Filled 2020-08-13: qty 10

## 2020-08-13 MED ORDER — ROCURONIUM BROMIDE 10 MG/ML (PF) SYRINGE
PREFILLED_SYRINGE | INTRAVENOUS | Status: AC
  Filled 2020-08-13: qty 10

## 2020-08-13 MED ORDER — CEFAZOLIN SODIUM-DEXTROSE 2-4 GM/100ML-% IV SOLN
INTRAVENOUS | Status: AC
  Filled 2020-08-13: qty 100

## 2020-08-13 MED ORDER — CEFAZOLIN SODIUM-DEXTROSE 2-4 GM/100ML-% IV SOLN
2.0000 g | Freq: Once | INTRAVENOUS | Status: AC
  Administered 2020-08-13: 2 g via INTRAVENOUS

## 2020-08-13 SURGICAL SUPPLY — 27 items
BAG DRAIN CYSTO-URO LG1000N (MISCELLANEOUS) ×2 IMPLANT
BASKET ZERO TIP 1.9FR (BASKET) IMPLANT
BRUSH SCRUB EZ 1% IODOPHOR (MISCELLANEOUS) ×2 IMPLANT
BSKT STON RTRVL ZERO TP 1.9FR (BASKET)
CATH URET FLEX-TIP 2 LUMEN 10F (CATHETERS) IMPLANT
CATH URETL 5X70 OPEN END (CATHETERS) ×2 IMPLANT
CNTNR SPEC 2.5X3XGRAD LEK (MISCELLANEOUS)
CONT SPEC 4OZ STER OR WHT (MISCELLANEOUS)
CONT SPEC 4OZ STRL OR WHT (MISCELLANEOUS)
CONTAINER SPEC 2.5X3XGRAD LEK (MISCELLANEOUS) IMPLANT
DRAPE UTILITY 15X26 TOWEL STRL (DRAPES) ×2 IMPLANT
GLOVE SURG ENC MOIS LTX SZ6.5 (GLOVE) ×2 IMPLANT
GOWN STRL REUS W/ TWL LRG LVL3 (GOWN DISPOSABLE) ×2 IMPLANT
GOWN STRL REUS W/TWL LRG LVL3 (GOWN DISPOSABLE) ×4
GUIDEWIRE GREEN .038 145CM (MISCELLANEOUS) ×2 IMPLANT
GUIDEWIRE STR DUAL SENSOR (WIRE) ×2 IMPLANT
INFUSOR MANOMETER BAG 3000ML (MISCELLANEOUS) ×2 IMPLANT
IV NS IRRIG 3000ML ARTHROMATIC (IV SOLUTION) ×2 IMPLANT
KIT TURNOVER CYSTO (KITS) ×2 IMPLANT
PACK CYSTO AR (MISCELLANEOUS) ×2 IMPLANT
SET CYSTO W/LG BORE CLAMP LF (SET/KITS/TRAYS/PACK) ×2 IMPLANT
SHEATH URETERAL 12FRX35CM (MISCELLANEOUS) ×2 IMPLANT
STENT URET 6FRX24 CONTOUR (STENTS) IMPLANT
STENT URET 6FRX26 CONTOUR (STENTS) IMPLANT
SURGILUBE 2OZ TUBE FLIPTOP (MISCELLANEOUS) ×2 IMPLANT
TRACTIP FLEXIVA PULSE ID 200 (Laser) ×2 IMPLANT
WATER STERILE IRR 1000ML POUR (IV SOLUTION) ×2 IMPLANT

## 2020-08-13 NOTE — Transfer of Care (Signed)
Immediate Anesthesia Transfer of Care Note  Patient: Ryan Willis.  Procedure(s) Performed: CYSTOSCOPY/URETEROSCOPY/HOLMIUM LASER/STENT PLACEMENT (Left )  Patient Location: PACU  Anesthesia Type:General  Level of Consciousness: awake and patient cooperative  Airway & Oxygen Therapy: Patient Spontanous Breathing and Patient connected to face mask oxygen  Post-op Assessment: Report given to RN and Post -op Vital signs reviewed and stable  Post vital signs: Reviewed and stable  Last Vitals:  Vitals Value Taken Time  BP 130/72 08/13/20 1327  Temp    Pulse 51 08/13/20 1329  Resp 13 08/13/20 1329  SpO2 100 % 08/13/20 1329  Vitals shown include unvalidated device data.  Last Pain:  Vitals:   08/13/20 1036  TempSrc: Oral  PainSc: 0-No pain         Complications: No complications documented.

## 2020-08-13 NOTE — Anesthesia Procedure Notes (Addendum)
Procedure Name: Intubation Date/Time: 08/13/2020 12:22 PM Performed by: Joellyn Quails, RN Pre-anesthesia Checklist: Patient identified, Patient being monitored, Timeout performed, Emergency Drugs available and Suction available Patient Re-evaluated:Patient Re-evaluated prior to induction Oxygen Delivery Method: Circle system utilized Preoxygenation: Pre-oxygenation with 100% oxygen Induction Type: IV induction Ventilation: Mask ventilation without difficulty Laryngoscope Size: 3 and McGraph Grade View: Grade I Tube type: Oral Tube size: 7.5 mm Number of attempts: 1 Airway Equipment and Method: Stylet Placement Confirmation: ETT inserted through vocal cords under direct vision,  positive ETCO2 and breath sounds checked- equal and bilateral Secured at: 21 cm Tube secured with: Tape Dental Injury: Teeth and Oropharynx as per pre-operative assessment

## 2020-08-13 NOTE — Op Note (Signed)
Date of procedure: 08/13/20  Preoperative diagnosis:  1. Left 5 mm UPJ stone 2. Left 6 mm lower pole stone  Postoperative diagnosis:  1. Same as above  Procedure: 1. Left ureteroscopy for left UPJ stone with laser lithotripsy 2. Left ureteroscopy, laser lithotripsy for left lower pole stone 3. Left retrograde pyelogram 4. Left ureteral stent placement 5. Interpretation of fluoroscopy less than 30 minutes  Surgeon: Hollice Espy, MD  Anesthesia: General  Complications: None  Intraoperative findings: Narrow ureter with stable stone burden on scout imaging from previous CT scan.  Very tight left proximal ureter, mucosal abrasion where the stone had been lodged within the proximal ureter with a very small amount of contrast extravasation.  All stone burden obliterated.  Stent placed without difficulty.  EBL: Minimal  Specimens: None  Drains: 6 x 24 French double-J ureteral stent on left  Indication: Ryan Willis. is a 60 y.o. patient with left proximal ureteral calculus.  After reviewing the management options for treatment, he elected to proceed with the above surgical procedure(s). We have discussed the potential benefits and risks of the procedure, side effects of the proposed treatment, the likelihood of the patient achieving the goals of the procedure, and any potential problems that might occur during the procedure or recuperation. Informed consent has been obtained.  Description of procedure:  The patient was taken to the operating room and general anesthesia was induced.  The patient was placed in the dorsal lithotomy position, prepped and draped in the usual sterile fashion, and preoperative antibiotics were administered. A preoperative time-out was performed.   A 21 French scope was advanced per urethra into the bladder.  On scout imaging, the 2 stones were easily identified on scout imaging including a 5 mm left proximal/UPJ stone as well as the 6 mm left lower pole  stone.  Retrograde pyelogram confirmed persistent mild hydronephrosis.  A sensor wire was able to place up to the level of the kidney without difficulty.  A dual-lumen access sheath was used to introduce a Super Stiff wire up to the kidney as well.  I then advanced a 35 cm Cook 12/14 ureteral access sheath up to the mid ureter.  Because the ureter was very narrow, had to use the inner lumen first and then ultimately was able to advance the entire sheath to the mid ureter confirmed fluoroscopically.  The wire and inner sheath were removed excluding a safety wire which was snapped in place and remained throughout the entirety of the procedure.  I was able to advance the scope up to level the proximal ureter where a small fragment of the stone was encountered.  I was able to irrigate this out however initially had some difficulty advancing the scope beyond where the stone was lodged.  Was quite impacted.  Under direct visualization, I readvanced a Super Stiff wire and used a railroad technique to advance the scope into the kidney.  Upon doing so, ended up pushing the proximal stone into the midpole calyx.  And then brought in at 5 m laser fiber and using dusting settings of 0.3 J and 40 Hz to fragment both the stone as well as a lower pole stone.  Each and every calyx was then directly visualized to ensure that the entirety of the stone burden was fragmented such that there was no fragments greater than the tip of the laser fiber.  Additional contrast was injected at this point in time and no contrast extravasation was seen.  The scope  was then backed into the proximal ureter.  Unfortunate, on the medial aspect of the ureter, there was a large approximately almost 1 cm abrasion which had some stone material versus dust on the surface.  I was initially concerned that this might represent some additional fragmented stone and thus the laser was brought in and a small area was laser.  This confirmed that this in fact  was just dust material and not true residual stone burden.  Upon doing so, I did notice a small amount of contrast extravasation over the previous retrograde from the ureter at this point.  It is quite small.  I backed the scope down to the mid ureter where the sheath was identified and I remove this sheath while examining the ureter along the way.  There is also another distal very superficial ureteral abrasion presumably from this sheath.  Finally, the safety wire was backloaded over rigid cystoscope.  A 6 x 24 French double-J ureteral stent was advanced up to the level of the renal pelvis.  The wire was withdrawn and a full coil was noted both within the renal pelvis as well as within the bladder.  The bladder was then drained.  The patient was then cleaned and dried, repositioned in supine position, reversed myesthesia, and taken to the PACU in stable condition.  Plan: We will plan to maintain his stent for more prolonged period of time to allow the ureter to heal.  We will take it out in 3 to 4 weeks.  Findings were discussed with his wife.  Hollice Espy, M.D.

## 2020-08-13 NOTE — Anesthesia Preprocedure Evaluation (Addendum)
Anesthesia Evaluation  Patient identified by MRN, date of birth, ID band Patient awake    Reviewed: Allergy & Precautions, H&P , NPO status , Patient's Chart, lab work & pertinent test results, reviewed documented beta blocker date and time   Airway Mallampati: III  TM Distance: >3 FB Neck ROM: full    Dental  (+) Teeth Intact   Pulmonary neg pulmonary ROS,    Pulmonary exam normal        Cardiovascular Exercise Tolerance: Good negative cardio ROS Normal cardiovascular exam Rate:Normal     Neuro/Psych negative neurological ROS  negative psych ROS   GI/Hepatic negative GI ROS, Neg liver ROS,   Endo/Other  Hypothyroidism   Renal/GU Renal disease  negative genitourinary   Musculoskeletal   Abdominal   Peds  Hematology negative hematology ROS (+)   Anesthesia Other Findings   Reproductive/Obstetrics negative OB ROS                             Anesthesia Physical Anesthesia Plan  ASA: II  Anesthesia Plan: General ETT   Post-op Pain Management:    Induction:   PONV Risk Score and Plan:   Airway Management Planned:   Additional Equipment:   Intra-op Plan:   Post-operative Plan:   Informed Consent: I have reviewed the patients History and Physical, chart, labs and discussed the procedure including the risks, benefits and alternatives for the proposed anesthesia with the patient or authorized representative who has indicated his/her understanding and acceptance.       Plan Discussed with: CRNA  Anesthesia Plan Comments:        Anesthesia Quick Evaluation

## 2020-08-13 NOTE — Discharge Instructions (Signed)
You have a ureteral stent in place.  This is a tube that extends from your kidney to your bladder.  This may cause urinary bleeding, burning with urination, and urinary frequency.  Please call our office or present to the ED if you develop fevers >101 or pain which is not able to be controlled with oral pain medications.  You may be given either Flomax and/ or ditropan to help with bladder spasms and stent pain in addition to pain medications.    [facility] [address] [place], [place] [address] [phone]

## 2020-08-13 NOTE — Interval H&P Note (Signed)
History and Physical Interval Note:  08/13/2020 11:54 AM  Ryan Willis.  has presented today for surgery, with the diagnosis of Left UPJ stone,Left Renal Stone.  The various methods of treatment have been discussed with the patient and family. After consideration of risks, benefits and other options for treatment, the patient has consented to  Procedure(s): CYSTOSCOPY/URETEROSCOPY/HOLMIUM LASER/STENT PLACEMENT (Left) as a surgical intervention.  The patient's history has been reviewed, patient examined, no change in status, stable for surgery.  I have reviewed the patient's chart and labs.  Questions were answered to the patient's satisfaction.    RRR CTAB  Hollice Espy

## 2020-08-14 NOTE — Anesthesia Postprocedure Evaluation (Signed)
Anesthesia Post Note  Patient: Ryan Willis.  Procedure(s) Performed: CYSTOSCOPY/URETEROSCOPY/HOLMIUM LASER/STENT PLACEMENT (Left )  Patient location during evaluation: PACU Anesthesia Type: General Level of consciousness: awake and alert Pain management: pain level controlled Vital Signs Assessment: post-procedure vital signs reviewed and stable Respiratory status: spontaneous breathing, nonlabored ventilation and respiratory function stable Cardiovascular status: blood pressure returned to baseline and stable Postop Assessment: no apparent nausea or vomiting Anesthetic complications: no   No complications documented.   Last Vitals:  Vitals:   08/13/20 1418 08/13/20 1500  BP: (!) 152/70 (!) 149/71  Pulse: (!) 52 (!) 57  Resp: 16 16  Temp: (!) 36.2 C   SpO2: 96% 97%    Last Pain:  Vitals:   08/13/20 1418  TempSrc: Temporal  PainSc: [place]

## 2020-08-17 ENCOUNTER — Telehealth: Payer: Self-pay

## 2020-08-17 NOTE — Telephone Encounter (Signed)
Incoming call from pt on triage line who states that he questions of gross hematuria is still normal 4 days post op. Per Dr.Brandon's op note advised pt that the left ureter was very tight stone was lodged therefore blood in the urine will be normal for an extended length of time. Advised to push fluids. Pt denies pain, n/v, chills, or fever. Advised to call back should symptoms change or worsen. Pt voiced understanding.

## 2020-08-20 ENCOUNTER — Other Ambulatory Visit: Payer: Self-pay | Admitting: Urology

## 2020-08-23 NOTE — Progress Notes (Signed)
Pt  Needs to schedule physical appt with provider. CL,RMA

## 2020-08-24 ENCOUNTER — Other Ambulatory Visit: Payer: Self-pay

## 2020-08-24 ENCOUNTER — Ambulatory Visit: Payer: Self-pay

## 2020-08-24 DIAGNOSIS — Z0000 Encounter for general adult medical examination without abnormal findings: Secondary | ICD-10-CM

## 2020-08-24 LAB — POCT URINALYSIS DIPSTICK
Bilirubin, UA: NEGATIVE
Blood, UA: POSITIVE
Glucose, UA: NEGATIVE
Ketones, UA: NEGATIVE
Nitrite, UA: NEGATIVE
Protein, UA: POSITIVE — AB
Spec Grav, UA: 1.02 (ref 1.010–1.025)
Urobilinogen, UA: 1 E.U./dL
pH, UA: 6 (ref 5.0–8.0)

## 2020-08-24 NOTE — Progress Notes (Signed)
Scheduled to complete physical 08/31/20 with Letitia Neri, PA-C.  AMD

## 2020-08-25 LAB — CMP12+LP+TP+TSH+6AC+PSA+CBC…
ALT: 16 IU/L (ref 0–44)
AST: 28 IU/L (ref 0–40)
Albumin/Globulin Ratio: 1.6 (ref 1.2–2.2)
Albumin: 4.2 g/dL (ref 3.8–4.9)
Alkaline Phosphatase: 78 IU/L (ref 44–121)
BUN/Creatinine Ratio: 11 (ref 10–24)
BUN: 10 mg/dL (ref 8–27)
Basophils Absolute: 0 10*3/uL (ref 0.0–0.2)
Basos: 1 %
Bilirubin Total: 1.7 mg/dL — ABNORMAL HIGH (ref 0.0–1.2)
Calcium: 9.3 mg/dL (ref 8.6–10.2)
Chloride: 105 mmol/L (ref 96–106)
Chol/HDL Ratio: 2.8 ratio (ref 0.0–5.0)
Cholesterol, Total: 127 mg/dL (ref 100–199)
Creatinine, Ser: 0.88 mg/dL (ref 0.76–1.27)
EOS (ABSOLUTE): 0.1 10*3/uL (ref 0.0–0.4)
Eos: 4 %
Estimated CHD Risk: <0.5 times avg. (ref 0.0–1.0)
Free Thyroxine Index: 2.3 (ref 1.2–4.9)
GGT: 27 IU/L (ref 0–65)
Globulin, Total: 2.6 g/dL (ref 1.5–4.5)
Glucose: 95 mg/dL (ref 65–99)
HDL: 45 mg/dL (ref >39)
Hematocrit: 36.4 % — ABNORMAL LOW (ref 37.5–51.0)
Hemoglobin: 13.1 g/dL (ref 13.0–17.7)
Immature Grans (Abs): 0 10*3/uL (ref 0.0–0.1)
Immature Granulocytes: 0 %
Iron: 198 ug/dL — ABNORMAL HIGH (ref 38–169)
LDH: 147 IU/L (ref 121–224)
LDL Chol Calc (NIH): 66 mg/dL (ref 0–99)
Lymphocytes Absolute: 0.6 10*3/uL — ABNORMAL LOW (ref 0.7–3.1)
Lymphs: 24 %
MCH: 34.6 pg — ABNORMAL HIGH (ref 26.6–33.0)
MCHC: 36 g/dL — ABNORMAL HIGH (ref 31.5–35.7)
MCV: 96 fL (ref 79–97)
Monocytes Absolute: 0.2 10*3/uL (ref 0.1–0.9)
Monocytes: 9 %
Neutrophils Absolute: 1.6 10*3/uL (ref 1.4–7.0)
Neutrophils: 62 %
Phosphorus: 3.2 mg/dL (ref 2.8–4.1)
Platelets: 64 10*3/uL — CL (ref 150–450)
Potassium: 4.4 mmol/L (ref 3.5–5.2)
Prostate Specific Ag, Serum: 1.2 ng/mL (ref 0.0–4.0)
RBC: 3.79 x10E6/uL — ABNORMAL LOW (ref 4.14–5.80)
RDW: 13 % (ref 11.6–15.4)
Sodium: 140 mmol/L (ref 134–144)
T3 Uptake Ratio: 26 % (ref 24–39)
T4, Total: 8.9 ug/dL (ref 4.5–12.0)
TSH: 3.44 u[IU]/mL (ref 0.450–4.500)
Total Protein: 6.8 g/dL (ref 6.0–8.5)
Triglycerides: 83 mg/dL (ref 0–149)
Uric Acid: 4.7 mg/dL (ref 3.8–8.4)
VLDL Cholesterol Cal: 16 mg/dL (ref 5–40)
WBC: 2.6 10*3/uL — ABNORMAL LOW (ref 3.4–10.8)
eGFR: 98 mL/min/{1.73_m2} (ref >59)

## 2020-08-31 ENCOUNTER — Encounter: Payer: Self-pay | Admitting: Emergency Medicine

## 2020-08-31 ENCOUNTER — Other Ambulatory Visit: Payer: Self-pay

## 2020-08-31 ENCOUNTER — Ambulatory Visit: Payer: Self-pay | Admitting: Emergency Medicine

## 2020-08-31 VITALS — BP 125/61 | HR 48 | Temp 98.3°F | Resp 12 | Ht 66.0 in | Wt 178.0 lb

## 2020-08-31 DIAGNOSIS — Z0000 Encounter for general adult medical examination without abnormal findings: Secondary | ICD-10-CM

## 2020-08-31 NOTE — Progress Notes (Signed)
I have reviewed the triage vital signs and the nursing notes.   HISTORY  Chief Complaint Annual Exam HPI Ryan Willis. is a 60 y.o. male is here for an annual physical.  Patient still currently follows up with his doctor at the cancer center for his thrombocytopenia.  Patient currently has a stent from his last kidney stone.  Otherwise he feels extremely good is planning to ride his motorcycle possibly across country.       Past Medical History:  Diagnosis Date  . Cirrhosis of liver ([place])   . Diverticulitis   . Family history of adverse reaction to anesthesia    MOTHER HAD N/V  . History of kidney stones   . Hypercholesteremia   . Hypothyroidism   . Peyronie's disease   . Renal cyst, left   . Seasonal allergies   . Thrombocytopenia [facility])     Patient Active Problem List   Diagnosis Date Noted  . S/P cholecystectomy 04/26/2019  . Cholecystitis 04/19/2019  . Acute cholecystitis 01/25/2019  . Splenomegaly 07/07/2017  . Thrombocytopenia ([place]) 06/23/2017  . Cirrhosis ([place]) 06/23/2017    Past Surgical History:  Procedure Laterality Date  . CHOLECYSTECTOMY    . COLONOSCOPY  2006  . COLONOSCOPY WITH PROPOFOL N/A 09/08/2017   Procedure: COLONOSCOPY WITH PROPOFOL;  Surgeon: [name], MD;  Location: [facility] ENDOSCOPY;  Service: Gastroenterology;  Laterality: N/A;  . COLONOSCOPY WITH PROPOFOL N/A 01/01/2018   Procedure: COLONOSCOPY WITH PROPOFOL;  Surgeon: [name], MD;  Location: [facility] ENDOSCOPY;  Service: Gastroenterology;  Laterality: N/A;  . CYSTOSCOPY/URETEROSCOPY/HOLMIUM LASER/STENT PLACEMENT Right 01/17/2019   Procedure: CYSTOSCOPY/URETEROSCOPY/STENT PLACEMENT;  Surgeon: Hollice Espy, MD;  Location: [facility] ORS;  Service: Urology;  Laterality: Right;  . CYSTOSCOPY/URETEROSCOPY/HOLMIUM LASER/STENT PLACEMENT Right 01/31/2019   Procedure: CYSTOSCOPY/URETEROSCOPY/HOLMIUM LASER/STENT EXCHANGE;  Surgeon: Hollice Espy, MD;  Location: [facility] ORS;  Service: Urology;  Laterality:  Right;  . CYSTOSCOPY/URETEROSCOPY/HOLMIUM LASER/STENT PLACEMENT Left 08/13/2020   Procedure: CYSTOSCOPY/URETEROSCOPY/HOLMIUM LASER/STENT PLACEMENT;  Surgeon: Hollice Espy, MD;  Location: [facility] ORS;  Service: Urology;  Laterality: Left;  . ESOPHAGOGASTRODUODENOSCOPY (EGD) WITH PROPOFOL N/A 09/08/2017   Procedure: ESOPHAGOGASTRODUODENOSCOPY (EGD) WITH PROPOFOL;  Surgeon: [name], MD;  Location: [facility] ENDOSCOPY;  Service: Gastroenterology;  Laterality: N/A;  . ESOPHAGOGASTRODUODENOSCOPY (EGD) WITH PROPOFOL N/A 05/31/2020   Procedure: ESOPHAGOGASTRODUODENOSCOPY (EGD) WITH PROPOFOL;  Surgeon: [name], MD;  Location: [facility] ENDOSCOPY;  Service: Gastroenterology;  Laterality: N/A;  . HERNIA REPAIR Right 1981   inguinal  . INTRAOPERATIVE CHOLANGIOGRAM N/A 04/19/2019   Procedure: INTRAOPERATIVE CHOLANGIOGRAM;  Surgeon: Jules Husbands, MD;  Location: [facility] ORS;  Service: General;  Laterality: N/A;  . SHOULDER SURGERY Right 2001   ROTATOR CUFF REPAIR  . VENTRAL HERNIA REPAIR N/A 10/27/2019   Procedure: HERNIA REPAIR VENTRAL ADULT, Open;  Surgeon: Jules Husbands, MD;  Location: [facility] ORS;  Service: General;  Laterality: N/A;    Prior to Admission medications   Medication Sig Start Date End Date Taking? Authorizing Provider  nadolol (CORGARD) 20 MG tablet Take 1 tablet (20 mg total) by mouth daily. 05/31/20 05/31/21 Yes [name], MD  tamsulosin (FLOMAX) 0.4 MG CAPS capsule Take 1 capsule (0.4 mg total) by mouth daily. 07/16/20  Yes McGowan, Larene [place] A, PA-C  HYDROcodone-acetaminophen (NORCO/VICODIN) 5-325 MG tablet Take 1-2 tablets by mouth every 6 (six) hours as needed for moderate pain. Patient not taking: Reported on 08/31/2020 08/13/20   Hollice Espy, MD  lactulose [facility]) 10 GM/15ML solution Take 15 mLs (10 g total) by mouth 2 (two) times daily  as needed for moderate constipation. Patient not taking: Reported on 08/31/2020 07/23/20 01/19/21  [name], MD  Multiple Vitamin (MULTIVITAMIN WITH MINERALS)  TABS tablet Take 1 tablet by mouth daily. Patient not taking: Reported on 08/31/2020    [provider]  oxybutynin (DITROPAN) 5 MG tablet TAKE 1 TABLET(5 MG) BY MOUTH EVERY 8 HOURS AS NEEDED FOR BLADDER SPASMS Patient not taking: Reported on 08/31/2020 08/20/20   Zara Council A, PA-C  oxyCODONE (ROXICODONE) 5 MG immediate release tablet Take 1-2 tablets by mouth every 6 hours as needed for moderate to severe pain. Patient not taking: No sig reported 07/15/20   Hinda Kehr, MD    Allergies Patient has no known allergies.  Family History  Problem Relation Age of Onset  . Kidney cancer Mother   . Lung cancer Mother   . Colon cancer Father     Social History Social History   Tobacco Use  . Smoking status: Never Smoker  . Smokeless tobacco: Never Used  Vaping Use  . Vaping Use: Never used  Substance Use Topics  . Alcohol use: Not Currently    Comment: "1 or 2 year drinks a year."  . Drug use: No    Review of Systems Constitutional: No fever/chills Eyes: No visual changes. ENT: No sore throat. Cardiovascular: Denies chest pain. Respiratory: Denies shortness of breath. Gastrointestinal: No abdominal pain.  No nausea, no vomiting.  No diarrhea. Genitourinary: History of kidney stones. Musculoskeletal: Negative for  musculoskeletal pain. Skin: Negative for rash. Neurological: Negative for headaches, focal weakness or numbness. Hematological/Lymphatic:  History of thrombocytopenia.  ____________________________________________   PHYSICAL EXAM:  VITAL SIGNS: Constitutional: Alert and oriented. Well appearing and in no acute distress. Eyes: Conjunctivae are normal.  Head: Atraumatic. Nose: No congestion/rhinnorhea. Neck: No stridor.  Cardiovascular: Normal rate, regular rhythm. Grossly normal heart sounds.  Good peripheral circulation. Respiratory: Normal respiratory effort.  No retractions. Lungs CTAB. Gastrointestinal: Soft and nontender. No distention.  Bowel  sounds normoactive x4 quadrants.  Patient has a right upper quadrant surgical scar well-healed. Musculoskeletal: Moves upper and lower extremities without any difficulty.  Normal gait was noted.  No point tenderness on palpation of joints. Neurologic:  Normal speech and language. No gross focal neurologic deficits are appreciated. No gait instability. Skin:  Skin is warm, dry and intact. No rash noted. Psychiatric: Mood and affect are normal. Speech and behavior are normal.  ____________________________________________   LABS (all labs ordered are listed, but only abnormal results are displayed)  Labs were discussed with patient. ____________________________________________  EKG  Sinus bradycardia with ventricular rate of 50 ____________________________________________  FINAL CLINICAL IMPRESSION(S) / ED DIAGNOSES  Normal physical exam.   ED Discharge Orders    None       Note:  This document was prepared using Dragon voice recognition software and may include unintentional dictation errors.

## 2020-08-31 NOTE — Progress Notes (Signed)
Urologist to remove stent on 09/05/20 - still has pieces of kidney stone coming out. S/P Lithotripsy  Sees Dr. Burlene Arnt at [facility] for low platelets.  AMD

## 2020-09-01 ENCOUNTER — Other Ambulatory Visit: Payer: Self-pay | Admitting: Urology

## 2020-09-05 ENCOUNTER — Ambulatory Visit (INDEPENDENT_AMBULATORY_CARE_PROVIDER_SITE_OTHER): Payer: 59 | Admitting: Urology

## 2020-09-05 ENCOUNTER — Encounter: Payer: Self-pay | Admitting: Urology

## 2020-09-05 VITALS — BP 136/77 | HR 54 | Ht 66.0 in | Wt 180.0 lb

## 2020-09-05 DIAGNOSIS — N200 Calculus of kidney: Secondary | ICD-10-CM | POA: Diagnosis not present

## 2020-09-05 MED ORDER — SULFAMETHOXAZOLE-TRIMETHOPRIM 800-160 MG PO TABS
1.0000 | ORAL_TABLET | Freq: Once | ORAL | Status: AC
  Administered 2020-09-05: 1 via ORAL

## 2020-09-05 NOTE — Progress Notes (Signed)
   09/05/20  CC:  Chief Complaint  Patient presents with  . Cysto Stent Removal    HPI: 60-year-old male with left proximal ureteral calculus status post ESWL who presents today for stent removal.  Procedures performed on 08/13/2020.  Notably, the left proximal ureter was very narrow and at the end of the case, there is a very small wisp of contrast extravasation.  As such, the stent was left for prolonged course.  He presents today for stent removal.  He is not had any issues.  There were no vitals taken for this visit. NED. A&Ox3.   No respiratory distress   Abd soft, NT, ND Normal phallus with bilateral descended testicles  Cystoscopy/ Stent removal procedure  Patient identification was confirmed, informed consent was obtained, and patient was prepped using Betadine solution.  Lidocaine jelly was administered per urethral meatus.    Preoperative abx where received prior to procedure.    Procedure: - Flexible cystoscope introduced, without any difficulty.   - Thorough search of the bladder revealed:    normal urethral meatus  Stent seen emanating from left ureteral orifice, grasped with stent graspers, and removed in entirety.     Post-Procedure: - Patient tolerated the procedure well   Assessment/ Plan:  1. Left renal stone Ureteral stent removed today without complication  Warning symptoms reviewed  Follow-up in 4 weeks with renal ultrasound prior   Hollice Espy, MD

## 2020-09-06 LAB — URINALYSIS, COMPLETE
Bilirubin, UA: NEGATIVE
Glucose, UA: NEGATIVE
Ketones, UA: NEGATIVE
Nitrite, UA: NEGATIVE
Protein,UA: NEGATIVE
Specific Gravity, UA: 1.01 (ref 1.005–1.030)
Urobilinogen, Ur: 1 mg/dL (ref 0.2–1.0)
pH, UA: 7 (ref 5.0–7.5)

## 2020-09-06 LAB — MICROSCOPIC EXAMINATION: Bacteria, UA: NONE SEEN

## 2020-09-10 ENCOUNTER — Other Ambulatory Visit: Payer: Self-pay | Admitting: Urology

## 2020-09-13 DIAGNOSIS — D696 Thrombocytopenia, unspecified: Secondary | ICD-10-CM | POA: Diagnosis not present

## 2020-09-13 DIAGNOSIS — K7460 Unspecified cirrhosis of liver: Secondary | ICD-10-CM | POA: Diagnosis not present

## 2020-09-14 ENCOUNTER — Encounter: Payer: Self-pay | Admitting: Internal Medicine

## 2020-09-17 ENCOUNTER — Encounter: Payer: Self-pay | Admitting: Internal Medicine

## 2020-09-17 ENCOUNTER — Inpatient Hospital Stay: Payer: 59 | Attending: Internal Medicine | Admitting: Internal Medicine

## 2020-09-17 DIAGNOSIS — R161 Splenomegaly, not elsewhere classified: Secondary | ICD-10-CM | POA: Insufficient documentation

## 2020-09-17 DIAGNOSIS — D696 Thrombocytopenia, unspecified: Secondary | ICD-10-CM | POA: Insufficient documentation

## 2020-09-17 DIAGNOSIS — K7460 Unspecified cirrhosis of liver: Secondary | ICD-10-CM | POA: Diagnosis not present

## 2020-09-17 NOTE — Progress Notes (Signed)
Pt states that nadolol makes him feel sick to his stomach so he tends to take it with an ice cream sandwich. Also,, tamsulosin makes his legs feel weak so he stopped taking it as well.

## 2020-09-17 NOTE — Progress Notes (Signed)
[facility] CONSULT NOTE  Patient Care Team: Patient, No Pcp Per (Inactive) as PCP - General (General Practice)  CHIEF COMPLAINTS/PURPOSE OF CONSULTATION: Thrombocytopenia  # THROMBOCYTOPENIA [97-108; incidental]; Hep B/hepC/HIV-NEG [[facility]] sec to cirrhosis/ splenomegaly  # Cirrhosis/ SPLENOMEGALY[US- 1000cc; CT A/P march 2019] s/p EGD/colo [may 2019- Dr.Anna]  #October 2020-kidney stones s/p stenting [Dr. Erlene Quan; gallstone/cholecystitis-s/p cholecystectomy December 2020]  # Peyronie disease-   Oncology History   No history exists.   HISTORY OF PRESENTING ILLNESS:  Ryan Willis. 60 y.o.  male cirrhosis/splenomegaly and thrombocytopenia is here for follow-up.  In the interim patient underwent kidney stone extraction; he had some bleeding post surgery.  Currently resolved.  Denies any blood in stools black or stools.  Denies any nausea vomiting abdominal pain.  Review of Systems  Constitutional: Positive for weight loss. Negative for chills, diaphoresis, fever and malaise/fatigue.  HENT: Negative for nosebleeds and sore throat.   Eyes: Negative for double vision.  Respiratory: Negative for cough, hemoptysis, sputum production, shortness of breath and wheezing.   Cardiovascular: Negative for chest pain, palpitations, orthopnea and leg swelling.  Gastrointestinal: Negative for abdominal pain, blood in stool, constipation, diarrhea, heartburn, melena, nausea and vomiting.  Genitourinary: Negative for dysuria, frequency and urgency.  Musculoskeletal: Negative for back pain and joint pain.  Skin: Negative.  Negative for itching and rash.  Neurological: Negative for dizziness, tingling, focal weakness, weakness and headaches.  Endo/Heme/Allergies: Bruises/bleeds easily.  Psychiatric/Behavioral: Negative for depression. The patient is not nervous/anxious and does not have insomnia.     MEDICAL HISTORY:  Past Medical History:  Diagnosis Date  . Cirrhosis of liver  ([place])   . Diverticulitis   . Family history of adverse reaction to anesthesia    MOTHER HAD N/V  . History of kidney stones   . Hypercholesteremia   . Hypothyroidism   . Peyronie's disease   . Renal cyst, left   . Seasonal allergies   . Thrombocytopenia ([place])     SURGICAL HISTORY: Past Surgical History:  Procedure Laterality Date  . CHOLECYSTECTOMY    . COLONOSCOPY  2006  . COLONOSCOPY WITH PROPOFOL N/A 09/08/2017   Procedure: COLONOSCOPY WITH PROPOFOL;  Surgeon: [name], MD;  Location: [facility] ENDOSCOPY;  Service: Gastroenterology;  Laterality: N/A;  . COLONOSCOPY WITH PROPOFOL N/A 01/01/2018   Procedure: COLONOSCOPY WITH PROPOFOL;  Surgeon: [name], MD;  Location: [facility] ENDOSCOPY;  Service: Gastroenterology;  Laterality: N/A;  . CYSTOSCOPY/URETEROSCOPY/HOLMIUM LASER/STENT PLACEMENT Right 01/17/2019   Procedure: CYSTOSCOPY/URETEROSCOPY/STENT PLACEMENT;  Surgeon: Hollice Espy, MD;  Location: [facility] ORS;  Service: Urology;  Laterality: Right;  . CYSTOSCOPY/URETEROSCOPY/HOLMIUM LASER/STENT PLACEMENT Right 01/31/2019   Procedure: CYSTOSCOPY/URETEROSCOPY/HOLMIUM LASER/STENT EXCHANGE;  Surgeon: Hollice Espy, MD;  Location: [facility] ORS;  Service: Urology;  Laterality: Right;  . CYSTOSCOPY/URETEROSCOPY/HOLMIUM LASER/STENT PLACEMENT Left 08/13/2020   Procedure: CYSTOSCOPY/URETEROSCOPY/HOLMIUM LASER/STENT PLACEMENT;  Surgeon: Hollice Espy, MD;  Location: [facility] ORS;  Service: Urology;  Laterality: Left;  . ESOPHAGOGASTRODUODENOSCOPY (EGD) WITH PROPOFOL N/A 09/08/2017   Procedure: ESOPHAGOGASTRODUODENOSCOPY (EGD) WITH PROPOFOL;  Surgeon: [name], MD;  Location: [facility] ENDOSCOPY;  Service: Gastroenterology;  Laterality: N/A;  . ESOPHAGOGASTRODUODENOSCOPY (EGD) WITH PROPOFOL N/A 05/31/2020   Procedure: ESOPHAGOGASTRODUODENOSCOPY (EGD) WITH PROPOFOL;  Surgeon: [name], MD;  Location: [facility] ENDOSCOPY;  Service: Gastroenterology;  Laterality: N/A;  . HERNIA REPAIR Right 1981   inguinal  .  INTRAOPERATIVE CHOLANGIOGRAM N/A 04/19/2019   Procedure: INTRAOPERATIVE CHOLANGIOGRAM;  Surgeon: Jules Husbands, MD;  Location: [facility] ORS;  Service: General;  Laterality: N/A;  . SHOULDER SURGERY  Right 2001   ROTATOR CUFF REPAIR  . VENTRAL HERNIA REPAIR N/A 10/27/2019   Procedure: HERNIA REPAIR VENTRAL ADULT, Open;  Surgeon: Jules Husbands, MD;  Location: [facility] ORS;  Service: General;  Laterality: N/A;    SOCIAL HISTORY:  Social History   Socioeconomic History  . Marital status: Married    Spouse name: Not on file  . Number of children: Not on file  . Years of education: Not on file  . Highest education level: Not on file  Occupational History  . Not on file  Tobacco Use  . Smoking status: Never Smoker  . Smokeless tobacco: Never Used  Vaping Use  . Vaping Use: Never used  Substance and Sexual Activity  . Alcohol use: Not Currently    Comment: "1 or 2 year drinks a year."  . Drug use: No  . Sexual activity: Not on file  Other Topics Concern  . Not on file  Social History Narrative   retd. Engineer, structural; part time information specialist; never smoked; no alcohol; live in [place]. Lives with wife.    Social Determinants of Health   Financial Resource Strain: Not on file  Food Insecurity: Not on file  Transportation Needs: Not on file  Physical Activity: Not on file  Stress: Not on file  Social Connections: Not on file  Intimate Partner Violence: Not on file    FAMILY HISTORY: colon father- 58 year; mother- kidney cancer; died of lung cancer [2018] Family History  Problem Relation Age of Onset  . Kidney cancer Mother   . Lung cancer Mother   . Colon cancer Father     ALLERGIES:  has No Known Allergies.  MEDICATIONS:  Current Outpatient Medications  Medication Sig Dispense Refill  . Multiple Vitamin (MULTIVITAMIN WITH MINERALS) TABS tablet Take 1 tablet by mouth daily.    . nadolol (CORGARD) 20 MG tablet Take 1 tablet (20 mg total) by mouth daily. 90 tablet 3  .  lactulose (CHRONULAC) 10 GM/15ML solution Take 15 mLs (10 g total) by mouth 2 (two) times daily as needed for moderate constipation. (Patient not taking: Reported on 09/17/2020) 2700 mL 1  . tamsulosin (FLOMAX) 0.4 MG CAPS capsule Take 1 capsule (0.4 mg total) by mouth daily. (Patient not taking: Reported on 09/17/2020) 30 capsule 3   No current facility-administered medications for this visit.   PHYSICAL EXAMINATION: ECOG PERFORMANCE STATUS: 0 - Asymptomatic  Vitals:   09/17/20 1022  BP: 118/72  Pulse: (!) 57  Resp: 16  Temp: 98.2 F (36.8 C)  SpO2: 94%   Filed Weights   09/17/20 1022  Weight: 182 lb 3.2 oz (82.6 kg)   Physical Exam HENT:     Head: Normocephalic and atraumatic.     Mouth/Throat:     Pharynx: No oropharyngeal exudate.  Eyes:     Pupils: Pupils are equal, round, and reactive to light.  Cardiovascular:     Rate and Rhythm: Normal rate and regular rhythm.  Pulmonary:     Effort: No respiratory distress.     Breath sounds: No wheezing.  Abdominal:     General: Bowel sounds are normal. There is no distension.     Palpations: Abdomen is soft. There is no mass.     Tenderness: There is no abdominal tenderness. There is no guarding or rebound.  Musculoskeletal:        General: No tenderness. Normal range of motion.     Cervical back: Normal range of motion and neck supple.  Skin:    General: Skin is warm.  Neurological:     Mental Status: He is alert and oriented to person, place, and time.  Psychiatric:        Mood and Affect: Affect normal.      LABORATORY DATA:  I have reviewed the data as listed Lab Results  Component Value Date   WBC 2.6 (L) 08/24/2020   HGB 13.1 08/24/2020   HCT 36.4 (L) 08/24/2020   MCV 96 08/24/2020   PLT 64 (LL) 08/24/2020   Recent Labs    10/10/19 1222 04/23/20 1018 06/25/20 1347 07/14/20 2111 08/24/20 0929  NA 141  --  143 140 140  K 4.7  --  4.4 4.6 4.4  CL 106  --  107* 112* 105  CO2 26  --  23 22  --    GLUCOSE 95  --  74 144* 95  BUN 11  --  12 19 10   CREATININE 0.94   < > 0.80 1.03 0.88  CALCIUM 9.6  --  9.3 9.1 9.3  GFRNONAA >60  --   --  >60  --   GFRAA >60  --   --   --   --   PROT 7.5  --  6.5 6.4* 6.8  ALBUMIN 4.3  --  4.0 3.8 4.2  AST 33  --  34 37 28  ALT 24  --  21 23 16   ALKPHOS 82  --  81 67 78  BILITOT 1.9*  --  1.4* 1.5* 1.7*  BILIDIR  --   --   --  0.3*  --   IBILI  --   --   --  1.2*  --    < > = values in this interval not displayed.    RADIOGRAPHIC STUDIES: I have personally reviewed the radiological images as listed and agreed with the findings in the report. No results found.  ASSESSMENT & PLAN:   Thrombocytopenia ([place]) # Thrombocytopenia/splenomegaly-Hypersplenism- [PPIRJJO 2019 platelets- between 106-97]APRIL-May 2022- platelets 45-60. Trending down but overall clinically STABLE;   Asymptomatic.  # Cirrhosis/splenomegaly-MRI in May 2020; Oct 2021 CT A/P [Dr.Anna]GI-AFP-10 [n-8.3]; monitor for now. Defer to Dr.Anna.   # DISPOSITION:  # follow up in 6 months; MD, labs -cbc/cmp/AFP/Pt/PTT [[facility]]; - Dr.B   All questions were answered. The patient knows to call the clinic with any problems, questions or concerns.    Cammie Sickle, MD 09/17/2020 11:01 AM

## 2020-09-17 NOTE — Assessment & Plan Note (Addendum)
#   Thrombocytopenia/splenomegaly-Hypersplenism- [January 2019 platelets- between 106-97]APRIL-May 2022- platelets 45-60. Trending down but overall clinically STABLE;   Asymptomatic.  # Cirrhosis/splenomegaly-MRI in May 2020; Oct 2021 CT A/P [Dr.Anna]GI-AFP-10 [n-8.3]; monitor for now. Defer to Dr.Anna.   # DISPOSITION:  # follow up in 6 months; MD, labs -cbc/cmp/AFP/Pt/PTT [[facility]]; - Dr.B

## 2020-10-03 ENCOUNTER — Ambulatory Visit
Admission: RE | Admit: 2020-10-03 | Discharge: 2020-10-03 | Disposition: A | Discharge status: Home / Self Care | Payer: 59 | Source: Ambulatory Visit | Attending: Gastroenterology | Admitting: Gastroenterology

## 2020-10-03 ENCOUNTER — Ambulatory Visit: Payer: Self-pay | Admitting: Urology

## 2020-10-03 ENCOUNTER — Other Ambulatory Visit: Payer: Self-pay

## 2020-10-03 DIAGNOSIS — K7460 Unspecified cirrhosis of liver: Secondary | ICD-10-CM | POA: Insufficient documentation

## 2020-10-03 DIAGNOSIS — I8500 Esophageal varices without bleeding: Secondary | ICD-10-CM

## 2020-10-10 ENCOUNTER — Ambulatory Visit
Admission: RE | Admit: 2020-10-10 | Discharge: 2020-10-10 | Disposition: A | Discharge status: Home / Self Care | Payer: 59 | Source: Ambulatory Visit | Attending: Urology | Admitting: Urology

## 2020-10-10 ENCOUNTER — Other Ambulatory Visit: Payer: Self-pay

## 2020-10-10 DIAGNOSIS — Z87442 Personal history of urinary calculi: Secondary | ICD-10-CM | POA: Diagnosis not present

## 2020-10-10 DIAGNOSIS — N200 Calculus of kidney: Secondary | ICD-10-CM | POA: Insufficient documentation

## 2020-10-10 DIAGNOSIS — N281 Cyst of kidney, acquired: Secondary | ICD-10-CM | POA: Diagnosis not present

## 2020-10-10 DIAGNOSIS — R161 Splenomegaly, not elsewhere classified: Secondary | ICD-10-CM | POA: Diagnosis not present

## 2020-10-11 ENCOUNTER — Ambulatory Visit (INDEPENDENT_AMBULATORY_CARE_PROVIDER_SITE_OTHER): Payer: 59 | Admitting: Urology

## 2020-10-11 ENCOUNTER — Encounter: Payer: Self-pay | Admitting: Urology

## 2020-10-11 VITALS — BP 128/64 | HR 48 | Ht 66.0 in | Wt 175.0 lb

## 2020-10-11 DIAGNOSIS — N201 Calculus of ureter: Secondary | ICD-10-CM | POA: Diagnosis not present

## 2020-10-11 DIAGNOSIS — N401 Enlarged prostate with lower urinary tract symptoms: Secondary | ICD-10-CM | POA: Diagnosis not present

## 2020-10-11 DIAGNOSIS — Z87442 Personal history of urinary calculi: Secondary | ICD-10-CM

## 2020-10-11 DIAGNOSIS — R3912 Poor urinary stream: Secondary | ICD-10-CM

## 2020-10-11 NOTE — Progress Notes (Signed)
10/11/2020 9:03 AM   Ryan Willis. 1960-09-25 [id number]  Referring provider: No referring provider defined for this encounter.  Chief Complaint  Patient presents with   Nephrolithiasis    HPI: 60 year old male with a personal history of kidney stones returns today for 4-week follow-up.  He was found to have a 10 mm UPJ stone along with several nonobstructing left-sided stones.  He was taken to the operating room on 08/13/2020 for ureteroscopic intervention.  Notably, the left UPJ with the stone was lodged was quite tight and a mucosal abrasion was appreciated at the end of the procedure with some mild contrast extravasation.  The stent was left for prolonged period of time and ultimately removed on 09/05/2020.  He returns today with follow-up renal ultrasound.  This shows no hydronephrosis bilaterally and bilateral ureteral jets.  Left renal cyst is appreciated.  No obvious stone burden.  Renal ultrasound images were personally reviewed today and personally interpreted as radiologic evaluation is still pending.  He reports today that he is doing well.  He has some mild dysuria for a few days after the stent was removed but he has had no further flank pain, stone fragments, or any other urinary issues.  He is doing well today.  He is trying to drink plenty of water.  He is having trouble watching his sodium.  He does have a personal history of stones and underwent right-sided ureteroscopy back in 2020.  Previous stone composition calcium oxalate.  He was taking Flomax for stent discomfort however noticed leg weakness and other side effects from this medication.  Up stopping it a week after the stent was removed.  Although he has noticed a slight decrease in his urinary stream, he is overall doing better off of this medication.  He is overall pleased with his urinary symptoms.   PMH: Past Medical History:  Diagnosis Date   Cirrhosis of liver ([place])    Diverticulitis    Family  history of adverse reaction to anesthesia    MOTHER HAD N/V   History of kidney stones    Hypercholesteremia    Hypothyroidism    Peyronie's disease    Renal cyst, left    Seasonal allergies    Thrombocytopenia ([place])     Surgical History: Past Surgical History:  Procedure Laterality Date   CHOLECYSTECTOMY     COLONOSCOPY  2006   COLONOSCOPY WITH PROPOFOL N/A 09/08/2017   Procedure: COLONOSCOPY WITH PROPOFOL;  Surgeon: [name], MD;  Location: [facility] ENDOSCOPY;  Service: Gastroenterology;  Laterality: N/A;   COLONOSCOPY WITH PROPOFOL N/A 01/01/2018   Procedure: COLONOSCOPY WITH PROPOFOL;  Surgeon: [name], MD;  Location: [facility] ENDOSCOPY;  Service: Gastroenterology;  Laterality: N/A;   CYSTOSCOPY/URETEROSCOPY/HOLMIUM LASER/STENT PLACEMENT Right 01/17/2019   Procedure: CYSTOSCOPY/URETEROSCOPY/STENT PLACEMENT;  Surgeon: Hollice Espy, MD;  Location: [facility] ORS;  Service: Urology;  Laterality: Right;   CYSTOSCOPY/URETEROSCOPY/HOLMIUM LASER/STENT PLACEMENT Right 01/31/2019   Procedure: CYSTOSCOPY/URETEROSCOPY/HOLMIUM LASER/STENT EXCHANGE;  Surgeon: Hollice Espy, MD;  Location: [facility] ORS;  Service: Urology;  Laterality: Right;   CYSTOSCOPY/URETEROSCOPY/HOLMIUM LASER/STENT PLACEMENT Left 08/13/2020   Procedure: CYSTOSCOPY/URETEROSCOPY/HOLMIUM LASER/STENT PLACEMENT;  Surgeon: Hollice Espy, MD;  Location: [facility] ORS;  Service: Urology;  Laterality: Left;   ESOPHAGOGASTRODUODENOSCOPY (EGD) WITH PROPOFOL N/A 09/08/2017   Procedure: ESOPHAGOGASTRODUODENOSCOPY (EGD) WITH PROPOFOL;  Surgeon: [name], MD;  Location: [facility] ENDOSCOPY;  Service: Gastroenterology;  Laterality: N/A;   ESOPHAGOGASTRODUODENOSCOPY (EGD) WITH PROPOFOL N/A 05/31/2020   Procedure: ESOPHAGOGASTRODUODENOSCOPY (EGD) WITH PROPOFOL;  Surgeon: [name], MD;  Location: [facility]  ENDOSCOPY;  Service: Gastroenterology;  Laterality: N/A;   HERNIA REPAIR Right 1981   inguinal   INTRAOPERATIVE CHOLANGIOGRAM N/A 04/19/2019   Procedure:  INTRAOPERATIVE CHOLANGIOGRAM;  Surgeon: Jules Husbands, MD;  Location: [facility] ORS;  Service: General;  Laterality: N/A;   SHOULDER SURGERY Right 2001   ROTATOR CUFF REPAIR   VENTRAL HERNIA REPAIR N/A 10/27/2019   Procedure: HERNIA REPAIR VENTRAL ADULT, Open;  Surgeon: Jules Husbands, MD;  Location: [facility] ORS;  Service: General;  Laterality: N/A;    Home Medications:  Allergies as of 10/11/2020   No Known Allergies      Medication List        Accurate as of October 11, 2020  9:03 AM. If you have any questions, ask your nurse or doctor.          STOP taking these medications    tamsulosin 0.4 MG Caps capsule Commonly known as: FLOMAX Stopped by: Hollice Espy, MD       TAKE these medications    lactulose 10 GM/15ML solution Commonly known as: CHRONULAC Take 15 mLs (10 g total) by mouth 2 (two) times daily as needed for moderate constipation.   multivitamin with minerals Tabs tablet Take 1 tablet by mouth daily.   nadolol 20 MG tablet Commonly known as: Corgard Take 1 tablet (20 mg total) by mouth daily.        Allergies: No Known Allergies  Family History: Family History  Problem Relation Age of Onset   Kidney cancer Mother    Lung cancer Mother    Colon cancer Father     Social History:  reports that he has never smoked. He has never used smokeless tobacco. He reports previous alcohol use. He reports that he does not use drugs.   Physical Exam: BP 128/64   Pulse (!) 48   Ht 5' 6"  (1.676 m)   Wt 175 lb (79.4 kg)   BMI 28.25 kg/m   Constitutional:  Alert and oriented, No acute distress. HEENT: [place] AT, moist mucus membranes.  Trachea midline, no masses. Cardiovascular: No clubbing, cyanosis, or edema. Respiratory: Normal respiratory effort, no increased work of breathing. Skin: No rashes, bruises or suspicious lesions. Neurologic: Grossly intact, no focal deficits, moving all 4 extremities. Psychiatric: Normal mood and affect.  Laboratory Data: Lab  Results  Component Value Date   WBC 2.6 (L) 08/24/2020   HGB 13.1 08/24/2020   HCT 36.4 (L) 08/24/2020   MCV 96 08/24/2020   PLT 64 (LL) 08/24/2020    Lab Results  Component Value Date   CREATININE 0.88 08/24/2020    Pertinent Imaging: RUS personally reviewed today   Assessment & Plan:    1. Left ureteral stone Status post complex left ureteroscopy with prolonged ureteral stent  He is doing well with stent removed with no residual hydronephrosis or stone burden bilaterally - Abdomen 1 view (KUB); Future  2. History of nephrolithiasis We discussed general stone prevention techniques including drinking plenty water with goal of producing 2.5 L urine daily, increased citric acid intake, avoidance of high oxalate containing foods, and decreased salt intake.  Information about dietary recommendations given today.  3. Benign prostatic hyperplasia with weak urinary stream Mild urinary symptoms but not able to tolerate Flomax, does not want to proceed with any other intervention at this time   Return in about 1 year (around 10/11/2021) for 1year w/KUB.  Hollice Espy, MD  [facility] [address], [place], [place] [address] [phone]  [phone]   

## 2020-10-31 ENCOUNTER — Ambulatory Visit: Payer: Self-pay | Admitting: Urology

## 2020-12-26 ENCOUNTER — Telehealth (INDEPENDENT_AMBULATORY_CARE_PROVIDER_SITE_OTHER): Payer: 59 | Admitting: Gastroenterology

## 2020-12-26 DIAGNOSIS — K5900 Constipation, unspecified: Secondary | ICD-10-CM | POA: Diagnosis not present

## 2020-12-26 DIAGNOSIS — K7460 Unspecified cirrhosis of liver: Secondary | ICD-10-CM

## 2020-12-26 DIAGNOSIS — I8500 Esophageal varices without bleeding: Secondary | ICD-10-CM | POA: Diagnosis not present

## 2020-12-26 NOTE — Progress Notes (Signed)
Ryan Willis , MD [address]  [place]  [place], [place] [address]  Main: [phone]  Fax: [phone]   Primary Care Physician: Patient, No Pcp Per (Inactive)  Virtual Visit via Video Note  I connected with patient on 12/26/20 at  9:00 AM EDT by video and verified that I am speaking with the correct person using two identifiers.   I discussed the limitations, risks, security and privacy concerns of performing an evaluation and management service by video  and the availability of in person appointments. I also discussed with the patient that there may be a patient responsible charge related to this service. The patient expressed understanding and agreed to proceed.  Location of Patient: Home Location of Provider: Home Persons involved: Patient and provider only   History of Present Illness:   C/c liver cirrhosis follow up    HPI: Darry Kelnhofer. is a 60 y.o. male  Summary of history :   H/o  liver cirrhosis secondary to NAFLD with non bleeding esophageal varices grade 1     CT scan on 07/15/17 which showed features of cirrhosis and portal hypertension with with splenomegaly, a recanalized left paraumbilical vein and small distal esophageal varices. His dad had colon cancer..    01/01/18 : colonoscopy- 3 mm tubular adenoma resected  Underwent cholecystectomy December 2021 03/19/2020 seen Dr. Rogue Bussing for thrombocytopenia.   04/23/2020: CT abdomen and pelvis: stable features of cirrhosis , moderate stool burden .  05/31/2020: EGD: Grade 1 esophageal varices. Commenced on 20 mg Nadolol.     Interval history 06/25/2020-12/26/2020  10/05/2020: RUQ USG: No liver mass    03/14/2020.  Meld score calculated 9.    NSAID's: no  Low Salt Diet :  Yes Sleep pattern/confusion/memory issues: none  Bowel  movements : on lactulose PRN- atleast 1 BM  per day  Heart rate around 60/min  No other complaints    Current Outpatient Medications  Medication Sig Dispense Refill    lactulose (CHRONULAC) 10 GM/15ML solution Take 15 mLs (10 g total) by mouth 2 (two) times daily as needed for moderate constipation. 2700 mL 1   Multiple Vitamin (MULTIVITAMIN WITH MINERALS) TABS tablet Take 1 tablet by mouth daily.     nadolol (CORGARD) 20 MG tablet Take 1 tablet (20 mg total) by mouth daily. 90 tablet 3   No current facility-administered medications for this visit.    Allergies as of 12/26/2020   (No Known Allergies)    Review of Systems:    All systems reviewed and negative except where noted in HPI.  General Appearance:    Alert, cooperative, no distress, appears stated age  Head:    Normocephalic, without obvious abnormality, atraumatic  Eyes:    PERRL, conjunctiva/corneas clear,  Ears:    Grossly normal hearing    Neurologic:  Grossly normal    Observations/Objective:  Labs: CMP     Component Value Date/Time   NA 140 08/24/2020 0929   K 4.4 08/24/2020 0929   CL 105 08/24/2020 0929   CO2 22 07/14/2020 2111   GLUCOSE 95 08/24/2020 0929   GLUCOSE 144 (H) 07/14/2020 2111   BUN 10 08/24/2020 0929   CREATININE 0.88 08/24/2020 0929   CALCIUM 9.3 08/24/2020 0929   PROT 6.8 08/24/2020 0929   ALBUMIN 4.2 08/24/2020 0929   AST 28 08/24/2020 0929   ALT 16 08/24/2020 0929   ALKPHOS 78 08/24/2020 0929   BILITOT 1.7 (H) 08/24/2020 0929   GFRNONAA >60 07/14/2020 2111  GFRAA >60 10/10/2019 1222   Lab Results  Component Value Date   WBC 2.6 (L) 08/24/2020   HGB 13.1 08/24/2020   HCT 36.4 (L) 08/24/2020   MCV 96 08/24/2020   PLT 64 (LL) 08/24/2020    Imaging Studies: No results found.  Assessment and Plan:   Blake Vetrano. is a 60 y.o. y/o male here to follow-up for liver cirrhosis, non bleeding esophageal varices secondary to NAFLD.  He is compensated and immune to hepatitis a and B.  No recent history of hepatic encephalopathy.  Meld score is 9 in 02/2020   Plan 1.  Check labs to calculate meld score 2.  Recommend COVID-19 immunization/booster dose  which he is due and has not taken yet 3.  Low-salt diet, avoid NSAID's 4.  MRI liver to screen for [facility] 03/2021 5.  EGD to screen for esophageal varices in  06/2021 6.  Lactulose : continue to take at present dose which is working well  7.  Nadolol 20 mg a day continue as his heart rate is 60 bpm-continue at present dose   Follow up in 6 months    I discussed the assessment and treatment plan with the patient. The patient was provided an opportunity to ask questions and all were answered. The patient agreed with the plan and demonstrated an understanding of the instructions.   The patient was advised to call back or seek an in-person evaluation if the symptoms worsen or if the condition fails to improve as anticipated.  I provided 20 minutes of face-to-face time during this encounter.  Dr Ryan Bellows MD,MRCP [facility]) Gastroenterology/Hepatology Pager: [phone]   Speech recognition software was used to dictate this note.

## 2021-02-04 ENCOUNTER — Encounter: Payer: Self-pay | Admitting: General Surgery

## 2021-03-05 ENCOUNTER — Ambulatory Visit: Payer: Self-pay | Admitting: Physician Assistant

## 2021-03-05 ENCOUNTER — Ambulatory Visit: Payer: Self-pay | Admitting: Urology

## 2021-03-15 ENCOUNTER — Encounter: Payer: Self-pay | Admitting: Internal Medicine

## 2021-03-18 ENCOUNTER — Inpatient Hospital Stay: Payer: 59 | Attending: Internal Medicine | Admitting: Internal Medicine

## 2021-03-18 ENCOUNTER — Encounter: Payer: Self-pay | Admitting: Internal Medicine

## 2021-03-18 ENCOUNTER — Other Ambulatory Visit: Payer: Self-pay

## 2021-03-18 ENCOUNTER — Telehealth: Payer: Self-pay | Admitting: *Deleted

## 2021-03-18 VITALS — BP 128/71 | HR 53 | Temp 97.4°F | Resp 16 | Wt 187.4 lb

## 2021-03-18 DIAGNOSIS — E039 Hypothyroidism, unspecified: Secondary | ICD-10-CM | POA: Insufficient documentation

## 2021-03-18 DIAGNOSIS — D696 Thrombocytopenia, unspecified: Secondary | ICD-10-CM | POA: Insufficient documentation

## 2021-03-18 DIAGNOSIS — K7460 Unspecified cirrhosis of liver: Secondary | ICD-10-CM | POA: Insufficient documentation

## 2021-03-18 DIAGNOSIS — R161 Splenomegaly, not elsewhere classified: Secondary | ICD-10-CM | POA: Diagnosis not present

## 2021-03-18 NOTE — Telephone Encounter (Signed)
[facility] needs AFP codes for specimens that were sent on 03/14/2021 for this patient.

## 2021-03-18 NOTE — Assessment & Plan Note (Addendum)
#  Leucopenia/ Thrombocytopenia/splenomegaly-Hypersplenism- [January 2019 platelets- between 106-97]-NOV 2022-WBC- 2.3/ANC1.5; platelets 65. Trending down but overall clinically STABLE;   Asymptomatic.  Continue surveillance.  # Cirrhosis/splenomegaly-MRI in May 2020; Oct 2021 CT A/P [Dr.Anna]GI-AFP-10 [n-8.3]; monitor for now. Defer to Dr.Anna- MRI in dec 2022.   # Vaccination: recommend the flu/ covid shots.   # DISPOSITION:  # follow up in 6 months; MD, labs -cbc/cmp/AFP/Pt/PTT [[facility]]; - Dr.B

## 2021-03-18 NOTE — Progress Notes (Signed)
Patient denies new problems/concerns today.   °

## 2021-03-18 NOTE — Progress Notes (Signed)
[facility] CONSULT NOTE  Patient Care Team: Patient, No Pcp Per (Inactive) as PCP - General (General Practice)  CHIEF COMPLAINTS/PURPOSE OF CONSULTATION: Thrombocytopenia  # THROMBOCYTOPENIA [97-108; incidental]; Hep B/hepC/HIV-NEG [[facility]] sec to cirrhosis/ splenomegaly  # Cirrhosis/ SPLENOMEGALY[US- 1000cc; CT A/P march 2019] s/p EGD/colo [may 2019- Dr.Anna]  #October 2020-kidney stones s/p stenting [Dr. Erlene Quan; gallstone/cholecystitis-s/p cholecystectomy December 2020]  # Peyronie disease-   Oncology History   No history exists.   HISTORY OF PRESENTING ILLNESS: Ambulating independently.  Alone.  Ryan Willis. 60 y.o.  male cirrhosis/splenomegaly and thrombocytopenia is here for follow-up.  Patient overall feels good.  Denies any blood in stools or black-colored stools.  Recently evaluated by GI in August.  Awaiting repeat scan for his cirrhosis [place] surveillance in December.  No gum bleeding no nosebleeds.  Review of Systems  Constitutional:  Positive for weight loss. Negative for chills, diaphoresis, fever and malaise/fatigue.  HENT:  Negative for nosebleeds and sore throat.   Eyes:  Negative for double vision.  Respiratory:  Negative for cough, hemoptysis, sputum production, shortness of breath and wheezing.   Cardiovascular:  Negative for chest pain, palpitations, orthopnea and leg swelling.  Gastrointestinal:  Negative for abdominal pain, blood in stool, constipation, diarrhea, heartburn, melena, nausea and vomiting.  Genitourinary:  Negative for dysuria, frequency and urgency.  Musculoskeletal:  Negative for back pain and joint pain.  Skin: Negative.  Negative for itching and rash.  Neurological:  Negative for dizziness, tingling, focal weakness, weakness and headaches.  Endo/Heme/Allergies:  Bruises/bleeds easily.  Psychiatric/Behavioral:  Negative for depression. The patient is not nervous/anxious and does not have insomnia.    MEDICAL HISTORY:   Past Medical History:  Diagnosis Date   Cirrhosis of liver ([place])    Diverticulitis    Family history of adverse reaction to anesthesia    MOTHER HAD N/V   History of kidney stones    Hypercholesteremia    Hypothyroidism    Peyronie's disease    Renal cyst, left    Seasonal allergies    Thrombocytopenia ([place])     SURGICAL HISTORY: Past Surgical History:  Procedure Laterality Date   CHOLECYSTECTOMY     COLONOSCOPY  2006   COLONOSCOPY WITH PROPOFOL N/A 09/08/2017   Procedure: COLONOSCOPY WITH PROPOFOL;  Surgeon: [name], MD;  Location: [facility] ENDOSCOPY;  Service: Gastroenterology;  Laterality: N/A;   COLONOSCOPY WITH PROPOFOL N/A 01/01/2018   Procedure: COLONOSCOPY WITH PROPOFOL;  Surgeon: [name], MD;  Location: [facility] ENDOSCOPY;  Service: Gastroenterology;  Laterality: N/A;   CYSTOSCOPY/URETEROSCOPY/HOLMIUM LASER/STENT PLACEMENT Right 01/17/2019   Procedure: CYSTOSCOPY/URETEROSCOPY/STENT PLACEMENT;  Surgeon: Hollice Espy, MD;  Location: [facility] ORS;  Service: Urology;  Laterality: Right;   CYSTOSCOPY/URETEROSCOPY/HOLMIUM LASER/STENT PLACEMENT Right 01/31/2019   Procedure: CYSTOSCOPY/URETEROSCOPY/HOLMIUM LASER/STENT EXCHANGE;  Surgeon: Hollice Espy, MD;  Location: [facility] ORS;  Service: Urology;  Laterality: Right;   CYSTOSCOPY/URETEROSCOPY/HOLMIUM LASER/STENT PLACEMENT Left 08/13/2020   Procedure: CYSTOSCOPY/URETEROSCOPY/HOLMIUM LASER/STENT PLACEMENT;  Surgeon: Hollice Espy, MD;  Location: [facility] ORS;  Service: Urology;  Laterality: Left;   ESOPHAGOGASTRODUODENOSCOPY (EGD) WITH PROPOFOL N/A 09/08/2017   Procedure: ESOPHAGOGASTRODUODENOSCOPY (EGD) WITH PROPOFOL;  Surgeon: [name], MD;  Location: [facility] ENDOSCOPY;  Service: Gastroenterology;  Laterality: N/A;   ESOPHAGOGASTRODUODENOSCOPY (EGD) WITH PROPOFOL N/A 05/31/2020   Procedure: ESOPHAGOGASTRODUODENOSCOPY (EGD) WITH PROPOFOL;  Surgeon: [name], MD;  Location: [facility] ENDOSCOPY;  Service: Gastroenterology;  Laterality: N/A;    HERNIA REPAIR Right 1981   inguinal   INTRAOPERATIVE CHOLANGIOGRAM N/A 04/19/2019   Procedure: INTRAOPERATIVE CHOLANGIOGRAM;  Surgeon:  Jules Husbands, MD;  Location: [facility] ORS;  Service: General;  Laterality: N/A;   SHOULDER SURGERY Right 2001   ROTATOR CUFF REPAIR   VENTRAL HERNIA REPAIR N/A 10/27/2019   Procedure: HERNIA REPAIR VENTRAL ADULT, Open;  Surgeon: Jules Husbands, MD;  Location: [facility] ORS;  Service: General;  Laterality: N/A;    SOCIAL HISTORY:  Social History   Socioeconomic History   Marital status: Married    Spouse name: Not on file   Number of children: Not on file   Years of education: Not on file   Highest education level: Not on file  Occupational History   Not on file  Tobacco Use   Smoking status: Never   Smokeless tobacco: Never  Vaping Use   Vaping Use: Never used  Substance and Sexual Activity   Alcohol use: Not Currently    Comment: "1 or 2 year drinks a year."   Drug use: No   Sexual activity: Not on file  Other Topics Concern   Not on file  Social History Narrative   retd. Engineer, structural; part time information specialist; never smoked; no alcohol; live in [place]. Lives with wife.    Social Determinants of Health   Financial Resource Strain: Not on file  Food Insecurity: Not on file  Transportation Needs: Not on file  Physical Activity: Not on file  Stress: Not on file  Social Connections: Not on file  Intimate Partner Violence: Not on file    FAMILY HISTORY: colon father- 59 year; mother- kidney cancer; died of lung cancer [2018] Family History  Problem Relation Age of Onset   Kidney cancer Mother    Lung cancer Mother    Colon cancer Father     ALLERGIES:  has No Known Allergies.  MEDICATIONS:  Current Outpatient Medications  Medication Sig Dispense Refill   lactulose (CHRONULAC) 10 GM/15ML solution Take by mouth 3 (three) times daily as needed for mild constipation.     Multiple Vitamin (MULTIVITAMIN WITH MINERALS) TABS tablet  Take 1 tablet by mouth daily.     nadolol (CORGARD) 20 MG tablet Take 1 tablet (20 mg total) by mouth daily. 90 tablet 3   No current facility-administered medications for this visit.   PHYSICAL EXAMINATION: ECOG PERFORMANCE STATUS: 0 - Asymptomatic  Vitals:   03/18/21 1026  BP: 128/71  Pulse: (!) 53  Resp: 16  Temp: (!) 97.4 F (36.3 C)   Filed Weights   03/18/21 1026  Weight: 187 lb 6.4 oz (85 kg)   Physical Exam HENT:     Head: Normocephalic and atraumatic.     Mouth/Throat:     Pharynx: No oropharyngeal exudate.  Eyes:     Pupils: Pupils are equal, round, and reactive to light.  Cardiovascular:     Rate and Rhythm: Normal rate and regular rhythm.  Pulmonary:     Effort: No respiratory distress.     Breath sounds: No wheezing.  Abdominal:     General: Bowel sounds are normal. There is no distension.     Palpations: Abdomen is soft. There is no mass.     Tenderness: There is no abdominal tenderness. There is no guarding or rebound.  Musculoskeletal:        General: No tenderness. Normal range of motion.     Cervical back: Normal range of motion and neck supple.  Skin:    General: Skin is warm.  Neurological:     Mental Status: He is alert and oriented to person,  place, and time.  Psychiatric:        Mood and Affect: Affect normal.     LABORATORY DATA:  I have reviewed the data as listed Lab Results  Component Value Date   WBC 2.6 (L) 08/24/2020   HGB 13.1 08/24/2020   HCT 36.4 (L) 08/24/2020   MCV 96 08/24/2020   PLT 64 (LL) 08/24/2020   Recent Labs    06/25/20 1347 07/14/20 2111 08/24/20 0929  NA 143 140 140  K 4.4 4.6 4.4  CL 107* 112* 105  CO2 23 22  --   GLUCOSE 74 144* 95  BUN 12 19 10   CREATININE 0.80 1.03 0.88  CALCIUM 9.3 9.1 9.3  GFRNONAA  --  >60  --   PROT 6.5 6.4* 6.8  ALBUMIN 4.0 3.8 4.2  AST 34 37 28  ALT 21 23 16   ALKPHOS 81 67 78  BILITOT 1.4* 1.5* 1.7*  BILIDIR  --  0.3*  --   IBILI  --  1.2*  --     RADIOGRAPHIC  STUDIES: I have personally reviewed the radiological images as listed and agreed with the findings in the report. No results found.  ASSESSMENT & PLAN:   Thrombocytopenia ([place]) #Leucopenia/ Thrombocytopenia/splenomegaly-Hypersplenism- [January 2019 platelets- between 106-97]-NOV 2022-WBC- 2.3/ANC1.5; platelets 65. Trending down but overall clinically STABLE;   Asymptomatic.  Continue surveillance.  # Cirrhosis/splenomegaly-MRI in May 2020; Oct 2021 CT A/P [Dr.Anna]GI-AFP-10 [n-8.3]; monitor for now. Defer to Dr.Anna- MRI in dec 2022.   # Vaccination: recommend the flu/ covid shots.   # DISPOSITION:  # follow up in 6 months; MD, labs -cbc/cmp/AFP/Pt/PTT [[facility]]; - Dr.B  All questions were answered. The patient knows to call the clinic with any problems, questions or concerns.    Cammie Sickle, MD 03/18/2021 11:27 AM

## 2021-03-18 NOTE — Telephone Encounter (Signed)
Can you call to provide the following:  Cirrhosis k74.60/splenomegaly R16.1

## 2021-03-25 ENCOUNTER — Encounter: Payer: Self-pay | Admitting: Internal Medicine

## 2021-05-13 ENCOUNTER — Other Ambulatory Visit: Payer: Self-pay

## 2021-05-13 ENCOUNTER — Telehealth: Payer: Self-pay

## 2021-05-13 DIAGNOSIS — K7460 Unspecified cirrhosis of liver: Secondary | ICD-10-CM

## 2021-05-13 DIAGNOSIS — I8500 Esophageal varices without bleeding: Secondary | ICD-10-CM

## 2021-05-13 NOTE — Telephone Encounter (Signed)
Patient called stating that Dr. Vicente Males wanted him to do an MRI in December, follow up with him in February and an EGD in March. I told him that I will schedule them and that he will see it in [place]. Patient agreed.

## 2021-05-13 NOTE — Telephone Encounter (Signed)
CALLED PATIENT NO ANSWER LEFT VOICEMAIL FOR A CALL BACK ? ?

## 2021-05-16 ENCOUNTER — Other Ambulatory Visit: Payer: Self-pay

## 2021-05-16 DIAGNOSIS — K7460 Unspecified cirrhosis of liver: Secondary | ICD-10-CM

## 2021-05-21 ENCOUNTER — Ambulatory Visit
Admission: RE | Admit: 2021-05-21 | Discharge: 2021-05-21 | Disposition: A | Discharge status: Home / Self Care | Payer: 59 | Source: Ambulatory Visit | Attending: Gastroenterology | Admitting: Gastroenterology

## 2021-05-21 ENCOUNTER — Other Ambulatory Visit: Payer: Self-pay

## 2021-05-21 DIAGNOSIS — K7460 Unspecified cirrhosis of liver: Secondary | ICD-10-CM | POA: Diagnosis not present

## 2021-05-21 DIAGNOSIS — D7389 Other diseases of spleen: Secondary | ICD-10-CM | POA: Diagnosis not present

## 2021-05-21 DIAGNOSIS — K766 Portal hypertension: Secondary | ICD-10-CM | POA: Diagnosis not present

## 2021-05-21 DIAGNOSIS — Z8505 Personal history of malignant neoplasm of liver: Secondary | ICD-10-CM | POA: Diagnosis not present

## 2021-05-21 DIAGNOSIS — I8500 Esophageal varices without bleeding: Secondary | ICD-10-CM | POA: Diagnosis not present

## 2021-05-21 MED ORDER — GADOBUTROL 1 MMOL/ML IV SOLN
9.0000 mL | Freq: Once | INTRAVENOUS | Status: AC | PRN
  Administered 2021-05-21: 19:00:00 9 mL via INTRAVENOUS

## 2021-05-23 ENCOUNTER — Other Ambulatory Visit: Payer: Self-pay | Admitting: Gastroenterology

## 2021-05-29 ENCOUNTER — Telehealth: Payer: Self-pay

## 2021-05-29 NOTE — Telephone Encounter (Signed)
-----   Message from [name], MD sent at 05/29/2021  1:41 PM EST ----- Appears stable @ liver on MRI

## 2021-05-29 NOTE — Progress Notes (Signed)
Appears stable @ liver on MRI

## 2021-05-29 NOTE — Telephone Encounter (Signed)
Called patient but left a detailed message letting him know the below information and to call us back if he had further questions.

## 2021-06-18 ENCOUNTER — Encounter: Payer: Self-pay | Admitting: Gastroenterology

## 2021-06-18 ENCOUNTER — Other Ambulatory Visit: Payer: Self-pay

## 2021-06-18 ENCOUNTER — Ambulatory Visit (INDEPENDENT_AMBULATORY_CARE_PROVIDER_SITE_OTHER): Payer: 59 | Admitting: Gastroenterology

## 2021-06-18 VITALS — BP 132/87 | HR 58 | Temp 98.2°F | Ht 66.0 in | Wt 188.8 lb

## 2021-06-18 DIAGNOSIS — K7460 Unspecified cirrhosis of liver: Secondary | ICD-10-CM | POA: Diagnosis not present

## 2021-06-18 DIAGNOSIS — I8500 Esophageal varices without bleeding: Secondary | ICD-10-CM

## 2021-06-18 DIAGNOSIS — Z23 Encounter for immunization: Secondary | ICD-10-CM | POA: Diagnosis not present

## 2021-06-18 NOTE — Progress Notes (Addendum)
Ruq ultrasound appointment is at [facility] at [place] after midnight  on September 26 2021 called patient to let him know it was scheduled

## 2021-06-18 NOTE — Addendum Note (Signed)
Addended by: Eliseo Squires on: 06/18/2021 03:51 PM   Modules accepted: Orders

## 2021-06-18 NOTE — Progress Notes (Signed)
Ryan Bellows MD, MRCP(U.K) [address]  [place]  [place], [place] [address]  Main: [phone]  Fax: [phone]   Primary Care Physician: Patient, No Pcp Per (Inactive)  Primary Gastroenterologist:  Dr. Jonathon Willis   Chief Complaint  Patient presents with   Follow-up    HPI: Ryan Willis. is a 61 y.o. male  Summary of history :   H/o  liver cirrhosis secondary to NAFLD with non bleeding esophageal varices grade 1     CT scan on 07/15/17 which showed features of cirrhosis and portal hypertension with with splenomegaly, a recanalized left paraumbilical vein and small distal esophageal varices. His dad had colon cancer..    01/01/18 : colonoscopy- 3 mm tubular adenoma resected  03/2020 : Underwent cholecystectomy  03/19/2020 seen Dr. Rogue Bussing for thrombocytopenia.   04/23/2020: CT abdomen and pelvis: stable features of cirrhosis , moderate stool burden .  05/31/2020: EGD: Grade 1 esophageal varices. Commenced on 20 mg Nadolol.  10/05/2020: RUQ USG: No liver mass      Interval history 12/26/2020-06/18/2021  05/22/2021: MRI liver: no liver mass MELD score 10 in 05/2021  NSAID's: no  Low Salt Diet :  Yes Sleep pattern/confusion/memory issues: Minimal.  We discussed about commencing on Xifaxan but he was not keen on at this point of time Bowel  movements : on lactulose PRN- atleast 1 BM  per day not used much lactulose recently but having good soft bowel movements Heart rate around 58 bpm No other complaints     Current Outpatient Medications  Medication Sig Dispense Refill   lactulose (CHRONULAC) 10 GM/15ML solution Take by mouth 3 (three) times daily as needed for mild constipation.     Multiple Vitamin (MULTIVITAMIN WITH MINERALS) TABS tablet Take 1 tablet by mouth daily.     nadolol (CORGARD) 20 MG tablet TAKE 1 TABLET(20 MG) BY MOUTH DAILY 90 tablet 3   No current facility-administered medications for this visit.    Allergies as of 06/18/2021   (No Known  Allergies)    ROS:  General: Negative for anorexia, weight loss, fever, chills, fatigue, weakness. ENT: Negative for hoarseness, difficulty swallowing , nasal congestion. CV: Negative for chest pain, angina, palpitations, dyspnea on exertion, peripheral edema.  Respiratory: Negative for dyspnea at rest, dyspnea on exertion, cough, sputum, wheezing.  GI: See history of present illness. GU:  Negative for dysuria, hematuria, urinary incontinence, urinary frequency, nocturnal urination.  Endo: Negative for unusual weight change.    Physical Examination:   BP 132/87 (BP Location: Right Arm, Patient Position: Sitting, Cuff Size: Normal)    Pulse (!) 58    Temp 98.2 F (36.8 C) (Oral)    Ht 5\' 6"  (1.676 m)    Wt 188 lb 12.8 oz (85.6 kg)    BMI 30.47 kg/m   General: Well-nourished, well-developed in no acute distress.  Eyes: No icterus. Conjunctivae pink. Mouth: Oropharyngeal mucosa moist and pink , no lesions erythema or exudate. Lungs: Clear to auscultation bilaterally. Non-labored. Heart: Regular rate and rhythm, no murmurs rubs or gallops.  Abdomen: Bowel sounds are normal, nontender, nondistended, no hepatosplenomegaly or masses, no abdominal bruits or hernia , no rebound or guarding.   Extremities: No lower extremity edema. No clubbing or deformities. Neuro: Alert and oriented x 3.  Grossly intact. Psych: Alert and cooperative, normal mood and affect.   Imaging Studies: MR LIVER W WO CONTRAST  Result Date: 05/22/2021 CLINICAL DATA:  Screen for hepatocellular carcinoma. Chronic liver disease. Cirrhosis EXAM:  MRI ABDOMEN WITHOUT AND WITH CONTRAST TECHNIQUE: Multiplanar multisequence MR imaging of the abdomen was performed both before and after the administration of intravenous contrast. CONTRAST:  49mL GADAVIST GADOBUTROL 1 MMOL/ML IV SOLN COMPARISON:  MRI 11/10/2019 CT 09/13/2020 FINDINGS: Lower chest:  Lung bases are clear. Hepatobiliary: No early enhancing lesion within the liver  parenchyma on postcontrast T1 weighted imaging. No delayed enhancing lesions. Benign nonenhancing cyst in the RIGHT hepatic lobe measures 9 mm image 12/8. Portal veins are patent.  Splenic vein is patent. Soft G ule venous collaterals are noted (image 24/series 19). These extend from enlarged carpal vein. No ascites Pancreas: Normal pancreatic parenchymal intensity. No ductal dilatation or inflammation. Spleen: Multiple small hyperintense lesions are present throughout the spleen on T2 weighted imaging. Spleen remains enlarged measuring 14 cm 7.9 cm 18.2 cm. The small splenic lesions and spleen size are not changed from comparison exam. Adrenals/urinary tract: Adrenal glands normal. Nonenhancing cyst of the LEFT kidney Stomach/Bowel: Stomach and limited of the small bowel is unremarkable Vascular/Lymphatic: Abdominal aortic normal caliber. No retroperitoneal periportal lymphadenopathy. Musculoskeletal: No aggressive osseous lesion. T2 hyperintense lesion in the lower thoracic spine is favored a hemangioma. No change from prior IMPRESSION: 1. No enhancing lesion in the liver. No evidence of hepatocellular carcinoma. 2. Portal hypertension with paraesophageal varices and splenomegaly. No ascites. No interval change. 3. Multiple small lesions throughout the spleen are not changed from comparison exam and favored small hemangiomata. Electronically Signed   By: Suzy Bouchard M.D.   On: 05/22/2021 16:55    Assessment and Plan:   Zalen Ryan Willis. is a 61 y.o. y/o malehere to follow-up for liver cirrhosis, non bleeding esophageal varices secondary to NAFLD.  He is compensated and immune to hepatitis a and B.  No recent history of hepatic encephalopathy.  Meld score is 10 in 05/2021   Plan 1.  Recommend COVID-19 immunization/booster dose which he is due and has not taken yet 2.  Recommend pneumococcal vaccine 3.  Low-salt diet, avoid NSAID's 4.  RUQ [place] to screen for [facility] in 10/2021 5.  EGD to screen for esophageal  varices in  06/2021 that has been scheduled  6.  Lactulose : continue to take at present dose which is working well  7.  Nadolol 20 mg a day continue as his heart rate is 58  bpm-continue at present dose   Dr Ryan Bellows  MD,MRCP [facility]) Follow up in 6 months

## 2021-06-18 NOTE — Addendum Note (Signed)
Addended by: Eliseo Squires on: 06/18/2021 03:38 PM   Modules accepted: Orders

## 2021-07-04 ENCOUNTER — Encounter: Payer: Self-pay | Admitting: Gastroenterology

## 2021-07-05 ENCOUNTER — Ambulatory Visit
Admission: RE | Admit: 2021-07-05 | Discharge: 2021-07-05 | Disposition: A | Discharge status: Home / Self Care | Payer: 59 | Source: Ambulatory Visit | Attending: Gastroenterology | Admitting: Gastroenterology

## 2021-07-05 ENCOUNTER — Ambulatory Visit: Payer: 59 | Admitting: Anesthesiology

## 2021-07-05 ENCOUNTER — Encounter
Admission: RE | Disposition: A | Discharge status: Home / Self Care | Payer: Self-pay | Source: Ambulatory Visit | Attending: Gastroenterology

## 2021-07-05 DIAGNOSIS — K7460 Unspecified cirrhosis of liver: Secondary | ICD-10-CM | POA: Insufficient documentation

## 2021-07-05 DIAGNOSIS — I8510 Secondary esophageal varices without bleeding: Secondary | ICD-10-CM | POA: Diagnosis not present

## 2021-07-05 DIAGNOSIS — E039 Hypothyroidism, unspecified: Secondary | ICD-10-CM | POA: Diagnosis not present

## 2021-07-05 DIAGNOSIS — I8500 Esophageal varices without bleeding: Secondary | ICD-10-CM | POA: Diagnosis not present

## 2021-07-05 DIAGNOSIS — I10 Essential (primary) hypertension: Secondary | ICD-10-CM | POA: Diagnosis not present

## 2021-07-05 HISTORY — PX: ESOPHAGOGASTRODUODENOSCOPY (EGD) WITH PROPOFOL: SHX5813

## 2021-07-05 SURGERY — ESOPHAGOGASTRODUODENOSCOPY (EGD) WITH PROPOFOL
Anesthesia: General

## 2021-07-05 MED ORDER — SODIUM CHLORIDE 0.9 % IV SOLN: INTRAVENOUS | Status: DC

## 2021-07-05 MED ORDER — PROPOFOL 500 MG/50ML IV EMUL
INTRAVENOUS | Status: DC | PRN
  Administered 2021-07-05: 125 ug/kg/min via INTRAVENOUS

## 2021-07-05 MED ORDER — LIDOCAINE HCL (CARDIAC) PF 100 MG/5ML IV SOSY
PREFILLED_SYRINGE | INTRAVENOUS | Status: DC | PRN
  Administered 2021-07-05: 50 mg via INTRAVENOUS

## 2021-07-05 MED ORDER — PROPOFOL 10 MG/ML IV BOLUS
INTRAVENOUS | Status: DC | PRN
  Administered 2021-07-05: 70 mg via INTRAVENOUS
  Administered 2021-07-05: 30 mg via INTRAVENOUS

## 2021-07-05 MED ORDER — PROPOFOL 500 MG/50ML IV EMUL
INTRAVENOUS | Status: AC
  Filled 2021-07-05: qty 50

## 2021-07-05 MED ORDER — LIDOCAINE HCL (PF) 2 % IJ SOLN
INTRAMUSCULAR | Status: AC
  Filled 2021-07-05: qty 5

## 2021-07-05 NOTE — Op Note (Signed)
[facility] ?Gastroenterology ?Patient Name: Ryan Willis ?Procedure Date: 07/05/2021 7:11 AM ?MRN: [id number] ?Account #: [id number] ?Date of Birth: 1960/10/24 ?Admit Type: Outpatient ?Age: 61 ?Room: [facility] ENDO ROOM 4 ?Gender: Male ?Note Status: Finalized ?Instrument Name: Upper Endoscope [id number] ?Procedure:             Upper GI endoscopy ?Indications:           Cirrhosis rule out esophageal varices ?Providers:             [name] MD, MD ?Referring MD:          Forest Gleason Md, MD (Referring MD) ?Medicines:             Monitored Anesthesia Care ?Complications:         No immediate complications. ?Procedure:             Pre-Anesthesia Assessment: ?                       - Prior to the procedure, a History and Physical was  ?                       performed, and patient medications, allergies and  ?                       sensitivities were reviewed. The patient's tolerance  ?                       of previous anesthesia was reviewed. ?                       - The risks and benefits of the procedure and the  ?                       sedation options and risks were discussed with the  ?                       patient. All questions were answered and informed  ?                       consent was obtained. ?                       - ASA Grade Assessment: III - A patient with severe  ?                       systemic disease. ?                       After obtaining informed consent, the endoscope was  ?                       passed under direct vision. Throughout the procedure,  ?                       the patient's blood pressure, pulse, and oxygen  ?                       saturations were monitored continuously. The Endoscope  ?  was introduced through the mouth, and advanced to the  ?                       third part of duodenum. The upper GI endoscopy was  ?                       accomplished with ease. The patient tolerated the  ?                       procedure well. ?Findings: ?      The examined duodenum was normal. ?     The stomach was normal. ?     The cardia and gastric fundus were normal on retroflexion. ?     Grade III varices were found in the lower third of the esophagus. They  ?     were large in size. Four bands were successfully placed with incomplete  ?     eradication of varices. There was no bleeding during and at the end of  ?     the procedure. ?Impression:            - Normal examined duodenum. ?                       - Normal stomach. ?                       - Grade III esophageal varices. Incompletely  ?                       eradicated. Banded. ?                       - No specimens collected. ?Recommendation:        - Discharge patient to home (with escort). ?                       - Resume previous diet. ?                       - Continue present medications. ?                       - Repeat upper endoscopy in 4 weeks for retreatment. ?Procedure Code(s):     --- Professional --- ?                       [phone], Esophagogastroduodenoscopy, flexible,  ?                       transoral; with band ligation of esophageal/gastric  ?                       varices ?Diagnosis Code(s):     --- Professional --- ?                       K74.60, Unspecified cirrhosis of liver ?                       I85.10, Secondary esophageal varices without bleeding ?CPT copyright 2019 American Medical Association. All rights reserved. ?The codes documented in this report are preliminary and upon coder review may  ?  be revised to meet current compliance requirements. ?[name], MD ?[name] MD, MD ?07/05/2021 8:04:18 AM ?This report has been signed electronically. ?Number of Addenda: 0 ?Note Initiated On: 07/05/2021 7:11 AM ?Estimated Blood Loss:  Estimated blood loss: none. ?     [facility] ?

## 2021-07-05 NOTE — Anesthesia Preprocedure Evaluation (Signed)
Anesthesia Evaluation  ?Patient identified by MRN, date of birth, ID band ?Patient awake ? ? ? ?Reviewed: ?Allergy & Precautions, NPO status , Patient's Chart, lab work & pertinent test results ? ?Airway ?Mallampati: II ? ?TM Distance: >3 FB ?Neck ROM: Full ? ? ? Dental ? ?(+) Teeth Intact ?  ?Pulmonary ?neg pulmonary ROS,  ?  ?Pulmonary exam normal ?breath sounds clear to auscultation ? ? ? ? ? ? Cardiovascular ?Exercise Tolerance: Good ?hypertension, Pt. on medications ?negative cardio ROS ?Normal cardiovascular exam ?Rhythm:Regular  ? ?  ?Neuro/Psych ?negative neurological ROS ? negative psych ROS  ? GI/Hepatic ?negative GI ROS, Neg liver ROS,   ?Endo/Other  ?negative endocrine ROSHypothyroidism  ? Renal/GU ?negative Renal ROS  ?negative genitourinary ?  ?Musculoskeletal ?negative musculoskeletal ROS ?(+)  ? Abdominal ?Normal abdominal exam  (+)   ?Peds ?negative pediatric ROS ?(+)  Hematology ?negative hematology ROS ?(+)   ?Anesthesia Other Findings ?Past Medical History: ?No date: Cirrhosis of liver ([place]) ?No date: Diverticulitis ?No date: Family history of adverse reaction to anesthesia ?    Comment:  MOTHER HAD N/V ?No date: History of kidney stones ?No date: Hypercholesteremia ?No date: Hypothyroidism ?No date: Peyronie's disease ?No date: Renal cyst, left ?No date: Seasonal allergies ?No date: Thrombocytopenia ([place]) ? ?Past Surgical History: ?No date: CHOLECYSTECTOMY ?2006: COLONOSCOPY ?09/08/2017: COLONOSCOPY WITH PROPOFOL; N/A ?    Comment:  Procedure: COLONOSCOPY WITH PROPOFOL;  Surgeon: Vicente Males,  ?             Bailey Mech, MD;  Location: [place];  Service:  ?             Gastroenterology;  Laterality: N/A; ?01/01/2018: COLONOSCOPY WITH PROPOFOL; N/A ?    Comment:  Procedure: COLONOSCOPY WITH PROPOFOL;  Surgeon: Vicente Males,  ?             Bailey Mech, MD;  Location: [place];  Service:  ?             Gastroenterology;  Laterality: N/A; ?01/17/2019: CYSTOSCOPY/URETEROSCOPY/HOLMIUM  LASER/STENT PLACEMENT;  ?Right ?    Comment:  Procedure: CYSTOSCOPY/URETEROSCOPY/STENT PLACEMENT;   ?             Surgeon: Hollice Espy, MD;  Location: [facility] ORS;   ?             Service: Urology;  Laterality: Right; ?01/31/2019: CYSTOSCOPY/URETEROSCOPY/HOLMIUM LASER/STENT PLACEMENT;  ?Right ?    Comment:  Procedure: CYSTOSCOPY/URETEROSCOPY/HOLMIUM LASER/STENT  ?             EXCHANGE;  Surgeon: Hollice Espy, MD;  Location: [facility]  ?             ORS;  Service: Urology;  Laterality: Right; ?08/13/2020: CYSTOSCOPY/URETEROSCOPY/HOLMIUM LASER/STENT PLACEMENT; Left ?    Comment:  Procedure: CYSTOSCOPY/URETEROSCOPY/HOLMIUM LASER/STENT  ?             PLACEMENT;  Surgeon: Hollice Espy, MD;  Location: [facility] ?             ORS;  Service: Urology;  Laterality: Left; ?09/08/2017: ESOPHAGOGASTRODUODENOSCOPY (EGD) WITH PROPOFOL; N/A ?    Comment:  Procedure: ESOPHAGOGASTRODUODENOSCOPY (EGD) WITH  ?             PROPOFOL;  Surgeon: [name], MD;  Location: [facility]  ?             ENDOSCOPY;  Service: Gastroenterology;  Laterality: N/A; ?05/31/2020: ESOPHAGOGASTRODUODENOSCOPY (EGD) WITH PROPOFOL; N/A ?    Comment:  Procedure: ESOPHAGOGASTRODUODENOSCOPY (EGD) WITH  ?  PROPOFOL;  Surgeon: [name], MD;  Location: [facility]  ?             ENDOSCOPY;  Service: Gastroenterology;  Laterality: N/A; ?1981: HERNIA REPAIR; Right ?    Comment:  inguinal ?04/19/2019: INTRAOPERATIVE CHOLANGIOGRAM; N/A ?    Comment:  Procedure: INTRAOPERATIVE CHOLANGIOGRAM;  Surgeon:  ?             Jules Husbands, MD;  Location: [facility] ORS;  Service:  ?             General;  Laterality: N/A; ?2001: SHOULDER SURGERY; Right ?    Comment:  ROTATOR CUFF REPAIR ?10/27/2019: VENTRAL HERNIA REPAIR; N/A ?    Comment:  Procedure: HERNIA REPAIR VENTRAL ADULT, Open;  Surgeon:  ?             Jules Husbands, MD;  Location: [facility] ORS;  Service:  ?             General;  Laterality: N/A; ? ?BMI   ? Body Mass Index: 29.05 kg/m?  ?  ? ? Reproductive/Obstetrics ?negative OB  ROS ? ?  ? ? ? ? ? ? ? ? ? ? ? ? ? ?  ?  ? ? ? ? ? ? ? ? ?Anesthesia Physical ?Anesthesia Plan ? ?ASA: 2 ? ?Anesthesia Plan: General  ? ?Post-op Pain Management:   ? ?Induction: Intravenous ? ?PONV Risk Score and Plan: Propofol infusion and TIVA ? ?Airway Management Planned: Natural Airway and Nasal Cannula ? ?Additional Equipment:  ? ?Intra-op Plan:  ? ?Post-operative Plan:  ? ?Informed Consent: I have reviewed the patients History and Physical, chart, labs and discussed the procedure including the risks, benefits and alternatives for the proposed anesthesia with the patient or authorized representative who has indicated his/her understanding and acceptance.  ? ? ? ?Dental Advisory Given ? ?Plan Discussed with: CRNA and Surgeon ? ?Anesthesia Plan Comments:   ? ? ? ? ? ? ?Anesthesia Quick Evaluation ? ?

## 2021-07-05 NOTE — H&P (Signed)
? ? ? ?[name], MD ?[address] [place], [place], [place], [address] ?[address] [place], [place], [place], [address] ?Phone: [phone]  ?Fax: [phone] ? ?Primary Care Physician:  Patient, No Pcp Per (Inactive) ? ? ?Pre-Procedure History & Physical: ?HPI:  Ryan Willis. is a 61 y.o. male is here for an endoscopy  ?  ?Past Medical History:  ?Diagnosis Date  ? Cirrhosis of liver ([place])   ? Diverticulitis   ? Family history of adverse reaction to anesthesia   ? MOTHER HAD N/V  ? History of kidney stones   ? Hypercholesteremia   ? Hypothyroidism   ? Peyronie's disease   ? Renal cyst, left   ? Seasonal allergies   ? Thrombocytopenia ([place])   ? ? ?Past Surgical History:  ?Procedure Laterality Date  ? CHOLECYSTECTOMY    ? COLONOSCOPY  2006  ? COLONOSCOPY WITH PROPOFOL N/A 09/08/2017  ? Procedure: COLONOSCOPY WITH PROPOFOL;  Surgeon: [name], MD;  Location[facility] ENDOSCOPY;  Service: Gastroenterology;  Laterality: N/A;  ? COLONOSCOPY WITH PROPOFOL N/A 01/01/2018  ? Procedure: COLONOSCOPY WITH PROPOFOL;  Surgeon: [name], MD;  Location[facility] ENDOSCOPY;  Service: Gastroenterology;  Laterality: N/A;  ? CYSTOSCOPY/URETEROSCOPY/HOLMIUM LASER/STENT PLACEMENT Right 01/17/2019  ? Procedure: CYSTOSCOPY/URETEROSCOPY/STENT PLACEMENT;  Surgeon: Hollice Espy, MD;  Location: [facility];  Service: Urology;  Laterality: Right;  ? CYSTOSCOPY/URETEROSCOPY/HOLMIUM LASER/STENT PLACEMENT Right 01/31/2019  ? Procedure: CYSTOSCOPY/URETEROSCOPY/HOLMIUM LASER/STENT EXCHANGE;  Surgeon: Hollice Espy, MD;  Location: [facility];  Service: Urology;  Laterality: Right;  ? CYSTOSCOPY/URETEROSCOPY/HOLMIUM LASER/STENT PLACEMENT Left 08/13/2020  ? Procedure: CYSTOSCOPY/URETEROSCOPY/HOLMIUM LASER/STENT PLACEMENT;  Surgeon: Hollice Espy, MD;  Location: [facility];  Service: Urology;  Laterality: Left;  ? ESOPHAGOGASTRODUODENOSCOPY (EGD) WITH PROPOFOL N/A 09/08/2017  ? Procedure: ESOPHAGOGASTRODUODENOSCOPY (EGD) WITH PROPOFOL;  Surgeon:  [name], MD;  Location: [facility] ENDOSCOPY;  Service: Gastroenterology;  Laterality: N/A;  ? ESOPHAGOGASTRODUODENOSCOPY (EGD) WITH PROPOFOL N/A 05/31/2020  ? Procedure: ESOPHAGOGASTRODUODENOSCOPY (EGD) WITH PROPOFOL;  Surgeon: [name], MD;  Location: [facility] ENDOSCOPY;  Service: Gastroenterology;  Laterality: N/A;  ? HERNIA REPAIR Right 1981  ? inguinal  ? INTRAOPERATIVE CHOLANGIOGRAM N/A 04/19/2019  ? Procedure: INTRAOPERATIVE CHOLANGIOGRAM;  Surgeon: Jules Husbands, MD;  Location: [facility] ORS;  Service: General;  Laterality: N/A;  ? SHOULDER SURGERY Right 2001  ? ROTATOR CUFF REPAIR  ? VENTRAL HERNIA REPAIR N/A 10/27/2019  ? Procedure: HERNIA REPAIR VENTRAL ADULT, Open;  Surgeon: Jules Husbands, MD;  Location: [facility];  Service: General;  Laterality: N/A;  ? ? ?Prior to Admission medications   ?Medication Sig Start Date End Date Taking? Authorizing Provider  ?lactulose (CHRONULAC) 10 GM/15ML solution Take by mouth 3 (three) times daily as needed for mild constipation.   Yes [provider]  ?Multiple Vitamin (MULTIVITAMIN WITH MINERALS) TABS tablet Take 1 tablet by mouth daily.   Yes [provider]  ?nadolol (CORGARD) 20 MG tablet TAKE 1 TABLET(20 MG) BY MOUTH DAILY 05/23/21  Yes [name], MD  ? ? ?Allergies as of 05/13/2021  ? (No Known Allergies)  ? ? ?Family History  ?Problem Relation Age of Onset  ? Kidney cancer Mother   ? Lung cancer Mother   ? Colon cancer Father   ? ? ?Social History  ? ?Socioeconomic History  ? Marital status: Married  ?  Spouse name: Not on file  ? Number of children: Not on file  ? Years of education: Not on file  ? Highest education level: Not on file  ?Occupational History  ?  Not on file  ?Tobacco Use  ? Smoking status: Never  ? Smokeless tobacco: Never  ?Vaping Use  ? Vaping Use: Never used  ?Substance and Sexual Activity  ? Alcohol use: Not Currently  ?  Comment: "1 or 2 year drinks a year."  ? Drug use: No  ? Sexual activity: Not on file  ?Other Topics Concern  ?  Not on file  ?Social History Narrative  ? retd. Engineer, structural; part time information specialist; never smoked; no alcohol; live in [place]. Lives with wife.   ? ?Social Determinants of Health  ? ?Financial Resource Strain: Not on file  ?Food Insecurity: Not on file  ?Transportation Needs: Not on file  ?Physical Activity: Not on file  ?Stress: Not on file  ?Social Connections: Not on file  ?Intimate Partner Violence: Not on file  ? ? ?Review of Systems: ?See HPI, otherwise negative ROS ? ?Physical Exam: ?BP 116/67   Pulse (!) 52   Temp (!) 96.6 ?F (35.9 ?C) (Temporal)   Resp 20   Ht 5' 6"  (1.676 m)   Wt 81.6 kg   SpO2 99%   BMI 29.05 kg/m?  ?General:   Alert,  pleasant and cooperative in NAD ?Head:  Normocephalic and atraumatic. ?Neck:  Supple; no masses or thyromegaly. ?Lungs:  Clear throughout to auscultation, normal respiratory effort.    ?Heart:  +S1, +S2, Regular rate and rhythm, No edema. ?Abdomen:  Soft, nontender and nondistended. Normal bowel sounds, without guarding, and without rebound.   ?Neurologic:  Alert and  oriented x4;  grossly normal neurologically. ? ?Impression/Plan: ?Ryan Willis. is here for an endoscopy  to be performed for  evaluation of esophageal varices ?   ?Risks, benefits, limitations, and alternatives regarding endoscopy have been reviewed with the patient.  Questions have been answered.  All parties agreeable. ? ? ?[name], MD  07/05/2021, 7:49 AM ?Esophageal va ?

## 2021-07-05 NOTE — Transfer of Care (Signed)
Immediate Anesthesia Transfer of Care Note ? ?Patient: Ryan Willis. ? ?Procedure(s) Performed: ESOPHAGOGASTRODUODENOSCOPY (EGD) WITH PROPOFOL ? ?Patient Location: PACU ? ?Anesthesia Type:General ? ?Level of Consciousness: awake and drowsy ? ?Airway & Oxygen Therapy: Patient Spontanous Breathing ? ?Post-op Assessment: Report given to RN and Post -op Vital signs reviewed and stable ? ?Post vital signs: Reviewed and stable ? ?Last Vitals:  ?Vitals Value Taken Time  ?BP 124/85 07/05/21 0807  ?Temp    ?Pulse 57 07/05/21 0807  ?Resp 17 07/05/21 0807  ?SpO2 95 % 07/05/21 0807  ? ? ?Last Pain:  ?Vitals:  ? 07/05/21 0649  ?TempSrc: Temporal  ?PainSc: 0-No pain  ?   ? ?  ? ?Complications: No notable events documented. ?

## 2021-07-05 NOTE — Anesthesia Postprocedure Evaluation (Signed)
Anesthesia Post Note ? ?Patient: Ryan Willis. ? ?Procedure(s) Performed: ESOPHAGOGASTRODUODENOSCOPY (EGD) WITH PROPOFOL ? ?Patient location during evaluation: PACU ?Anesthesia Type: General ?Level of consciousness: awake and oriented ?Pain management: pain level controlled ?Vital Signs Assessment: post-procedure vital signs reviewed and stable ?Respiratory status: spontaneous breathing ?Cardiovascular status: stable ?Anesthetic complications: no ? ? ?No notable events documented. ? ? ?Last Vitals:  ?Vitals:  ? 07/05/21 0830 07/05/21 0833  ?BP:    ?Pulse: (!) 51 (!) 56  ?Resp: 12 13  ?Temp:    ?SpO2: 98% 99%  ?  ?Last Pain:  ?Vitals:  ? 07/05/21 0800  ?TempSrc: Temporal  ?PainSc:   ? ? ?  ?  ?  ?  ?  ?  ? ?VAN STAVEREN,Bruno Leach ? ? ? ? ?

## 2021-07-08 ENCOUNTER — Encounter: Payer: Self-pay | Admitting: Gastroenterology

## 2021-07-09 ENCOUNTER — Other Ambulatory Visit: Payer: Self-pay

## 2021-07-09 ENCOUNTER — Telehealth: Payer: Self-pay | Admitting: Gastroenterology

## 2021-07-09 DIAGNOSIS — R5082 Postprocedural fever: Secondary | ICD-10-CM

## 2021-07-09 LAB — POCT INFLUENZA A/B
Influenza A, POC: NEGATIVE
Influenza B, POC: NEGATIVE

## 2021-07-09 LAB — POC COVID19 BINAXNOW: SARS Coronavirus 2 Ag: NEGATIVE

## 2021-07-09 NOTE — Telephone Encounter (Signed)
Called patient back and he stated that he had a fever (101 F) and body chills since last night at 6 PM. Patient also stated that he has sinus drainage. He denies having sore throat, nausea, vomiting, diarrhea and constipation. I told him that it sound like he has a viral infection and to please call his primary care doctor. He stated that he did not have one. Therefore, I recommended for him to go to an urgent care so they could test him for COVID and flu. The patient agreed and stated that he would tell his wife to take him.  ?

## 2021-07-09 NOTE — Telephone Encounter (Signed)
Patient states that he has a fever after his procedure. Current temp is 101, feels achy all over, and does have an appetite. Patient states he wanted to touch base with the doctor.  ?

## 2021-07-09 NOTE — Progress Notes (Signed)
Rapid Influenza A & B = Negative ?Rapid Covid = Negative ? ?Informed Ranny of negative test results.  Also, informed him that Randel Pigg, PA-C said if both tests are negative, Jakori needs to contact the physician who performed a recent abdominal procedure.  Verbalized understanding. ? ?[facility] ?

## 2021-07-29 ENCOUNTER — Other Ambulatory Visit: Payer: Self-pay

## 2021-07-29 ENCOUNTER — Telehealth: Payer: Self-pay | Admitting: Gastroenterology

## 2021-07-29 DIAGNOSIS — I8500 Esophageal varices without bleeding: Secondary | ICD-10-CM

## 2021-07-29 NOTE — Telephone Encounter (Signed)
Patient was contacted and his EGD was scheduled to be done on 08/08/2021. ?

## 2021-07-29 NOTE — Telephone Encounter (Signed)
Patient needs repeat EGD scheduled. ?

## 2021-08-08 ENCOUNTER — Ambulatory Visit: Payer: 59 | Admitting: Anesthesiology

## 2021-08-08 ENCOUNTER — Encounter
Admission: RE | Disposition: A | Discharge status: Home / Self Care | Payer: Self-pay | Source: Ambulatory Visit | Attending: Gastroenterology

## 2021-08-08 ENCOUNTER — Ambulatory Visit
Admission: RE | Admit: 2021-08-08 | Discharge: 2021-08-08 | Disposition: A | Discharge status: Home / Self Care | Payer: 59 | Source: Ambulatory Visit | Attending: Gastroenterology | Admitting: Gastroenterology

## 2021-08-08 DIAGNOSIS — K766 Portal hypertension: Secondary | ICD-10-CM | POA: Diagnosis not present

## 2021-08-08 DIAGNOSIS — I8500 Esophageal varices without bleeding: Secondary | ICD-10-CM | POA: Diagnosis not present

## 2021-08-08 DIAGNOSIS — I8510 Secondary esophageal varices without bleeding: Secondary | ICD-10-CM | POA: Insufficient documentation

## 2021-08-08 DIAGNOSIS — K7460 Unspecified cirrhosis of liver: Secondary | ICD-10-CM | POA: Insufficient documentation

## 2021-08-08 DIAGNOSIS — E7800 Pure hypercholesterolemia, unspecified: Secondary | ICD-10-CM | POA: Diagnosis not present

## 2021-08-08 DIAGNOSIS — K3189 Other diseases of stomach and duodenum: Secondary | ICD-10-CM | POA: Diagnosis not present

## 2021-08-08 HISTORY — PX: ESOPHAGOGASTRODUODENOSCOPY (EGD) WITH PROPOFOL: SHX5813

## 2021-08-08 SURGERY — ESOPHAGOGASTRODUODENOSCOPY (EGD) WITH PROPOFOL
Anesthesia: General

## 2021-08-08 MED ORDER — DEXMEDETOMIDINE HCL IN NACL 200 MCG/50ML IV SOLN
INTRAVENOUS | Status: DC | PRN
  Administered 2021-08-08: 12 ug via INTRAVENOUS

## 2021-08-08 MED ORDER — PROPOFOL 10 MG/ML IV BOLUS
INTRAVENOUS | Status: DC | PRN
  Administered 2021-08-08: 30 mg via INTRAVENOUS
  Administered 2021-08-08: 80 mg via INTRAVENOUS
  Administered 2021-08-08 (×2): 20 mg via INTRAVENOUS

## 2021-08-08 MED ORDER — PROPOFOL 500 MG/50ML IV EMUL
INTRAVENOUS | Status: AC
  Filled 2021-08-08: qty 50

## 2021-08-08 MED ORDER — LIDOCAINE HCL (PF) 2 % IJ SOLN
INTRAMUSCULAR | Status: AC
  Filled 2021-08-08: qty 5

## 2021-08-08 MED ORDER — PROPOFOL 10 MG/ML IV BOLUS
INTRAVENOUS | Status: AC
Start: 2021-08-08 — End: ?
  Filled 2021-08-08: qty 20

## 2021-08-08 MED ORDER — SODIUM CHLORIDE 0.9 % IV SOLN: INTRAVENOUS | Status: DC

## 2021-08-08 NOTE — H&P (Signed)
? ? ? ?[name], MD ?[address] [place], [place], [place], [address] ?[address] [place], [place], [place], [address] ?Phone: [phone]  ?Fax: [phone] ? ?Primary Care Physician:  Patient, No Pcp Per (Inactive) ? ? ?Pre-Procedure History & Physical: ?HPI:  Ryan Willis. is a 61 y.o. male is here for an endoscopy  ?  ?Past Medical History:  ?Diagnosis Date  ? Cirrhosis of liver ([place])   ? Diverticulitis   ? Family history of adverse reaction to anesthesia   ? MOTHER HAD N/V  ? History of kidney stones   ? Hypercholesteremia   ? Hypothyroidism   ? Peyronie's disease   ? Renal cyst, left   ? Seasonal allergies   ? Thrombocytopenia ([place])   ? ? ?Past Surgical History:  ?Procedure Laterality Date  ? CHOLECYSTECTOMY    ? COLONOSCOPY  2006  ? COLONOSCOPY WITH PROPOFOL N/A 09/08/2017  ? Procedure: COLONOSCOPY WITH PROPOFOL;  Surgeon: [name], MD;  Location: [facility] ENDOSCOPY;  Service: Gastroenterology;  Laterality: N/A;  ? COLONOSCOPY WITH PROPOFOL N/A 01/01/2018  ? Procedure: COLONOSCOPY WITH PROPOFOL;  Surgeon: [name], MD;  Location: [facility] ENDOSCOPY;  Service: Gastroenterology;  Laterality: N/A;  ? CYSTOSCOPY/URETEROSCOPY/HOLMIUM LASER/STENT PLACEMENT Right 01/17/2019  ? Procedure: CYSTOSCOPY/URETEROSCOPY/STENT PLACEMENT;  Surgeon: Hollice Espy, MD;  Location: [facility] ORS;  Service: Urology;  Laterality: Right;  ? CYSTOSCOPY/URETEROSCOPY/HOLMIUM LASER/STENT PLACEMENT Right 01/31/2019  ? Procedure: CYSTOSCOPY/URETEROSCOPY/HOLMIUM LASER/STENT EXCHANGE;  Surgeon: Hollice Espy, MD;  Location: [facility] ORS;  Service: Urology;  Laterality: Right;  ? CYSTOSCOPY/URETEROSCOPY/HOLMIUM LASER/STENT PLACEMENT Left 08/13/2020  ? Procedure: CYSTOSCOPY/URETEROSCOPY/HOLMIUM LASER/STENT PLACEMENT;  Surgeon: Hollice Espy, MD;  Location: [facility] ORS;  Service: Urology;  Laterality: Left;  ? ESOPHAGOGASTRODUODENOSCOPY (EGD) WITH PROPOFOL N/A 09/08/2017  ? Procedure: ESOPHAGOGASTRODUODENOSCOPY (EGD) WITH PROPOFOL;  Surgeon:  [name], MD;  Location: [facility] ENDOSCOPY;  Service: Gastroenterology;  Laterality: N/A;  ? ESOPHAGOGASTRODUODENOSCOPY (EGD) WITH PROPOFOL N/A 05/31/2020  ? Procedure: ESOPHAGOGASTRODUODENOSCOPY (EGD) WITH PROPOFOL;  Surgeon: [name], MD;  Location: [facility] ENDOSCOPY;  Service: Gastroenterology;  Laterality: N/A;  ? ESOPHAGOGASTRODUODENOSCOPY (EGD) WITH PROPOFOL N/A 07/05/2021  ? Procedure: ESOPHAGOGASTRODUODENOSCOPY (EGD) WITH PROPOFOL;  Surgeon: [name], MD;  Location: [facility] ENDOSCOPY;  Service: Gastroenterology;  Laterality: N/A;  WIFE WILL BE DRIVER: WILL BE 20 - 30 MINUTES AWAY  ? HERNIA REPAIR Right 1981  ? inguinal  ? INTRAOPERATIVE CHOLANGIOGRAM N/A 04/19/2019  ? Procedure: INTRAOPERATIVE CHOLANGIOGRAM;  Surgeon: Jules Husbands, MD;  Location: [facility] ORS;  Service: General;  Laterality: N/A;  ? SHOULDER SURGERY Right 2001  ? ROTATOR CUFF REPAIR  ? VENTRAL HERNIA REPAIR N/A 10/27/2019  ? Procedure: HERNIA REPAIR VENTRAL ADULT, Open;  Surgeon: Jules Husbands, MD;  Location: [facility] ORS;  Service: General;  Laterality: N/A;  ? ? ?Prior to Admission medications   ?Medication Sig Start Date End Date Taking? Authorizing Provider  ?Multiple Vitamin (MULTIVITAMIN WITH MINERALS) TABS tablet Take 1 tablet by mouth daily.   Yes [provider]  ?nadolol (CORGARD) 20 MG tablet TAKE 1 TABLET(20 MG) BY MOUTH DAILY 05/23/21  Yes [name], MD  ?lactulose [facility]) 10 GM/15ML solution Take by mouth 3 (three) times daily as needed for mild constipation.    [provider]  ? ? ?Allergies as of 07/29/2021  ? (No Known Allergies)  ? ? ?Family History  ?Problem Relation Age of Onset  ? Kidney cancer Mother   ? Lung cancer Mother   ? Colon cancer Father   ? ? ?Social History  ? ?  Socioeconomic History  ? Marital status: Married  ?  Spouse name: Not on file  ? Number of children: Not on file  ? Years of education: Not on file  ? Highest education level: Not on file  ?Occupational History  ? Not on file  ?Tobacco  Use  ? Smoking status: Never  ? Smokeless tobacco: Never  ?Vaping Use  ? Vaping Use: Never used  ?Substance and Sexual Activity  ? Alcohol use: Not Currently  ?  Comment: "1 or 2 year drinks a year."  ? Drug use: No  ? Sexual activity: Not on file  ?Other Topics Concern  ? Not on file  ?Social History Narrative  ? retd. Engineer, structural; part time information specialist; never smoked; no alcohol; live in [place]. Lives with wife.   ? ?Social Determinants of Health  ? ?Financial Resource Strain: Not on file  ?Food Insecurity: Not on file  ?Transportation Needs: Not on file  ?Physical Activity: Not on file  ?Stress: Not on file  ?Social Connections: Not on file  ?Intimate Partner Violence: Not on file  ? ? ?Review of Systems: ?See HPI, otherwise negative ROS ? ?Physical Exam: ?BP 110/74   Pulse (!) 51   Temp (!) 97 ?F (36.1 ?C) (Temporal)   Resp 18   Ht 5' 6"  (1.676 m)   Wt 81.6 kg   SpO2 100%   BMI 29.05 kg/m?  ?General:   Alert,  pleasant and cooperative in NAD ?Head:  Normocephalic and atraumatic. ?Neck:  Supple; no masses or thyromegaly. ?Lungs:  Clear throughout to auscultation, normal respiratory effort.    ?Heart:  +S1, +S2, Regular rate and rhythm, No edema. ?Abdomen:  Soft, nontender and nondistended. Normal bowel sounds, without guarding, and without rebound.   ?Neurologic:  Alert and  oriented x4;  grossly normal neurologically. ? ?Impression/Plan: ?Ryan Willis. is here for an endoscopy  to be performed for  evaluation of esophageal varices ?   ?Risks, benefits, limitations, and alternatives regarding endoscopy have been reviewed with the patient.  Questions have been answered.  All parties agreeable. ? ? ?[name], MD  08/08/2021, 7:49 AM  ?

## 2021-08-08 NOTE — Anesthesia Postprocedure Evaluation (Signed)
Anesthesia Post Note ? ?Patient: Ryan Willis. ? ?Procedure(s) Performed: ESOPHAGOGASTRODUODENOSCOPY (EGD) WITH PROPOFOL ? ?Patient location during evaluation: PACU ?Anesthesia Type: General ?Level of consciousness: awake and alert ?Pain management: pain level controlled ?Vital Signs Assessment: post-procedure vital signs reviewed and stable ?Respiratory status: spontaneous breathing, nonlabored ventilation, respiratory function stable and patient connected to nasal cannula oxygen ?Cardiovascular status: blood pressure returned to baseline and stable ?Postop Assessment: no apparent nausea or vomiting ?Anesthetic complications: no ? ? ?No notable events documented. ? ? ?Last Vitals:  ?Vitals:  ? 08/08/21 0900 08/08/21 0910  ?BP: 106/71 117/69  ?Pulse:    ?Resp: 16 16  ?Temp:    ?SpO2: 96% 96%  ?  ?Last Pain:  ?Vitals:  ? 08/08/21 0840  ?TempSrc: Temporal  ?PainSc:   ? ? ?  ?  ?  ?  ?  ?  ? ?[name] ? ? ? ? ?

## 2021-08-08 NOTE — Transfer of Care (Signed)
Immediate Anesthesia Transfer of Care Note ? ?Patient: Ryan Willis. ? ?Procedure(s) Performed: ESOPHAGOGASTRODUODENOSCOPY (EGD) WITH PROPOFOL ? ?Patient Location: PACU and Endoscopy Unit ? ?Anesthesia Type:General ? ?Level of Consciousness: drowsy and patient cooperative ? ?Airway & Oxygen Therapy: Patient Spontanous Breathing ? ?Post-op Assessment: Report given to RN and Post -op Vital signs reviewed and stable ? ?Post vital signs: Reviewed and stable ? ?Last Vitals:  ?Vitals Value Taken Time  ?BP 108/63 08/08/21 0847  ?Temp    ?Pulse 54 08/08/21 0849  ?Resp 15 08/08/21 0849  ?SpO2 95 % 08/08/21 0849  ?Vitals shown include unvalidated device data. ? ?Last Pain:  ?Vitals:  ? 08/08/21 0840  ?TempSrc: Temporal  ?PainSc:   ?   ? ?  ? ?Complications: No notable events documented. ?

## 2021-08-08 NOTE — Anesthesia Preprocedure Evaluation (Signed)
Anesthesia Evaluation  ?Patient identified by MRN, date of birth, ID band ?Patient awake ? ? ? ?Reviewed: ?Allergy & Precautions, H&P , NPO status , Patient's Chart, lab work & pertinent test results, reviewed documented beta blocker date and time  ? ?Airway ?Mallampati: II ? ? ?Neck ROM: full ? ? ? Dental ? ?(+) Poor Dentition ?  ?Pulmonary ?neg pulmonary ROS,  ?  ?Pulmonary exam normal ? ? ? ? ? ? ? Cardiovascular ?negative cardio ROS ?Normal cardiovascular exam ?Rhythm:regular Rate:Normal ? ? ?  ?Neuro/Psych ?negative neurological ROS ? negative psych ROS  ? GI/Hepatic ?negative GI ROS, Neg liver ROS,   ?Endo/Other  ?Hypothyroidism  ? Renal/GU ?Renal disease  ?negative genitourinary ?  ?Musculoskeletal ? ? Abdominal ?  ?Peds ? Hematology ?negative hematology ROS ?(+)   ?Anesthesia Other Findings ?Past Medical History: ?No date: Cirrhosis of liver ([place]) ?No date: Diverticulitis ?No date: Family history of adverse reaction to anesthesia ?    Comment:  MOTHER HAD N/V ?No date: History of kidney stones ?No date: Hypercholesteremia ?No date: Hypothyroidism ?No date: Peyronie's disease ?No date: Renal cyst, left ?No date: Seasonal allergies ?No date: Thrombocytopenia ([place]) ?Past Surgical History: ?No date: CHOLECYSTECTOMY ?2006: COLONOSCOPY ?09/08/2017: COLONOSCOPY WITH PROPOFOL; N/A ?    Comment:  Procedure: COLONOSCOPY WITH PROPOFOL;  Surgeon: Vicente Males,  ?             Bailey Mech, MD;  Location: [place];  Service:  ?             Gastroenterology;  Laterality: N/A; ?01/01/2018: COLONOSCOPY WITH PROPOFOL; N/A ?    Comment:  Procedure: COLONOSCOPY WITH PROPOFOL;  Surgeon: Vicente Males,  ?             Bailey Mech, MD;  Location: [place];  Service:  ?             Gastroenterology;  Laterality: N/A; ?01/17/2019: CYSTOSCOPY/URETEROSCOPY/HOLMIUM LASER/STENT PLACEMENT;  ?Right ?    Comment:  Procedure: CYSTOSCOPY/URETEROSCOPY/STENT PLACEMENT;   ?             Surgeon: Hollice Espy, MD;  Location: [facility]  ORS;   ?             Service: Urology;  Laterality: Right; ?01/31/2019: CYSTOSCOPY/URETEROSCOPY/HOLMIUM LASER/STENT PLACEMENT;  ?Right ?    Comment:  Procedure: CYSTOSCOPY/URETEROSCOPY/HOLMIUM LASER/STENT  ?             EXCHANGE;  Surgeon: Hollice Espy, MD;  Location: [facility]  ?             ORS;  Service: Urology;  Laterality: Right; ?08/13/2020: CYSTOSCOPY/URETEROSCOPY/HOLMIUM LASER/STENT PLACEMENT; Left ?    Comment:  Procedure: CYSTOSCOPY/URETEROSCOPY/HOLMIUM LASER/STENT  ?             PLACEMENT;  Surgeon: Hollice Espy, MD;  Location: [facility] ?             ORS;  Service: Urology;  Laterality: Left; ?09/08/2017: ESOPHAGOGASTRODUODENOSCOPY (EGD) WITH PROPOFOL; N/A ?    Comment:  Procedure: ESOPHAGOGASTRODUODENOSCOPY (EGD) WITH  ?             PROPOFOL;  Surgeon: [name], MD;  Location: [facility]  ?             ENDOSCOPY;  Service: Gastroenterology;  Laterality: N/A; ?05/31/2020: ESOPHAGOGASTRODUODENOSCOPY (EGD) WITH PROPOFOL; N/A ?    Comment:  Procedure: ESOPHAGOGASTRODUODENOSCOPY (EGD) WITH  ?             PROPOFOL;  Surgeon: [name], MD;  Location: [facility]  ?  ENDOSCOPY;  Service: Gastroenterology;  Laterality: N/A; ?07/05/2021: ESOPHAGOGASTRODUODENOSCOPY (EGD) WITH PROPOFOL; N/A ?    Comment:  Procedure: ESOPHAGOGASTRODUODENOSCOPY (EGD) WITH  ?             PROPOFOL;  Surgeon: [name], MD;  Location: [facility]  ?             ENDOSCOPY;  Service: Gastroenterology;  Laterality: N/A;  ?             WIFE WILL BE DRIVER: WILL BE 20 - 30 MINUTES AWAY ?1981: HERNIA REPAIR; Right ?    Comment:  inguinal ?04/19/2019: INTRAOPERATIVE CHOLANGIOGRAM; N/A ?    Comment:  Procedure: INTRAOPERATIVE CHOLANGIOGRAM;  Surgeon:  ?             Jules Husbands, MD;  Location: [facility] ORS;  Service:  ?             General;  Laterality: N/A; ?2001: SHOULDER SURGERY; Right ?    Comment:  ROTATOR CUFF REPAIR ?10/27/2019: VENTRAL HERNIA REPAIR; N/A ?    Comment:  Procedure: HERNIA REPAIR VENTRAL ADULT, Open;  Surgeon:  ?             Jules Husbands, MD;  Location: [facility] ORS;  Service:  ?             General;  Laterality: N/A; ?BMI   ? Body Mass Index: 29.05 kg/m?  ?  ? Reproductive/Obstetrics ?negative OB ROS ? ?  ? ? ? ? ? ? ? ? ? ? ? ? ? ?  ?  ? ? ? ? ? ? ? ? ?Anesthesia Physical ?Anesthesia Plan ? ?ASA: 3 ? ?Anesthesia Plan: General  ? ?Post-op Pain Management:   ? ?Induction:  ? ?PONV Risk Score and Plan:  ? ?Airway Management Planned:  ? ?Additional Equipment:  ? ?Intra-op Plan:  ? ?Post-operative Plan:  ? ?Informed Consent: I have reviewed the patients History and Physical, chart, labs and discussed the procedure including the risks, benefits and alternatives for the proposed anesthesia with the patient or authorized representative who has indicated his/her understanding and acceptance.  ? ? ? ?Dental Advisory Given ? ?Plan Discussed with: CRNA ? ?Anesthesia Plan Comments:   ? ? ? ? ? ? ?Anesthesia Quick Evaluation ? ?

## 2021-08-08 NOTE — Op Note (Signed)
[facility] ?Gastroenterology ?Patient Name: Ryan Willis ?Procedure Date: 08/08/2021 8:25 AM ?MRN: [id number] ?Account #: [id number] ?Date of Birth: [birth year] ?Admit Type: Outpatient ?Age: 61 ?Room: [facility] ENDO ROOM 3 ?Gender: Male ?Note Status: Finalized ?Instrument Name: Upper Endoscope [id number] ?Procedure:             Upper GI endoscopy ?Indications:           Follow-up of esophageal varices ?Providers:             [name] MD, MD ?Referring MD:          Forest Gleason Md, MD (Referring MD) ?Medicines:             Monitored Anesthesia Care ?Complications:         No immediate complications. ?Procedure:             Pre-Anesthesia Assessment: ?                       - Prior to the procedure, a History and Physical was  ?                       performed, and patient medications, allergies and  ?                       sensitivities were reviewed. The patient's tolerance  ?                       of previous anesthesia was reviewed. ?                       - The risks and benefits of the procedure and the  ?                       sedation options and risks were discussed with the  ?                       patient. All questions were answered and informed  ?                       consent was obtained. ?                       - ASA Grade Assessment: III - A patient with severe  ?                       systemic disease. ?                       After obtaining informed consent, the endoscope was  ?                       passed under direct vision. Throughout the procedure,  ?                       the patient's blood pressure, pulse, and oxygen  ?                       saturations were monitored continuously. The Endoscope  ?  was introduced through the mouth, and advanced to the  ?                       third part of duodenum. The upper GI endoscopy was  ?                       accomplished with ease. The patient tolerated the  ?                       procedure well. ?Findings: ?     The  examined duodenum was normal. ?     Mild portal hypertensive gastropathy was found in the entire examined  ?     stomach. ?     The cardia and gastric fundus were normal on retroflexion. ?     Grade II varices were found in the lower third of the esophagus. They  ?     were medium in size. Two bands were successfully placed with incomplete  ?     eradication of varices. There was no bleeding during the procedure. ?Impression:            - Normal examined duodenum. ?                       - Portal hypertensive gastropathy. ?                       - Grade II esophageal varices. Incompletely  ?                       eradicated. Banded. ?                       - No specimens collected. ?Recommendation:        - Discharge patient to home (with escort). ?                       - Resume previous diet. ?                       - Continue present medications. ?                       - Repeat upper endoscopy in 4 weeks for surveillance. ?Procedure Code(s):     --- Professional --- ?                       [phone], Esophagogastroduodenoscopy, flexible,  ?                       transoral; with band ligation of esophageal/gastric  ?                       varices ?Diagnosis Code(s):     --- Professional --- ?                       K76.6, Portal hypertension ?                       K31.89, Other diseases of stomach and duodenum ?  I85.00, Esophageal varices without bleeding ?CPT copyright 2019 [employer]. All rights reserved. ?The codes documented in this report are preliminary and upon coder review may  ?be revised to meet current compliance requirements. ?[name], MD ?[name] MD, MD ?08/08/2021 8:42:55 AM ?This report has been signed electronically. ?Number of Addenda: 0 ?Note Initiated On: 08/08/2021 8:25 AM ?Estimated Blood Loss:  Estimated blood loss: none. ?     [facility] ?

## 2021-08-09 ENCOUNTER — Encounter: Payer: Self-pay | Admitting: Gastroenterology

## 2021-09-11 DIAGNOSIS — H524 Presbyopia: Secondary | ICD-10-CM | POA: Diagnosis not present

## 2021-09-13 ENCOUNTER — Telehealth: Payer: Self-pay

## 2021-09-13 NOTE — Telephone Encounter (Signed)
Last visit on 03/18/22 with follow up plan of6 months; MD, labs -cbc/cmp/AFP/Pt/PTT at [place].  Confirmed with patient's wife that he did NOT go for the lab drawn at [facility].  He will go for lab drawn on Monday.  Please r/s the 09/16/21 MD appt to 09/18/21 @ 10:30.  Patient/wife are aware of appt details.

## 2021-09-16 ENCOUNTER — Inpatient Hospital Stay: Payer: 59 | Admitting: Internal Medicine

## 2021-09-16 DIAGNOSIS — Z7689 Persons encountering health services in other specified circumstances: Secondary | ICD-10-CM | POA: Diagnosis not present

## 2021-09-18 ENCOUNTER — Inpatient Hospital Stay: Payer: 59 | Attending: Internal Medicine | Admitting: Internal Medicine

## 2021-09-18 ENCOUNTER — Encounter: Payer: Self-pay | Admitting: Internal Medicine

## 2021-09-18 DIAGNOSIS — E039 Hypothyroidism, unspecified: Secondary | ICD-10-CM | POA: Diagnosis not present

## 2021-09-18 DIAGNOSIS — R161 Splenomegaly, not elsewhere classified: Secondary | ICD-10-CM | POA: Insufficient documentation

## 2021-09-18 DIAGNOSIS — K766 Portal hypertension: Secondary | ICD-10-CM | POA: Diagnosis not present

## 2021-09-18 DIAGNOSIS — K7460 Unspecified cirrhosis of liver: Secondary | ICD-10-CM | POA: Diagnosis not present

## 2021-09-18 DIAGNOSIS — D696 Thrombocytopenia, unspecified: Secondary | ICD-10-CM | POA: Diagnosis not present

## 2021-09-18 NOTE — Progress Notes (Signed)
[place] CONSULT NOTE  Patient Care Team: Patient, No Pcp Per (Inactive) as PCP - General ([place]) Cammie Sickle, MD as Consulting Physician (Oncology)  CHIEF COMPLAINTS/PURPOSE OF CONSULTATION: Thrombocytopenia  # THROMBOCYTOPENIA [97-108; incidental]; Hep B/hepC/HIV-NEG [[facility]] sec to cirrhosis/ splenomegaly  # Cirrhosis/ SPLENOMEGALY[US- 1000cc; CT A/P march 2019] s/p EGD/colo [may 2019- Dr.Anna]  #October 2020-kidney stones s/p stenting [Dr. Erlene Quan; gallstone/cholecystitis-s/p cholecystectomy December 2020]  # Peyronie disease-   Oncology History   No history exists.   HISTORY OF PRESENTING ILLNESS: Ambulating independently.  Alone.  Sharlot Gowda. 61 y.o.  male cirrhosis/splenomegaly and thrombocytopenia is here for follow-up/ review of labs.   Patient has not had any hospitalizations.  No swelling of the legs.  Patient overall feels good.  Denies any blood in stools or black-colored stools.  No gum bleeding no nosebleeds.  Review of Systems  Constitutional:  Positive for weight loss. Negative for chills, diaphoresis, fever and malaise/fatigue.  HENT:  Negative for nosebleeds and sore throat.   Eyes:  Negative for double vision.  Respiratory:  Negative for cough, hemoptysis, sputum production, shortness of breath and wheezing.   Cardiovascular:  Negative for chest pain, palpitations, orthopnea and leg swelling.  Gastrointestinal:  Negative for abdominal pain, blood in stool, constipation, diarrhea, heartburn, melena, nausea and vomiting.  Genitourinary:  Negative for dysuria, frequency and urgency.  Musculoskeletal:  Negative for back pain and joint pain.  Skin: Negative.  Negative for itching and rash.  Neurological:  Negative for dizziness, tingling, focal weakness, weakness and headaches.  Endo/Heme/Allergies:  Bruises/bleeds easily.  Psychiatric/Behavioral:  Negative for depression. The patient is not nervous/anxious and does not  have insomnia.    MEDICAL HISTORY:  Past Medical History:  Diagnosis Date   Cirrhosis of liver ([place])    Diverticulitis    Family history of adverse reaction to anesthesia    MOTHER HAD N/V   History of kidney stones    Hypercholesteremia    Hypothyroidism    Peyronie's disease    Renal cyst, left    Seasonal allergies    Thrombocytopenia ([place])     SURGICAL HISTORY: Past Surgical History:  Procedure Laterality Date   CHOLECYSTECTOMY     COLONOSCOPY  2006   COLONOSCOPY WITH PROPOFOL N/A 09/08/2017   Procedure: COLONOSCOPY WITH PROPOFOL;  Surgeon: [name], MD;  Location: [facility] ENDOSCOPY;  Service: Gastroenterology;  Laterality: N/A;   COLONOSCOPY WITH PROPOFOL N/A 01/01/2018   Procedure: COLONOSCOPY WITH PROPOFOL;  Surgeon: [name], MD;  Location: [facility] ENDOSCOPY;  Service: Gastroenterology;  Laterality: N/A;   CYSTOSCOPY/URETEROSCOPY/HOLMIUM LASER/STENT PLACEMENT Right 01/17/2019   Procedure: CYSTOSCOPY/URETEROSCOPY/STENT PLACEMENT;  Surgeon: Hollice Espy, MD;  Location: [facility] ORS;  Service: Urology;  Laterality: Right;   CYSTOSCOPY/URETEROSCOPY/HOLMIUM LASER/STENT PLACEMENT Right 01/31/2019   Procedure: CYSTOSCOPY/URETEROSCOPY/HOLMIUM LASER/STENT EXCHANGE;  Surgeon: Hollice Espy, MD;  Location: [facility] ORS;  Service: Urology;  Laterality: Right;   CYSTOSCOPY/URETEROSCOPY/HOLMIUM LASER/STENT PLACEMENT Left 08/13/2020   Procedure: CYSTOSCOPY/URETEROSCOPY/HOLMIUM LASER/STENT PLACEMENT;  Surgeon: Hollice Espy, MD;  Location: [facility] ORS;  Service: Urology;  Laterality: Left;   ESOPHAGOGASTRODUODENOSCOPY (EGD) WITH PROPOFOL N/A 09/08/2017   Procedure: ESOPHAGOGASTRODUODENOSCOPY (EGD) WITH PROPOFOL;  Surgeon: [name], MD;  Location: [facility] ENDOSCOPY;  Service: Gastroenterology;  Laterality: N/A;   ESOPHAGOGASTRODUODENOSCOPY (EGD) WITH PROPOFOL N/A 05/31/2020   Procedure: ESOPHAGOGASTRODUODENOSCOPY (EGD) WITH PROPOFOL;  Surgeon: [name], MD;  Location: [facility] ENDOSCOPY;  Service:  Gastroenterology;  Laterality: N/A;   ESOPHAGOGASTRODUODENOSCOPY (EGD) WITH PROPOFOL N/A 07/05/2021   Procedure: ESOPHAGOGASTRODUODENOSCOPY (EGD) WITH  PROPOFOL;  Surgeon: [name], MD;  Location: [facility] ENDOSCOPY;  Service: Gastroenterology;  Laterality: N/A;  WIFE WILL BE DRIVER: WILL BE 20 - 30 MINUTES AWAY   ESOPHAGOGASTRODUODENOSCOPY (EGD) WITH PROPOFOL N/A 08/08/2021   Procedure: ESOPHAGOGASTRODUODENOSCOPY (EGD) WITH PROPOFOL;  Surgeon: [name], MD;  Location: [facility] ENDOSCOPY;  Service: Gastroenterology;  Laterality: N/A;   HERNIA REPAIR Right 1981   inguinal   INTRAOPERATIVE CHOLANGIOGRAM N/A 04/19/2019   Procedure: INTRAOPERATIVE CHOLANGIOGRAM;  Surgeon: Jules Husbands, MD;  Location: [facility] ORS;  Service: General;  Laterality: N/A;   SHOULDER SURGERY Right 2001   ROTATOR CUFF REPAIR   VENTRAL HERNIA REPAIR N/A 10/27/2019   Procedure: HERNIA REPAIR VENTRAL ADULT, Open;  Surgeon: Jules Husbands, MD;  Location: [facility] ORS;  Service: General;  Laterality: N/A;    SOCIAL HISTORY:  Social History   Socioeconomic History   Marital status: Married    Spouse name: Not on file   Number of children: Not on file   Years of education: Not on file   Highest education level: Not on file  Occupational History   Not on file  Tobacco Use   Smoking status: Never   Smokeless tobacco: Never  Vaping Use   Vaping Use: Never used  Substance and Sexual Activity   Alcohol use: Not Currently    Comment: "1 or 2 year drinks a year."   Drug use: No   Sexual activity: Not on file  Other Topics Concern   Not on file  Social History Narrative   retd. Engineer, structural; part time information specialist; never smoked; no alcohol; live in [place]. Lives with wife.    Social Determinants of Health   Financial Resource Strain: Not on file  Food Insecurity: Not on file  Transportation Needs: Not on file  Physical Activity: Not on file  Stress: Not on file  Social Connections: Not on file  Intimate Partner  Violence: Not on file    FAMILY HISTORY: colon father- 12 year; mother- kidney cancer; died of lung cancer [2018] Family History  Problem Relation Age of Onset   Kidney cancer Mother    Lung cancer Mother    Colon cancer Father     ALLERGIES:  has No Known Allergies.  MEDICATIONS:  Current Outpatient Medications  Medication Sig Dispense Refill   lactulose (CHRONULAC) 10 GM/15ML solution Take by mouth 3 (three) times daily as needed for mild constipation.     Multiple Vitamin (MULTIVITAMIN WITH MINERALS) TABS tablet Take 1 tablet by mouth daily.     nadolol (CORGARD) 20 MG tablet TAKE 1 TABLET(20 MG) BY MOUTH DAILY 90 tablet 3   No current facility-administered medications for this visit.   PHYSICAL EXAMINATION: ECOG PERFORMANCE STATUS: 0 - Asymptomatic  Vitals:   09/18/21 1029  BP: 136/69  Pulse: (!) 55  Temp: (!) 97 F (36.1 C)  SpO2: 98%   Filed Weights   09/18/21 1029  Weight: 185 lb 12.5 oz (84.3 kg)   Physical Exam HENT:     Head: Normocephalic and atraumatic.     Mouth/Throat:     Pharynx: No oropharyngeal exudate.  Eyes:     Pupils: Pupils are equal, round, and reactive to light.  Cardiovascular:     Rate and Rhythm: Normal rate and regular rhythm.  Pulmonary:     Effort: No respiratory distress.     Breath sounds: No wheezing.  Abdominal:     General: Bowel sounds are normal. There is no distension.  Palpations: Abdomen is soft. There is no mass.     Tenderness: There is no abdominal tenderness. There is no guarding or rebound.  Musculoskeletal:        General: No tenderness. Normal range of motion.     Cervical back: Normal range of motion and neck supple.  Skin:    General: Skin is warm.  Neurological:     Mental Status: He is alert and oriented to person, place, and time.  Psychiatric:        Mood and Affect: Affect normal.     LABORATORY DATA:  I have reviewed the data as listed Lab Results  Component Value Date   WBC 2.6 (L)  08/24/2020   HGB 13.1 08/24/2020   HCT 36.4 (L) 08/24/2020   MCV 96 08/24/2020   PLT 64 (LL) 08/24/2020   No results for input(s): NA, K, CL, CO2, GLUCOSE, BUN, CREATININE, CALCIUM, GFRNONAA, GFRAA, PROT, ALBUMIN, AST, ALT, ALKPHOS, BILITOT, BILIDIR, IBILI in the last 8760 hours.   RADIOGRAPHIC STUDIES: I have personally reviewed the radiological images as listed and agreed with the findings in the report. No results found.  ASSESSMENT & PLAN:   Thrombocytopenia ([place]) #Leucopenia/ Thrombocytopenia/splenomegaly-Hypersplenism- [January 2019 platelets- between 106-97]-MAY 2023-WBC- 2.3/ANC1.5;Hb -13 platelets 52 Trending down but overall clinically    STABLE; Asymptomatic.  Continue surveillance.  # Cirrhosis/splenomegaly-MRI in May 2020; Oct 2021 CT A/P [Dr.Anna] GI-AFP-10 [n-8.3]; monitor for now; MRI JAN 2023- No enhancing lesion in the liver. No evidence of hepatocellular carcinoma. Portal hypertension with paraesophageal varices and splenomegaly. No ascites; June 2023 US-Pending [Dr.Anna].    # DISPOSITION:  # follow up in 1st week of dec; MD, labs -cbc/cmp/AFP/PT/PTT [[facility]]; - Dr.B  All questions were answered. The patient knows to call the clinic with any problems, questions or concerns.    Cammie Sickle, MD 09/23/2021 7:55 PM

## 2021-09-18 NOTE — Assessment & Plan Note (Addendum)
#  Leucopenia/ Thrombocytopenia/splenomegaly-Hypersplenism- [January 2019 platelets- between 106-97]-MAY 2023-WBC- 2.3/ANC1.5;Hb -13 platelets 52 Trending down but overall clinically    STABLE; Asymptomatic.  Continue surveillance.  # Cirrhosis/splenomegaly-MRI in May 2020; Oct 2021 CT A/P [Dr.Anna] GI-AFP-10 [n-8.3]; monitor for now; MRI JAN 2023- No enhancing lesion in the liver. No evidence of hepatocellular carcinoma. Portal hypertension with paraesophageal varices and splenomegaly. No ascites; June 2023 US-Pending [Dr.Anna].   # DISPOSITION:  # follow up in 1st week of dec; MD, labs -cbc/cmp/AFP/PT/PTT [[facility]]; - Dr.B

## 2021-09-24 ENCOUNTER — Ambulatory Visit
Admission: RE | Admit: 2021-09-24 | Discharge: 2021-09-24 | Disposition: A | Discharge status: Home / Self Care | Payer: 59 | Attending: Urology | Admitting: Urology

## 2021-09-24 ENCOUNTER — Encounter: Payer: Self-pay | Admitting: Internal Medicine

## 2021-09-24 ENCOUNTER — Ambulatory Visit
Admission: RE | Admit: 2021-09-24 | Discharge: 2021-09-24 | Disposition: A | Discharge status: Home / Self Care | Payer: 59 | Source: Ambulatory Visit | Attending: Urology | Admitting: Urology

## 2021-09-24 ENCOUNTER — Ambulatory Visit (INDEPENDENT_AMBULATORY_CARE_PROVIDER_SITE_OTHER): Payer: 59 | Admitting: Urology

## 2021-09-24 VITALS — BP 133/86 | HR 54 | Ht 66.0 in | Wt 180.0 lb

## 2021-09-24 DIAGNOSIS — Z87442 Personal history of urinary calculi: Secondary | ICD-10-CM | POA: Diagnosis not present

## 2021-09-24 DIAGNOSIS — N200 Calculus of kidney: Secondary | ICD-10-CM | POA: Diagnosis not present

## 2021-09-24 DIAGNOSIS — I878 Other specified disorders of veins: Secondary | ICD-10-CM | POA: Diagnosis not present

## 2021-09-24 DIAGNOSIS — N201 Calculus of ureter: Secondary | ICD-10-CM

## 2021-09-24 NOTE — Progress Notes (Signed)
09/24/2021 12:49 PM   Ryan Willis. 1960-05-31 [id number]  Referring provider:  No referring provider defined for this encounter. Chief Complaint  Patient presents with   Nephrolithiasis   HPI: Ryan Willis. is a 61 y.o.male with a personal history of kidney stones, left ureteral stone and BPH with weak urinary stream who presents today for a 1 year follow-up with KUB.   He is s/p right-sided ureteroscopy in 2020. Stone compositions was calcium oxalate.   He was found to have a 10 mm UPJ stone along with several nonobstructing left-sided stones.  He was taken to the operating room on 08/13/2020 for ureteroscopic intervention.  Notably, the left UPJ with the stone was lodged was quite tight and a mucosal abrasion was appreciated at the end of the procedure with some mild contrast extravasation.The stent was left for prolonged period of time and ultimately removed on 09/05/2020.  RUS on 10/11/2020 visualized  no hydronephrosis bilaterally and bilateral ureteral jets.  Left renal cyst is appreciated.  No obvious stone burden.   KUB was personally reviewed and interpreted a visualized a very small kidney stone in the left kidney.   He denies any flank pain, gross hematuria or any other signs or symptoms of urinary tract stones.  Drinking plenty of water.   PMH: Past Medical History:  Diagnosis Date   Cirrhosis of liver ([place])    Diverticulitis    Family history of adverse reaction to anesthesia    MOTHER HAD N/V   History of kidney stones    Hypercholesteremia    Hypothyroidism    Peyronie's disease    Renal cyst, left    Seasonal allergies    Thrombocytopenia ([place])     Surgical History: Past Surgical History:  Procedure Laterality Date   CHOLECYSTECTOMY     COLONOSCOPY  2006   COLONOSCOPY WITH PROPOFOL N/A 09/08/2017   Procedure: COLONOSCOPY WITH PROPOFOL;  Surgeon: [name], MD;  Location: [facility] ENDOSCOPY;  Service: Gastroenterology;  Laterality: N/A;   COLONOSCOPY  WITH PROPOFOL N/A 01/01/2018   Procedure: COLONOSCOPY WITH PROPOFOL;  Surgeon: [name], MD;  Location: [facility] ENDOSCOPY;  Service: Gastroenterology;  Laterality: N/A;   CYSTOSCOPY/URETEROSCOPY/HOLMIUM LASER/STENT PLACEMENT Right 01/17/2019   Procedure: CYSTOSCOPY/URETEROSCOPY/STENT PLACEMENT;  Surgeon: Hollice Espy, MD;  Location: [facility] ORS;  Service: Urology;  Laterality: Right;   CYSTOSCOPY/URETEROSCOPY/HOLMIUM LASER/STENT PLACEMENT Right 01/31/2019   Procedure: CYSTOSCOPY/URETEROSCOPY/HOLMIUM LASER/STENT EXCHANGE;  Surgeon: Hollice Espy, MD;  Location: [facility] ORS;  Service: Urology;  Laterality: Right;   CYSTOSCOPY/URETEROSCOPY/HOLMIUM LASER/STENT PLACEMENT Left 08/13/2020   Procedure: CYSTOSCOPY/URETEROSCOPY/HOLMIUM LASER/STENT PLACEMENT;  Surgeon: Hollice Espy, MD;  Location: [facility] ORS;  Service: Urology;  Laterality: Left;   ESOPHAGOGASTRODUODENOSCOPY (EGD) WITH PROPOFOL N/A 09/08/2017   Procedure: ESOPHAGOGASTRODUODENOSCOPY (EGD) WITH PROPOFOL;  Surgeon: [name], MD;  Location[facility] ENDOSCOPY;  Service: Gastroenterology;  Laterality: N/A;   ESOPHAGOGASTRODUODENOSCOPY (EGD) WITH PROPOFOL N/A 05/31/2020   Procedure: ESOPHAGOGASTRODUODENOSCOPY (EGD) WITH PROPOFOL;  Surgeon: [name], MD;  Location: [facility] ENDOSCOPY;  Service: Gastroenterology;  Laterality: N/A;   ESOPHAGOGASTRODUODENOSCOPY (EGD) WITH PROPOFOL N/A 07/05/2021   Procedure: ESOPHAGOGASTRODUODENOSCOPY (EGD) WITH PROPOFOL;  Surgeon: [name], MD;  Location: [facility] ENDOSCOPY;  Service: Gastroenterology;  Laterality: N/A;  WIFE WILL BE DRIVER: WILL BE 20 - 30 MINUTES AWAY   ESOPHAGOGASTRODUODENOSCOPY (EGD) WITH PROPOFOL N/A 08/08/2021   Procedure: ESOPHAGOGASTRODUODENOSCOPY (EGD) WITH PROPOFOL;  Surgeon: [name], MD;  Location: [facility] ENDOSCOPY;  Service: Gastroenterology;  Laterality: N/A;   HERNIA REPAIR Right 1981   inguinal  INTRAOPERATIVE CHOLANGIOGRAM N/A 04/19/2019   Procedure: INTRAOPERATIVE CHOLANGIOGRAM;  Surgeon:  Jules Husbands, MD;  Location: [facility] ORS;  Service: General;  Laterality: N/A;   SHOULDER SURGERY Right 2001   ROTATOR CUFF REPAIR   VENTRAL HERNIA REPAIR N/A 10/27/2019   Procedure: HERNIA REPAIR VENTRAL ADULT, Open;  Surgeon: Jules Husbands, MD;  Location: [facility] ORS;  Service: General;  Laterality: N/A;    Home Medications:  Allergies as of 09/24/2021   No Known Allergies      Medication List        Accurate as of Sep 24, 2021 12:49 PM. If you have any questions, ask your nurse or doctor.          lactulose 10 GM/15ML solution Commonly known as: CHRONULAC Take by mouth 3 (three) times daily as needed for mild constipation.   multivitamin with minerals Tabs tablet Take 1 tablet by mouth daily.   nadolol 20 MG tablet Commonly known as: CORGARD TAKE 1 TABLET(20 MG) BY MOUTH DAILY        Allergies:  No Known Allergies  Family History: Family History  Problem Relation Age of Onset   Kidney cancer Mother    Lung cancer Mother    Colon cancer Father     Social History:  reports that he has never smoked. He has never used smokeless tobacco. He reports that he does not currently use alcohol. He reports that he does not use drugs.   Physical Exam: BP 133/86   Pulse (!) 54   Ht 5' 6"  (1.676 m)   Wt 180 lb (81.6 kg)   BMI 29.05 kg/m   Constitutional:  Alert and oriented, No acute distress. HEENT: [place] AT, moist mucus membranes.  Trachea midline, no masses. Cardiovascular: No clubbing, cyanosis, or edema. Respiratory: Normal respiratory effort, no increased work of breathing. Skin: No rashes, bruises or suspicious lesions. Neurologic: Grossly intact, no focal deficits, moving all 4 extremities. Psychiatric: Normal mood and affect.  Laboratory Data:  Lab Results  Component Value Date   CREATININE 0.88 08/24/2020   KUB was personally reviewed today, minimal to no urinary tract stone disease.  Possible punctate nonobstructing stone on the left otherwise negative.   I personally compared it to the previous scans.  Assessment & Plan:    History of nephrolithiasis - Status post complex left ureteroscopy with prolonged ureteral stent - KUB was personally reviewed today and is essentially stable with minimal to no urinary tract stone disease   Plan for KUB in 2 years or sooner as needed  Conley Rolls as a scribe for Hollice Espy, MD.,have documented all relevant documentation on the behalf of Hollice Espy, MD,as directed by  Hollice Espy, MD while in the presence of Hollice Espy, MD.  I have reviewed the above documentation for accuracy and completeness, and I agree with the above.   Hollice Espy, MD    [facility] [address] [place], [place] [address] [phone]

## 2021-09-26 ENCOUNTER — Ambulatory Visit
Admission: RE | Admit: 2021-09-26 | Discharge: 2021-09-26 | Disposition: A | Discharge status: Home / Self Care | Payer: 59 | Source: Ambulatory Visit | Attending: Gastroenterology | Admitting: Gastroenterology

## 2021-09-26 DIAGNOSIS — K7460 Unspecified cirrhosis of liver: Secondary | ICD-10-CM | POA: Diagnosis not present

## 2021-10-09 ENCOUNTER — Ambulatory Visit: Payer: Self-pay | Admitting: Urology

## 2021-12-16 ENCOUNTER — Encounter: Payer: Self-pay | Admitting: Gastroenterology

## 2021-12-16 ENCOUNTER — Ambulatory Visit (INDEPENDENT_AMBULATORY_CARE_PROVIDER_SITE_OTHER): Payer: 59 | Admitting: Gastroenterology

## 2021-12-16 ENCOUNTER — Other Ambulatory Visit: Payer: Self-pay

## 2021-12-16 VITALS — BP 124/68 | HR 59 | Temp 98.2°F | Ht 66.0 in | Wt 187.5 lb

## 2021-12-16 DIAGNOSIS — R772 Abnormality of alphafetoprotein: Secondary | ICD-10-CM | POA: Diagnosis not present

## 2021-12-16 DIAGNOSIS — Z23 Encounter for immunization: Secondary | ICD-10-CM | POA: Diagnosis not present

## 2021-12-16 DIAGNOSIS — I8500 Esophageal varices without bleeding: Secondary | ICD-10-CM

## 2021-12-16 DIAGNOSIS — K7460 Unspecified cirrhosis of liver: Secondary | ICD-10-CM | POA: Diagnosis not present

## 2021-12-16 NOTE — Patient Instructions (Signed)
Your MRI is schedule for 12/23/2021 at 11:00am. Please arrive at 10:30am at the medical mall. Nothing to eat or drink 4 hours before the scan. If you need to reschedule call [phone] option 3 and then option 2.

## 2021-12-16 NOTE — Addendum Note (Signed)
Addended by: Ulyess Blossom L on: 12/16/2021 01:13 PM   Modules accepted: Orders

## 2021-12-16 NOTE — Progress Notes (Signed)
[name] MD, MRCP(U.K) [address]  [place]  [place], [place] [address]  Main: [phone]  Fax: [phone]   Primary Care Physician: Patient, No Pcp Per  Primary Gastroenterologist:  Dr. [name]   Chief Complaint  Patient presents with   Cirrhosis    HPI: Ryan Willis. is a 61 y.o. male  Summary of history :   H/o  liver cirrhosis secondary to NAFLD with non bleeding esophageal varices grade 1     CT scan on 07/15/17 which showed features of cirrhosis and portal hypertension with with splenomegaly, a recanalized left paraumbilical vein and small distal esophageal varices. His dad had colon cancer..    01/01/18 : colonoscopy- 3 mm tubular adenoma resected  03/2020 : Underwent cholecystectomy  03/19/2020 seen Dr. Rogue Bussing for thrombocytopenia.   04/23/2020: CT abdomen and pelvis: stable features of cirrhosis , moderate stool burden .  05/31/2020: EGD: Grade 1 esophageal varices. Commenced on 20 mg Nadolol.  05/22/2021: MRI liver: no liver mass     Interval history 12/26/2020-06/18/2021 09/26/2021: Cirrhosis of the liver but no liver mass. 09/16/2021: Hemoglobin 13.1 creatinine 0.75 INR 1.2 and bilirubin 1.0 normal transaminases.  AFP 9.8. 08/08/2021: Grade 2 esophageal varices seen 2 bands placed.  Recommended repeat upper endoscopy in 4 weeks.  NSAID's: no  Low Salt Diet :  Yes Sleep pattern/confusion/memory issues: No issues Bowel  movements : Takes lactulose as neede Heart rate around 59 bpm on nadolol 20 mg once a day No other complaints    Current Outpatient Medications  Medication Sig Dispense Refill   lactulose (CHRONULAC) 10 GM/15ML solution Take by mouth 3 (three) times daily as needed for mild constipation.     Multiple Vitamin (MULTIVITAMIN WITH MINERALS) TABS tablet Take 1 tablet by mouth daily.     nadolol (CORGARD) 20 MG tablet TAKE 1 TABLET(20 MG) BY MOUTH DAILY 90 tablet 3   No current facility-administered medications for this visit.     Allergies as of 12/16/2021   (No Known Allergies)    ROS:  General: Negative for anorexia, weight loss, fever, chills, fatigue, weakness. ENT: Negative for hoarseness, difficulty swallowing , nasal congestion. CV: Negative for chest pain, angina, palpitations, dyspnea on exertion, peripheral edema.  Respiratory: Negative for dyspnea at rest, dyspnea on exertion, cough, sputum, wheezing.  GI: See history of present illness. GU:  Negative for dysuria, hematuria, urinary incontinence, urinary frequency, nocturnal urination.  Endo: Negative for unusual weight change.    Physical Examination:   BP 124/68 (BP Location: Left Arm, Patient Position: Sitting, Cuff Size: Normal)   Pulse (!) 59   Temp 98.2 F (36.8 C) (Oral)   Ht 5' 6"  (1.676 m)   Wt 187 lb 8 oz (85 kg)   BMI 30.26 kg/m   General: Well-nourished, well-developed in no acute distress.  Neuro: Alert and oriented x 3.  Grossly intact. Skin: Warm and dry, no jaundice.   Psych: Alert and cooperative, normal mood and affect.   Imaging Studies: No results found.  Assessment and Plan:   Ryan Willis. is a 61 y.o. y/o male here to follow-up for liver cirrhosis, non bleeding esophageal varices secondary to NAFLD.  He is compensated and immune to hepatitis a and B.  No recent history of hepatic encephalopathy.  Meld score is 10 in 05/2021   Plan 1.  Recommend zoster vaccine, Tdap, pneumococcal vaccine 2.  Low-salt diet, avoid NSAID's 3.  Elevated AFP we will proceed with MRI  of the liver 4.  EGD to eradicate any remaining esophageal varices 5.  Lactulose : continue to take at present dose which is working well  6.  Nadolol 20 mg a day continue as his heart rate is 59  bpm-continue at present dose    I have discussed alternative options, risks & benefits,  which include, but are not limited to, bleeding, infection, perforation,respiratory complication & drug reaction.  The patient agrees with this plan & written consent  will be obtained.     Dr [name]  MD,MRCP [facility]) Follow up in 6 months

## 2021-12-23 ENCOUNTER — Ambulatory Visit
Admission: RE | Admit: 2021-12-23 | Discharge: 2021-12-23 | Disposition: A | Discharge status: Home / Self Care | Payer: 59 | Source: Ambulatory Visit | Attending: Gastroenterology | Admitting: Gastroenterology

## 2021-12-23 DIAGNOSIS — D7389 Other diseases of spleen: Secondary | ICD-10-CM | POA: Diagnosis not present

## 2021-12-23 DIAGNOSIS — K766 Portal hypertension: Secondary | ICD-10-CM | POA: Diagnosis not present

## 2021-12-23 DIAGNOSIS — R772 Abnormality of alphafetoprotein: Secondary | ICD-10-CM | POA: Insufficient documentation

## 2021-12-23 DIAGNOSIS — K7460 Unspecified cirrhosis of liver: Secondary | ICD-10-CM | POA: Diagnosis not present

## 2021-12-23 DIAGNOSIS — I8510 Secondary esophageal varices without bleeding: Secondary | ICD-10-CM | POA: Diagnosis not present

## 2021-12-23 MED ORDER — GADOBUTROL 1 MMOL/ML IV SOLN
8.0000 mL | Freq: Once | INTRAVENOUS | Status: AC | PRN
  Administered 2021-12-23: 8 mL via INTRAVENOUS

## 2021-12-27 NOTE — Progress Notes (Signed)
No abnormalities except liver cirrhosis

## 2022-01-01 ENCOUNTER — Telehealth: Payer: Self-pay

## 2022-01-01 ENCOUNTER — Encounter: Payer: Self-pay | Admitting: Gastroenterology

## 2022-01-01 NOTE — Telephone Encounter (Signed)
-----   Message from [name], MD sent at 12/27/2021  9:59 AM EDT ----- No abnormalities except liver cirrhosis

## 2022-01-01 NOTE — Telephone Encounter (Signed)
Called patient but had to leave him a detailed message letting him know that his MRI did not have any abnormalities and if he had any questions to call me back.

## 2022-01-02 ENCOUNTER — Encounter: Payer: Self-pay | Admitting: Gastroenterology

## 2022-01-02 ENCOUNTER — Ambulatory Visit: Payer: 59 | Admitting: Registered Nurse

## 2022-01-02 ENCOUNTER — Encounter
Admission: RE | Disposition: A | Discharge status: Home / Self Care | Payer: Self-pay | Source: Ambulatory Visit | Attending: Gastroenterology

## 2022-01-02 ENCOUNTER — Other Ambulatory Visit: Payer: Self-pay

## 2022-01-02 ENCOUNTER — Ambulatory Visit
Admission: RE | Admit: 2022-01-02 | Discharge: 2022-01-02 | Disposition: A | Discharge status: Home / Self Care | Payer: 59 | Source: Ambulatory Visit | Attending: Gastroenterology | Admitting: Gastroenterology

## 2022-01-02 DIAGNOSIS — K7460 Unspecified cirrhosis of liver: Secondary | ICD-10-CM | POA: Insufficient documentation

## 2022-01-02 DIAGNOSIS — I8500 Esophageal varices without bleeding: Secondary | ICD-10-CM

## 2022-01-02 DIAGNOSIS — I8510 Secondary esophageal varices without bleeding: Secondary | ICD-10-CM | POA: Insufficient documentation

## 2022-01-02 DIAGNOSIS — K3189 Other diseases of stomach and duodenum: Secondary | ICD-10-CM | POA: Diagnosis not present

## 2022-01-02 DIAGNOSIS — K766 Portal hypertension: Secondary | ICD-10-CM | POA: Diagnosis not present

## 2022-01-02 DIAGNOSIS — E039 Hypothyroidism, unspecified: Secondary | ICD-10-CM | POA: Diagnosis not present

## 2022-01-02 HISTORY — PX: ESOPHAGOGASTRODUODENOSCOPY (EGD) WITH PROPOFOL: SHX5813

## 2022-01-02 HISTORY — DX: Esophageal varices without bleeding: I85.00

## 2022-01-02 SURGERY — ESOPHAGOGASTRODUODENOSCOPY (EGD) WITH PROPOFOL
Anesthesia: General

## 2022-01-02 MED ORDER — PROPOFOL 10 MG/ML IV BOLUS
INTRAVENOUS | Status: DC | PRN
  Administered 2022-01-02: 20 mg via INTRAVENOUS
  Administered 2022-01-02: 70 mg via INTRAVENOUS

## 2022-01-02 MED ORDER — SODIUM CHLORIDE 0.9 % IV SOLN: INTRAVENOUS | Status: DC

## 2022-01-02 MED ORDER — PROPOFOL 500 MG/50ML IV EMUL
INTRAVENOUS | Status: DC | PRN
  Administered 2022-01-02: 200 ug/kg/min via INTRAVENOUS

## 2022-01-02 NOTE — Transfer of Care (Signed)
Immediate Anesthesia Transfer of Care Note  Patient: Ryan Willis.  Procedure(s) Performed: ESOPHAGOGASTRODUODENOSCOPY (EGD) WITH PROPOFOL  Patient Location: PACU  Anesthesia Type:General  Level of Consciousness: sedated  Airway & Oxygen Therapy: Patient Spontanous Breathing and Patient connected to nasal cannula oxygen  Post-op Assessment: Report given to RN and Post -op Vital signs reviewed and stable  Post vital signs: Reviewed and stable  Last Vitals:  Vitals Value Taken Time  BP    Temp    Pulse    Resp    SpO2      Last Pain:  Vitals:   01/02/22 1003  TempSrc: Temporal  PainSc: 0-No pain         Complications: No notable events documented.

## 2022-01-02 NOTE — Anesthesia Procedure Notes (Signed)
Date/Time: 01/02/2022 11:08 AM  Performed by: Nelda Marseille, CRNAPre-anesthesia Checklist: Patient identified, Emergency Drugs available, Suction available, Patient being monitored and Timeout performed Oxygen Delivery Method: Nasal cannula

## 2022-01-02 NOTE — Anesthesia Preprocedure Evaluation (Signed)
Anesthesia Evaluation  Patient identified by MRN, date of birth, ID band Patient awake    Reviewed: Allergy & Precautions, H&P , NPO status , Patient's Chart, lab work & pertinent test results, reviewed documented beta blocker date and time   Airway Mallampati: II   Neck ROM: full    Dental  (+) Poor Dentition   Pulmonary neg pulmonary ROS,    Pulmonary exam normal        Cardiovascular negative cardio ROS Normal cardiovascular exam Rhythm:regular Rate:Normal     Neuro/Psych negative neurological ROS  negative psych ROS   GI/Hepatic negative GI ROS, Neg liver ROS,   Endo/Other  Hypothyroidism   Renal/GU Renal disease  negative genitourinary   Musculoskeletal   Abdominal   Peds  Hematology negative hematology ROS (+)   Anesthesia Other Findings Past Medical History: No date: Cirrhosis of liver (HCC) No date: Diverticulitis No date: Family history of adverse reaction to anesthesia     Comment:  MOTHER HAD N/V No date: History of kidney stones No date: Hypercholesteremia No date: Hypothyroidism No date: Peyronie's disease No date: Renal cyst, left No date: Seasonal allergies No date: Thrombocytopenia ([place]) Past Surgical History: No date: CHOLECYSTECTOMY 2006: COLONOSCOPY 09/08/2017: COLONOSCOPY WITH PROPOFOL; N/A     Comment:  Procedure: COLONOSCOPY WITH PROPOFOL;  Surgeon: [name], MD;  Location: [facility] ENDOSCOPY;  Service:               Gastroenterology;  Laterality: N/A; 01/01/2018: COLONOSCOPY WITH PROPOFOL; N/A     Comment:  Procedure: COLONOSCOPY WITH PROPOFOL;  Surgeon: [name], MD;  Location: [facility] ENDOSCOPY;  Service:               Gastroenterology;  Laterality: N/A; 01/17/2019: CYSTOSCOPY/URETEROSCOPY/HOLMIUM LASER/STENT PLACEMENT;  Right     Comment:  Procedure: CYSTOSCOPY/URETEROSCOPY/STENT PLACEMENT;                Surgeon: Hollice Espy, MD;  Location: [facility]  ORS;                Service: Urology;  Laterality: Right; 01/31/2019: CYSTOSCOPY/URETEROSCOPY/HOLMIUM LASER/STENT PLACEMENT;  Right     Comment:  Procedure: KCLEXNTZGY/[id number]/MBWGYKZ LASER/STENT               EXCHANGE;  Surgeon: Hollice Espy, MD;  Location: [facility]               ORS;  Service: Urology;  Laterality: Right; 08/13/2020: CYSTOSCOPY/URETEROSCOPY/HOLMIUM LASER/STENT PLACEMENT; Left     Comment:  Procedure: CYSTOSCOPY/URETEROSCOPY/HOLMIUM LASER/STENT               PLACEMENT;  Surgeon: Hollice Espy, MD;  Location: [facility]              ORS;  Service: Urology;  Laterality: Left; 09/08/2017: ESOPHAGOGASTRODUODENOSCOPY (EGD) WITH PROPOFOL; N/A     Comment:  Procedure: ESOPHAGOGASTRODUODENOSCOPY (EGD) WITH               PROPOFOL;  Surgeon: [name], MD;  Location: [facility]               ENDOSCOPY;  Service: Gastroenterology;  Laterality: N/A; 05/31/2020: ESOPHAGOGASTRODUODENOSCOPY (EGD) WITH PROPOFOL; N/A     Comment:  Procedure: ESOPHAGOGASTRODUODENOSCOPY (EGD) WITH               PROPOFOL;  Surgeon: [name], MD;  Location: [facility]  ENDOSCOPY;  Service: Gastroenterology;  Laterality: N/A; 07/05/2021: ESOPHAGOGASTRODUODENOSCOPY (EGD) WITH PROPOFOL; N/A     Comment:  Procedure: ESOPHAGOGASTRODUODENOSCOPY (EGD) WITH               PROPOFOL;  Surgeon: [name], MD;  Location: [facility]               ENDOSCOPY;  Service: Gastroenterology;  Laterality: N/A;               WIFE WILL BE DRIVER: WILL BE 20 - 43 MINUTES AWAY 1981: HERNIA REPAIR; Right     Comment:  inguinal 04/19/2019: INTRAOPERATIVE CHOLANGIOGRAM; N/A     Comment:  Procedure: INTRAOPERATIVE CHOLANGIOGRAM;  Surgeon:               Jules Husbands, MD;  Location: [facility] ORS;  Service:               General;  Laterality: N/A; 2001: SHOULDER SURGERY; Right     Comment:  ROTATOR CUFF REPAIR 10/27/2019: VENTRAL HERNIA REPAIR; N/A     Comment:  Procedure: HERNIA REPAIR VENTRAL ADULT, Open;  Surgeon:               Jules Husbands, MD;  Location: [facility] ORS;  Service:               General;  Laterality: N/A; BMI    Body Mass Index: 29.05 kg/m     Reproductive/Obstetrics negative OB ROS                             Anesthesia Physical  Anesthesia Plan  ASA: 3  Anesthesia Plan: General   Post-op Pain Management:    Induction: Intravenous  PONV Risk Score and Plan: 2 and Propofol infusion and TIVA  Airway Management Planned: Natural Airway and Nasal Cannula  Additional Equipment:   Intra-op Plan:   Post-operative Plan:   Informed Consent: I have reviewed the patients History and Physical, chart, labs and discussed the procedure including the risks, benefits and alternatives for the proposed anesthesia with the patient or authorized representative who has indicated his/her understanding and acceptance.     Dental Advisory Given  Plan Discussed with: CRNA  Anesthesia Plan Comments:         Anesthesia Quick Evaluation

## 2022-01-02 NOTE — H&P (Signed)
[name], MD [address] [place], [place], [place], [address] [address], [place], [place], [place], [address] Phone: [phone]  Fax: [phone]  Primary Care Physician:  Patient, No Pcp Per   Pre-Procedure History & Physical: HPI:  Ryan Willis. is a 61 y.o. male is here for an endoscopy    Past Medical History:  Diagnosis Date   Cirrhosis of liver ([place])    Diverticulitis    Esophageal varices ([place])    Family history of adverse reaction to anesthesia    MOTHER HAD N/V   History of kidney stones    Hypercholesteremia    Hypothyroidism    Peyronie's disease    Renal cyst, left    Seasonal allergies    Thrombocytopenia ([place])     Past Surgical History:  Procedure Laterality Date   CHOLECYSTECTOMY     COLONOSCOPY  2006   COLONOSCOPY WITH PROPOFOL N/A 09/08/2017   Procedure: COLONOSCOPY WITH PROPOFOL;  Surgeon: [name], MD;  Location: [facility] ENDOSCOPY;  Service: Gastroenterology;  Laterality: N/A;   COLONOSCOPY WITH PROPOFOL N/A 01/01/2018   Procedure: COLONOSCOPY WITH PROPOFOL;  Surgeon: [name], MD;  Location: [facility] ENDOSCOPY;  Service: Gastroenterology;  Laterality: N/A;   CYSTOSCOPY/URETEROSCOPY/HOLMIUM LASER/STENT PLACEMENT Right 01/17/2019   Procedure: CYSTOSCOPY/URETEROSCOPY/STENT PLACEMENT;  Surgeon: Hollice Espy, MD;  Location: [facility] ORS;  Service: Urology;  Laterality: Right;   CYSTOSCOPY/URETEROSCOPY/HOLMIUM LASER/STENT PLACEMENT Right 01/31/2019   Procedure: CYSTOSCOPY/URETEROSCOPY/HOLMIUM LASER/STENT EXCHANGE;  Surgeon: Hollice Espy, MD;  Location: [facility] ORS;  Service: Urology;  Laterality: Right;   CYSTOSCOPY/URETEROSCOPY/HOLMIUM LASER/STENT PLACEMENT Left 08/13/2020   Procedure: CYSTOSCOPY/URETEROSCOPY/HOLMIUM LASER/STENT PLACEMENT;  Surgeon: Hollice Espy, MD;  Location: [facility] ORS;  Service: Urology;  Laterality: Left;   ESOPHAGOGASTRODUODENOSCOPY (EGD) WITH PROPOFOL N/A 09/08/2017   Procedure: ESOPHAGOGASTRODUODENOSCOPY (EGD) WITH  PROPOFOL;  Surgeon: [name], MD;  Location: [facility] ENDOSCOPY;  Service: Gastroenterology;  Laterality: N/A;   ESOPHAGOGASTRODUODENOSCOPY (EGD) WITH PROPOFOL N/A 05/31/2020   Procedure: ESOPHAGOGASTRODUODENOSCOPY (EGD) WITH PROPOFOL;  Surgeon: [name], MD;  Location: [facility] ENDOSCOPY;  Service: Gastroenterology;  Laterality: N/A;   ESOPHAGOGASTRODUODENOSCOPY (EGD) WITH PROPOFOL N/A 07/05/2021   Procedure: ESOPHAGOGASTRODUODENOSCOPY (EGD) WITH PROPOFOL;  Surgeon: [name], MD;  Location: [facility] ENDOSCOPY;  Service: Gastroenterology;  Laterality: N/A;  WIFE WILL BE DRIVER: WILL BE 20 - 30 MINUTES AWAY   ESOPHAGOGASTRODUODENOSCOPY (EGD) WITH PROPOFOL N/A 08/08/2021   Procedure: ESOPHAGOGASTRODUODENOSCOPY (EGD) WITH PROPOFOL;  Surgeon: [name], MD;  Location: [facility] ENDOSCOPY;  Service: Gastroenterology;  Laterality: N/A;   HERNIA REPAIR Right 1981   inguinal   INTRAOPERATIVE CHOLANGIOGRAM N/A 04/19/2019   Procedure: INTRAOPERATIVE CHOLANGIOGRAM;  Surgeon: Jules Husbands, MD;  Location: [facility] ORS;  Service: General;  Laterality: N/A;   SHOULDER SURGERY Right 2001   ROTATOR CUFF REPAIR   VENTRAL HERNIA REPAIR N/A 10/27/2019   Procedure: HERNIA REPAIR VENTRAL ADULT, Open;  Surgeon: Jules Husbands, MD;  Location: [facility] ORS;  Service: General;  Laterality: N/A;    Prior to Admission medications   Medication Sig Start Date End Date Taking? Authorizing Provider  lactulose (CHRONULAC) 10 GM/15ML solution Take by mouth 3 (three) times daily as needed for mild constipation.   Yes [provider]  Multiple Vitamin (MULTIVITAMIN WITH MINERALS) TABS tablet Take 1 tablet by mouth daily.   Yes [provider]  nadolol (CORGARD) 20 MG tablet TAKE 1 TABLET(20 MG) BY MOUTH DAILY 05/23/21  Yes [name], MD    Allergies as of 12/16/2021   (No Known Allergies)  Family History  Problem Relation Age of Onset   Kidney cancer Mother    Lung cancer Mother    Colon cancer Father     Social  History   Socioeconomic History   Marital status: Married    Spouse name: Not on file   Number of children: Not on file   Years of education: Not on file   Highest education level: Not on file  Occupational History   Not on file  Tobacco Use   Smoking status: Never   Smokeless tobacco: Never  Vaping Use   Vaping Use: Never used  Substance and Sexual Activity   Alcohol use: Not Currently   Drug use: No   Sexual activity: Not on file  Other Topics Concern   Not on file  Social History Narrative   retd. Engineer, structural; part time information specialist; never smoked; no alcohol; live in [place]. Lives with wife.    Social Determinants of Health   Financial Resource Strain: Not on file  Food Insecurity: Not on file  Transportation Needs: Not on file  Physical Activity: Not on file  Stress: Not on file  Social Connections: Not on file  Intimate Partner Violence: Not on file    Review of Systems: See HPI, otherwise negative ROS  Physical Exam: BP 132/68   Pulse (!) 50   Temp (!) 97 F (36.1 C) (Temporal)   Resp 18   Ht 5' 6"  (1.676 m)   Wt 83.9 kg   SpO2 97%   BMI 29.86 kg/m  General:   Alert,  pleasant and cooperative in NAD Head:  Normocephalic and atraumatic. Neck:  Supple; no masses or thyromegaly. Lungs:  Clear throughout to auscultation, normal respiratory effort.    Heart:  +S1, +S2, Regular rate and rhythm, No edema. Abdomen:  Soft, nontender and nondistended. Normal bowel sounds, without guarding, and without rebound.   Neurologic:  Alert and  oriented x4;  grossly normal neurologically.  Impression/Plan: Ryan Willis. is here for an endoscopy  to be performed for  evaluation of esophageal varices    Risks, benefits, limitations, and alternatives regarding endoscopy have been reviewed with the patient.  Questions have been answered.  All parties agreeable.   [name], MD  01/02/2022, 10:53 AM

## 2022-01-02 NOTE — Op Note (Signed)
[facility] Gastroenterology Patient Name: Ryan Willis Procedure Date: 01/02/2022 10:53 AM MRN: [id number] Account #: [id number] Date of Birth: Feb 25, 1961 Admit Type: Outpatient Age: 61 Room: [facility] ENDO ROOM 3 Gender: Male Note Status: Finalized Instrument Name: Upper Endoscope [id number] Procedure:             Upper GI endoscopy Indications:           Follow-up of esophageal varices Providers:             [name] MD, MD Referring MD:          No Local Md, MD (Referring MD) Medicines:             Monitored Anesthesia Care Complications:         No immediate complications. Procedure:             Pre-Anesthesia Assessment:                        - Prior to the procedure, a History and Physical was                         performed, and patient medications, allergies and                         sensitivities were reviewed. The patient's tolerance                         of previous anesthesia was reviewed.                        - The risks and benefits of the procedure and the                         sedation options and risks were discussed with the                         patient. All questions were answered and informed                         consent was obtained.                        - ASA Grade Assessment: III - A patient with severe                         systemic disease.                        After obtaining informed consent, the endoscope was                         passed under direct vision. Throughout the procedure,                         the patient's blood pressure, pulse, and oxygen                         saturations were monitored continuously. The  Endosonoscope was introduced through the mouth, and                         advanced to the third part of duodenum. The upper GI                         endoscopy was accomplished with ease. The patient                         tolerated the procedure well. Findings:      The  examined duodenum was normal.      Portal hypertensive gastropathy was found in the entire examined stomach.      Grade III varices were found in the lower third of the esophagus. They       were large in size. Three bands were successfully placed with incomplete       eradication of varices. There was no bleeding during and at the end of       the procedure.      The cardia and gastric fundus were normal on retroflexion. Impression:            - Normal examined duodenum.                        - Portal hypertensive gastropathy.                        - Grade III esophageal varices. Incompletely                         eradicated. Banded.                        - No specimens collected. Recommendation:        - Discharge patient to home (with escort).                        - Resume previous diet.                        - Continue present medications.                        - Repeat upper endoscopy in 4 weeks to evaluate the                         response to therapy. Procedure Code(s):     --- Professional ---                        [phone], Esophagogastroduodenoscopy, flexible,                         transoral; with band ligation of esophageal/gastric                         varices Diagnosis Code(s):     --- Professional ---                        K76.6, Portal hypertension  K31.89, Other diseases of stomach and duodenum                        I85.00, Esophageal varices without bleeding CPT copyright 2019 American Medical Association. All rights reserved. The codes documented in this report are preliminary and upon coder review may  be revised to meet current compliance requirements. [name], MD [name] MD, MD 01/02/2022 11:10:02 AM This report has been signed electronically. Number of Addenda: 0 Note Initiated On: 01/02/2022 10:53 AM Estimated Blood Loss:  Estimated blood loss: none.      [facility]

## 2022-01-03 ENCOUNTER — Encounter: Payer: Self-pay | Admitting: Gastroenterology

## 2022-01-07 NOTE — Anesthesia Postprocedure Evaluation (Signed)
Anesthesia Post Note  Patient: Ryan Willis.  Procedure(s) Performed: ESOPHAGOGASTRODUODENOSCOPY (EGD) WITH PROPOFOL  Patient location during evaluation: Endoscopy Anesthesia Type: General Level of consciousness: awake and alert Pain management: pain level controlled Vital Signs Assessment: post-procedure vital signs reviewed and stable Respiratory status: spontaneous breathing, nonlabored ventilation, respiratory function stable and patient connected to nasal cannula oxygen Cardiovascular status: blood pressure returned to baseline and stable Postop Assessment: no apparent nausea or vomiting Anesthetic complications: no   No notable events documented.   Last Vitals:  Vitals:   01/02/22 1123 01/02/22 1133  BP: 125/64 130/65  Pulse: (!) 54 (!) 51  Resp: 11 14  Temp:    SpO2: 95% 99%    Last Pain:  Vitals:   01/03/22 0725  TempSrc:   PainSc: 0-No pain                 Martha Clan

## 2022-03-14 ENCOUNTER — Encounter: Payer: Self-pay | Admitting: Internal Medicine

## 2022-03-28 ENCOUNTER — Other Ambulatory Visit: Payer: Self-pay | Admitting: *Deleted

## 2022-03-28 DIAGNOSIS — D696 Thrombocytopenia, unspecified: Secondary | ICD-10-CM

## 2022-03-31 ENCOUNTER — Inpatient Hospital Stay: Payer: 59 | Attending: Internal Medicine | Admitting: Internal Medicine

## 2022-03-31 ENCOUNTER — Encounter: Payer: Self-pay | Admitting: Internal Medicine

## 2022-03-31 VITALS — BP 143/76 | HR 56 | Temp 97.6°F | Resp 16 | Wt 189.6 lb

## 2022-03-31 DIAGNOSIS — D696 Thrombocytopenia, unspecified: Secondary | ICD-10-CM | POA: Insufficient documentation

## 2022-03-31 DIAGNOSIS — R161 Splenomegaly, not elsewhere classified: Secondary | ICD-10-CM | POA: Diagnosis not present

## 2022-03-31 DIAGNOSIS — K7460 Unspecified cirrhosis of liver: Secondary | ICD-10-CM | POA: Insufficient documentation

## 2022-03-31 NOTE — Assessment & Plan Note (Addendum)
#  Leucopenia/ Thrombocytopenia/splenomegaly-Hypersplenism- [January 2019 platelets- between 106-97]-NO 2023-WBC- 2.3/ANC1.5;Hb -13 platelets 52 Trending down but overall clinically STABLE; Asymptomatic.  Continue surveillance.  # Cirrhosis/splenomegaly [May 4628]  [Dr.Anna] [n-8.3]; intermittently elevated; monitor for now; EGD- SEP 2023- s/p banding. MRI AUG 2023- No enhancing lesion in the liver. No evidence of hepatocellular carcinoma. Portal hypertension with paraesophageal varices and splenomegaly. No ascites- STABLE.   # DISPOSITION:  # follow up in in 6 months MD, labs -cbc/cmp/AFP/PT/PTT [[facility]]; - Dr.B

## 2022-03-31 NOTE — Progress Notes (Signed)
[facility] CONSULT NOTE  Patient Care Team: Patient, No Pcp Per as PCP - General ([place]) Cammie Sickle, MD as Consulting Physician (Oncology)  CHIEF COMPLAINTS/PURPOSE OF CONSULTATION: Thrombocytopenia  # THROMBOCYTOPENIA [97-108; incidental]; Hep B/hepC/HIV-NEG [[facility]] sec to cirrhosis/ splenomegaly  # Cirrhosis/ SPLENOMEGALY[US- 1000cc; CT A/P march 2019] s/p EGD/colo [may 2019- Dr.Anna]  #October 2020-kidney stones s/p stenting [Dr. Erlene Quan; gallstone/cholecystitis-s/p cholecystectomy December 2020]  # Peyronie disease-   Oncology History   No history exists.   HISTORY OF PRESENTING ILLNESS: Ambulating independently.  Alone.  Sharlot Gowda. 61 y.o.  male cirrhosis/splenomegaly and thrombocytopenia is here for follow-up/ review of labs [[facility]]  Patient s/p EGD in sep 2023.  No swelling of the legs.  Patient overall feels good.  Denies any blood in stools or black-colored stools.  No gum bleeding no nosebleeds.  Review of Systems  Constitutional:  Positive for weight loss. Negative for chills, diaphoresis, fever and malaise/fatigue.  HENT:  Negative for nosebleeds and sore throat.   Eyes:  Negative for double vision.  Respiratory:  Negative for cough, hemoptysis, sputum production, shortness of breath and wheezing.   Cardiovascular:  Negative for chest pain, palpitations, orthopnea and leg swelling.  Gastrointestinal:  Negative for abdominal pain, blood in stool, constipation, diarrhea, heartburn, melena, nausea and vomiting.  Genitourinary:  Negative for dysuria, frequency and urgency.  Musculoskeletal:  Negative for back pain and joint pain.  Skin: Negative.  Negative for itching and rash.  Neurological:  Negative for dizziness, tingling, focal weakness, weakness and headaches.  Endo/Heme/Allergies:  Bruises/bleeds easily.  Psychiatric/Behavioral:  Negative for depression. The patient is not nervous/anxious and does not have insomnia.      MEDICAL HISTORY:  Past Medical History:  Diagnosis Date   Cirrhosis of liver ([place])    Diverticulitis    Esophageal varices ([place])    Family history of adverse reaction to anesthesia    MOTHER HAD N/V   History of kidney stones    Hypercholesteremia    Hypothyroidism    Peyronie's disease    Renal cyst, left    Seasonal allergies    Thrombocytopenia ([place])     SURGICAL HISTORY: Past Surgical History:  Procedure Laterality Date   CHOLECYSTECTOMY     COLONOSCOPY  2006   COLONOSCOPY WITH PROPOFOL N/A 09/08/2017   Procedure: COLONOSCOPY WITH PROPOFOL;  Surgeon: [name], MD;  Location: [facility] ENDOSCOPY;  Service: Gastroenterology;  Laterality: N/A;   COLONOSCOPY WITH PROPOFOL N/A 01/01/2018   Procedure: COLONOSCOPY WITH PROPOFOL;  Surgeon: [name], MD;  Location: [facility] ENDOSCOPY;  Service: Gastroenterology;  Laterality: N/A;   CYSTOSCOPY/URETEROSCOPY/HOLMIUM LASER/STENT PLACEMENT Right 01/17/2019   Procedure: CYSTOSCOPY/URETEROSCOPY/STENT PLACEMENT;  Surgeon: Hollice Espy, MD;  Location: [facility] ORS;  Service: Urology;  Laterality: Right;   CYSTOSCOPY/URETEROSCOPY/HOLMIUM LASER/STENT PLACEMENT Right 01/31/2019   Procedure: CYSTOSCOPY/URETEROSCOPY/HOLMIUM LASER/STENT EXCHANGE;  Surgeon: Hollice Espy, MD;  Location: [facility] ORS;  Service: Urology;  Laterality: Right;   CYSTOSCOPY/URETEROSCOPY/HOLMIUM LASER/STENT PLACEMENT Left 08/13/2020   Procedure: CYSTOSCOPY/URETEROSCOPY/HOLMIUM LASER/STENT PLACEMENT;  Surgeon: Hollice Espy, MD;  Location: [facility] ORS;  Service: Urology;  Laterality: Left;   ESOPHAGOGASTRODUODENOSCOPY (EGD) WITH PROPOFOL N/A 09/08/2017   Procedure: ESOPHAGOGASTRODUODENOSCOPY (EGD) WITH PROPOFOL;  Surgeon: [name], MD;  Location: [facility] ENDOSCOPY;  Service: Gastroenterology;  Laterality: N/A;   ESOPHAGOGASTRODUODENOSCOPY (EGD) WITH PROPOFOL N/A 05/31/2020   Procedure: ESOPHAGOGASTRODUODENOSCOPY (EGD) WITH PROPOFOL;  Surgeon: [name], MD;  Location: [facility] ENDOSCOPY;   Service: Gastroenterology;  Laterality: N/A;   ESOPHAGOGASTRODUODENOSCOPY (EGD) WITH PROPOFOL N/A 07/05/2021  Procedure: ESOPHAGOGASTRODUODENOSCOPY (EGD) WITH PROPOFOL;  Surgeon: [name], MD;  Location: [facility] ENDOSCOPY;  Service: Gastroenterology;  Laterality: N/A;  WIFE WILL BE DRIVER: WILL BE 20 - 30 MINUTES AWAY   ESOPHAGOGASTRODUODENOSCOPY (EGD) WITH PROPOFOL N/A 08/08/2021   Procedure: ESOPHAGOGASTRODUODENOSCOPY (EGD) WITH PROPOFOL;  Surgeon: [name], MD;  Location: [facility] ENDOSCOPY;  Service: Gastroenterology;  Laterality: N/A;   ESOPHAGOGASTRODUODENOSCOPY (EGD) WITH PROPOFOL N/A 01/02/2022   Procedure: ESOPHAGOGASTRODUODENOSCOPY (EGD) WITH PROPOFOL;  Surgeon: [name], MD;  Location: [facility] ENDOSCOPY;  Service: Gastroenterology;  Laterality: N/A;   HERNIA REPAIR Right 1981   inguinal   INTRAOPERATIVE CHOLANGIOGRAM N/A 04/19/2019   Procedure: INTRAOPERATIVE CHOLANGIOGRAM;  Surgeon: Jules Husbands, MD;  Location: [facility] ORS;  Service: General;  Laterality: N/A;   SHOULDER SURGERY Right 2001   ROTATOR CUFF REPAIR   VENTRAL HERNIA REPAIR N/A 10/27/2019   Procedure: HERNIA REPAIR VENTRAL ADULT, Open;  Surgeon: Jules Husbands, MD;  Location: [facility] ORS;  Service: General;  Laterality: N/A;    SOCIAL HISTORY:  Social History   Socioeconomic History   Marital status: Married    Spouse name: Not on file   Number of children: Not on file   Years of education: Not on file   Highest education level: Not on file  Occupational History   Not on file  Tobacco Use   Smoking status: Never   Smokeless tobacco: Never  Vaping Use   Vaping Use: Never used  Substance and Sexual Activity   Alcohol use: Not Currently   Drug use: No   Sexual activity: Not on file  Other Topics Concern   Not on file  Social History Narrative   retd. Engineer, structural; part time information specialist; never smoked; no alcohol; live in [place]. Lives with wife.    Social Determinants of Health   Financial Resource  Strain: Not on file  Food Insecurity: Not on file  Transportation Needs: Not on file  Physical Activity: Not on file  Stress: Not on file  Social Connections: Not on file  Intimate Partner Violence: Not on file    FAMILY HISTORY: colon father- 11 year; mother- kidney cancer; died of lung cancer [2018] Family History  Problem Relation Age of Onset   Kidney cancer Mother    Lung cancer Mother    Colon cancer Father     ALLERGIES:  has No Known Allergies.  MEDICATIONS:  Current Outpatient Medications  Medication Sig Dispense Refill   lactulose (CHRONULAC) 10 GM/15ML solution Take by mouth 3 (three) times daily as needed for mild constipation.     Multiple Vitamin (MULTIVITAMIN WITH MINERALS) TABS tablet Take 1 tablet by mouth daily.     nadolol (CORGARD) 20 MG tablet TAKE 1 TABLET(20 MG) BY MOUTH DAILY 90 tablet 3   No current facility-administered medications for this visit.   PHYSICAL EXAMINATION: ECOG PERFORMANCE STATUS: 0 - Asymptomatic  Vitals:   03/31/22 1300  BP: (!) 143/76  Pulse: (!) 56  Resp: 16  Temp: 97.6 F (36.4 C)   Filed Weights   03/31/22 1300  Weight: 189 lb 9.6 oz (86 kg)   Physical Exam HENT:     Head: Normocephalic and atraumatic.     Mouth/Throat:     Pharynx: No oropharyngeal exudate.  Eyes:     Pupils: Pupils are equal, round, and reactive to light.  Cardiovascular:     Rate and Rhythm: Normal rate and regular rhythm.  Pulmonary:     Effort: No respiratory distress.  Breath sounds: No wheezing.  Abdominal:     General: Bowel sounds are normal. There is no distension.     Palpations: Abdomen is soft. There is no mass.     Tenderness: There is no abdominal tenderness. There is no guarding or rebound.  Musculoskeletal:        General: No tenderness. Normal range of motion.     Cervical back: Normal range of motion and neck supple.  Skin:    General: Skin is warm.  Neurological:     Mental Status: He is alert and oriented to person,  place, and time.  Psychiatric:        Mood and Affect: Affect normal.      LABORATORY DATA:  I have reviewed the data as listed Lab Results  Component Value Date   WBC 2.6 (L) 08/24/2020   HGB 13.1 08/24/2020   HCT 36.4 (L) 08/24/2020   MCV 96 08/24/2020   PLT 64 (LL) 08/24/2020   No results for input(s): "NA", "K", "CL", "CO2", "GLUCOSE", "BUN", "CREATININE", "CALCIUM", "GFRNONAA", "GFRAA", "PROT", "ALBUMIN", "AST", "ALT", "ALKPHOS", "BILITOT", "BILIDIR", "IBILI" in the last 8760 hours.   RADIOGRAPHIC STUDIES: I have personally reviewed the radiological images as listed and agreed with the findings in the report. No results found.  ASSESSMENT & PLAN:   Thrombocytopenia ([place]) #Leucopenia/ Thrombocytopenia/splenomegaly-Hypersplenism- [January 2019 platelets- between 106-97]-MAY 2023-WBC- 2.3/ANC1.5;Hb -13 platelets 52 Trending down but overall clinically    STABLE; Asymptomatic.  Continue surveillance.  # Cirrhosis/splenomegaly-MRI in May 2020; Oct 2021 CT A/P [Dr.Anna] GI-AFP-10 [n-8.3]; monitor for now; MRI JAN 2023- No enhancing lesion in the liver. No evidence of hepatocellular carcinoma. Portal hypertension with paraesophageal varices and splenomegaly. No ascites; June 2023 US-Pending [Dr.Anna].    # DISPOSITION:  # follow up in 1st week of dec; MD, labs -cbc/cmp/AFP/PT/PTT [[facility]]; - Dr.B  All questions were answered. The patient knows to call the clinic with any problems, questions or concerns.    Cammie Sickle, MD 03/31/2022 2:18 PM

## 2022-03-31 NOTE — Progress Notes (Signed)
Patient denies new problems/concerns today.   °

## 2022-05-23 ENCOUNTER — Other Ambulatory Visit: Payer: Self-pay

## 2022-05-23 MED ORDER — NADOLOL 20 MG PO TABS
ORAL_TABLET | ORAL | 3 refills | Status: DC
Start: 2022-05-23 — End: 2023-05-18

## 2022-05-30 ENCOUNTER — Ambulatory Visit: Payer: Self-pay

## 2022-05-30 DIAGNOSIS — Z0000 Encounter for general adult medical examination without abnormal findings: Secondary | ICD-10-CM

## 2022-05-30 LAB — POCT URINALYSIS DIPSTICK
Bilirubin, UA: NEGATIVE
Blood, UA: NEGATIVE
Glucose, UA: NEGATIVE
Ketones, UA: NEGATIVE
Leukocytes, UA: NEGATIVE
Nitrite, UA: NEGATIVE
Protein, UA: NEGATIVE
Spec Grav, UA: 1.025 (ref 1.010–1.025)
Urobilinogen, UA: 0.2 E.U./dL
pH, UA: 6 (ref 5.0–8.0)

## 2022-05-30 NOTE — Progress Notes (Signed)
Pt presents today to complete lab portion of physical. /CL,RMA 

## 2022-06-02 LAB — CMP12+LP+TP+TSH+6AC+PSA+CBC…
ALT: 20 IU/L (ref 0–44)
AST: 28 IU/L (ref 0–40)
Albumin/Globulin Ratio: 1.5 (ref 1.2–2.2)
Albumin: 4 g/dL (ref 3.9–4.9)
Alkaline Phosphatase: 81 IU/L (ref 44–121)
BUN/Creatinine Ratio: 16 (ref 10–24)
BUN: 15 mg/dL (ref 8–27)
Basophils Absolute: 0 10*3/uL (ref 0.0–0.2)
Basos: 1 %
Bilirubin Total: 1.8 mg/dL — ABNORMAL HIGH (ref 0.0–1.2)
Calcium: 9.6 mg/dL (ref 8.6–10.2)
Chloride: 111 mmol/L — ABNORMAL HIGH (ref 96–106)
Chol/HDL Ratio: 2.6 ratio (ref 0.0–5.0)
Cholesterol, Total: 117 mg/dL (ref 100–199)
Creatinine, Ser: 0.95 mg/dL (ref 0.76–1.27)
EOS (ABSOLUTE): 0.1 10*3/uL (ref 0.0–0.4)
Eos: 4 %
Estimated CHD Risk: <0.5 times avg. (ref 0.0–1.0)
Free Thyroxine Index: 2 (ref 1.2–4.9)
GGT: 25 IU/L (ref 0–65)
Globulin, Total: 2.6 g/dL (ref 1.5–4.5)
Glucose: 98 mg/dL (ref 70–99)
HDL: 45 mg/dL (ref >39)
Hematocrit: 38.7 % (ref 37.5–51.0)
Hemoglobin: 13.3 g/dL (ref 13.0–17.7)
Immature Grans (Abs): 0 10*3/uL (ref 0.0–0.1)
Immature Granulocytes: 0 %
Iron: 147 ug/dL (ref 38–169)
LDH: 155 IU/L (ref 121–224)
LDL Chol Calc (NIH): 57 mg/dL (ref 0–99)
Lymphocytes Absolute: 0.6 10*3/uL — ABNORMAL LOW (ref 0.7–3.1)
Lymphs: 28 %
MCH: 32.6 pg (ref 26.6–33.0)
MCHC: 34.4 g/dL (ref 31.5–35.7)
MCV: 95 fL (ref 79–97)
Monocytes Absolute: 0.2 10*3/uL (ref 0.1–0.9)
Monocytes: 10 %
Neutrophils Absolute: 1.3 10*3/uL — ABNORMAL LOW (ref 1.4–7.0)
Neutrophils: 57 %
Phosphorus: 3.1 mg/dL (ref 2.8–4.1)
Platelets: 47 10*3/uL — CL (ref 150–450)
Potassium: 5.4 mmol/L — ABNORMAL HIGH (ref 3.5–5.2)
Prostate Specific Ag, Serum: 1.5 ng/mL (ref 0.0–4.0)
RBC: 4.08 x10E6/uL — ABNORMAL LOW (ref 4.14–5.80)
RDW: 13.1 % (ref 11.6–15.4)
Sodium: 147 mmol/L — ABNORMAL HIGH (ref 134–144)
T3 Uptake Ratio: 26 % (ref 24–39)
T4, Total: 7.7 ug/dL (ref 4.5–12.0)
TSH: 2.92 u[IU]/mL (ref 0.450–4.500)
Total Protein: 6.6 g/dL (ref 6.0–8.5)
Triglycerides: 73 mg/dL (ref 0–149)
Uric Acid: 5.2 mg/dL (ref 3.8–8.4)
VLDL Cholesterol Cal: 15 mg/dL (ref 5–40)
WBC: 2.3 10*3/uL — CL (ref 3.4–10.8)
eGFR: 90 mL/min/{1.73_m2} (ref >59)

## 2022-06-05 ENCOUNTER — Encounter: Payer: Self-pay | Admitting: Physician Assistant

## 2022-06-05 ENCOUNTER — Ambulatory Visit: Payer: Self-pay | Admitting: Physician Assistant

## 2022-06-05 VITALS — BP 122/56 | HR 54 | Temp 97.7°F | Resp 12 | Ht 66.0 in | Wt 190.0 lb

## 2022-06-05 DIAGNOSIS — D696 Thrombocytopenia, unspecified: Secondary | ICD-10-CM

## 2022-06-05 DIAGNOSIS — K7030 Alcoholic cirrhosis of liver without ascites: Secondary | ICD-10-CM

## 2022-06-05 DIAGNOSIS — I8500 Esophageal varices without bleeding: Secondary | ICD-10-CM

## 2022-06-05 DIAGNOSIS — Z0000 Encounter for general adult medical examination without abnormal findings: Secondary | ICD-10-CM

## 2022-06-05 DIAGNOSIS — Z23 Encounter for immunization: Secondary | ICD-10-CM

## 2022-06-05 NOTE — Progress Notes (Signed)
[facility] ____________________________________________   None    (approximate)  I have reviewed the triage vital signs and the nursing notes.   HISTORY  Chief Complaint Annual Exam   HPI Ryan [facility]. is a 62 y.o. male patient presents for annual physical exam.  Patient voiced no concerns or complaints.  Patient is followed by oncology for thrombocytopenia and by gastroenterologist for esophageal varices and liver cirrhosis.         Past Medical History:  Diagnosis Date   Cirrhosis of liver ([facility])    Diverticulitis    Esophageal varices ([facility])    Family history of adverse reaction to anesthesia    MOTHER HAD N/V   History of kidney stones    Hypercholesteremia    Hypothyroidism    Peyronie's disease    Renal cyst, left    Seasonal allergies    Thrombocytopenia [facility])     Patient Active Problem List   Diagnosis Date Noted   Esophageal varices determined by endoscopy ([facility])    S/P cholecystectomy 04/26/2019   Cholecystitis 04/19/2019   Acute cholecystitis 01/25/2019   Splenomegaly 07/07/2017   Thrombocytopenia ([facility]) 06/23/2017   Cirrhosis ([facility]) 06/23/2017    Past Surgical History:  Procedure Laterality Date   CHOLECYSTECTOMY     COLONOSCOPY  2006   COLONOSCOPY WITH PROPOFOL N/A 09/08/2017   Procedure: COLONOSCOPY WITH PROPOFOL;  Surgeon: Ryan Bellows, MD;  Location: [facility] ENDOSCOPY;  Service: Gastroenterology;  Laterality: N/A;   COLONOSCOPY WITH PROPOFOL N/A 01/01/2018   Procedure: COLONOSCOPY WITH PROPOFOL;  Surgeon: Ryan Bellows, MD;  [facility];  Service: Gastroenterology;  Laterality: N/A;   CYSTOSCOPY/URETEROSCOPY/HOLMIUM LASER/STENT PLACEMENT Right 01/17/2019   Procedure: CYSTOSCOPY/URETEROSCOPY/STENT PLACEMENT;  Surgeon: Hollice Espy, MD;  Location: [facility] ORS;  Service: Urology;  Laterality: Right;   CYSTOSCOPY/URETEROSCOPY/HOLMIUM LASER/STENT PLACEMENT Right 01/31/2019   Procedure:  CYSTOSCOPY/URETEROSCOPY/HOLMIUM LASER/STENT EXCHANGE;  Surgeon: Hollice Espy, MD;  Location: [facility] ORS;  Service: Urology;  Laterality: Right;   CYSTOSCOPY/URETEROSCOPY/HOLMIUM LASER/STENT PLACEMENT Left 08/13/2020   Procedure: CYSTOSCOPY/URETEROSCOPY/HOLMIUM LASER/STENT PLACEMENT;  Surgeon: Hollice Espy, MD;  Location: [facility] ORS;  Service: Urology;  Laterality: Left;   ESOPHAGOGASTRODUODENOSCOPY (EGD) WITH PROPOFOL N/A 09/08/2017   Procedure: ESOPHAGOGASTRODUODENOSCOPY (EGD) WITH PROPOFOL;  Surgeon: Ryan Bellows, MD;  Location[facility] ENDOSCOPY;  Service: Gastroenterology;  Laterality: N/A;   ESOPHAGOGASTRODUODENOSCOPY (EGD) WITH PROPOFOL N/A 05/31/2020   Procedure: ESOPHAGOGASTRODUODENOSCOPY (EGD) WITH PROPOFOL;  Surgeon: Ryan Bellows, MD;  Location: [facility] ENDOSCOPY;  Service: Gastroenterology;  Laterality: N/A;   ESOPHAGOGASTRODUODENOSCOPY (EGD) WITH PROPOFOL N/A 07/05/2021   Procedure: ESOPHAGOGASTRODUODENOSCOPY (EGD) WITH PROPOFOL;  Surgeon: Ryan Bellows, MD;  Location: [facility];  Service: Gastroenterology;  Laterality: N/A;  WIFE WILL BE DRIVER: WILL BE 20 - 30 MINUTES AWAY   ESOPHAGOGASTRODUODENOSCOPY (EGD) WITH PROPOFOL N/A 08/08/2021   Procedure: ESOPHAGOGASTRODUODENOSCOPY (EGD) WITH PROPOFOL;  Surgeon: Ryan Bellows, MD;  [facility]: [facility];  Service: Gastroenterology;  Laterality: N/A;   ESOPHAGOGASTRODUODENOSCOPY (EGD) WITH PROPOFOL N/A 01/02/2022   Procedure: ESOPHAGOGASTRODUODENOSCOPY (EGD) WITH PROPOFOL;  Surgeon: Ryan Bellows, MD;  Location: [facility];  Service: Gastroenterology;  Laterality: N/A;   HERNIA REPAIR Right 1981   inguinal   INTRAOPERATIVE CHOLANGIOGRAM N/A 04/19/2019   Procedure: INTRAOPERATIVE CHOLANGIOGRAM;  Surgeon: Ryan Husbands, MD;  Location: [facility] ORS;  Service: General;  Laterality: N/A;   SHOULDER SURGERY Right 2001   ROTATOR CUFF REPAIR   VENTRAL HERNIA REPAIR N/A 10/27/2019   Procedure: HERNIA REPAIR VENTRAL ADULT, Open;  Surgeon: Ryan Husbands, MD;  Location: [facility] ORS;  Service: General;  Laterality: N/A;    Prior to Admission medications   Medication Sig Start Date End Date Taking? Authorizing Provider  Multiple Vitamin (MULTIVITAMIN WITH MINERALS) TABS tablet Take 1 tablet by mouth daily.   Yes [provider]  nadolol (CORGARD) 20 MG tablet TAKE 1 TABLET(20 MG) BY MOUTH DAILY 05/23/22  Yes Ryan Bellows, MD  lactulose [facility]) 10 GM/15ML solution Take by mouth 3 (three) times daily as needed for mild constipation.    [provider]    Allergies Patient has no known allergies.  Family History  Problem Relation Age of Onset   Kidney cancer Mother    Lung cancer Mother    Colon cancer Father     Social History Social History   Tobacco Use   Smoking status: Never   Smokeless tobacco: Never  Vaping Use   Vaping Use: Never used  Substance Use Topics   Alcohol use: Not Currently   Drug use: No    Review of Systems Constitutional: No fever/chills Eyes: No visual changes. ENT: No sore throat. Cardiovascular: Denies chest pain. Respiratory: Denies shortness of breath. Gastrointestinal: No abdominal pain.  No nausea, no vomiting.  No diarrhea.  No constipation.  Esophageal varices and cirrhosis of the liver Genitourinary: Negative for dysuria. Musculoskeletal: Negative for back pain. Skin: Negative for rash. Neurological: Negative for headaches, focal weakness or numbness. Endocrine: Hyperlipidemia and hypertension Hematological/Lymphatic: Thrombocytopenia   ____________________________________________   PHYSICAL EXAM:  VITAL SIGNS: BP is 122/56, pulse 54, respiration 12, temperature 97.7, and patient is 96% O2 sat on room air.  Patient weighs 190 pounds and BMI is 30.67. Constitutional: Alert and oriented. Well appearing and in no acute distress. Eyes: Conjunctivae are normal. PERRL. EOMI. Head: Atraumatic. Nose: No congestion/rhinnorhea. Mouth/Throat: Mucous membranes are moist.  Oropharynx  non-erythematous. Neck: No stridor.  No cervical spine tenderness to palpation. Hematological/Lymphatic/Immunilogical: No cervical lymphadenopathy. Cardiovascular: Normal rate, regular rhythm. Grossly normal heart sounds.  Good peripheral circulation. Respiratory: Normal respiratory effort.  No retractions. Lungs CTAB. Gastrointestinal: Soft and nontender. No distention. No abdominal bruits. No CVA tenderness. Genitourinary:  Musculoskeletal: No lower extremity tenderness nor edema.  No joint effusions. Neurologic:  Normal speech and language. No gross focal neurologic deficits are appreciated. No gait instability. Skin:  Skin is warm, dry and intact. No rash noted. Psychiatric: Mood and affect are normal. Speech and behavior are normal.  ____________________________________________   LABS  _           Component Ref Range & Units 6 d ago (05/30/22) 1 yr ago (09/05/20) 1 yr ago (08/24/20) 1 yr ago (07/23/20) 1 yr ago (07/14/20) 2 yr ago (03/27/20) 2 yr ago (03/05/20)  Color, UA  amber Yellow R Amber CM Orange R  Orange R Yellow R  Clarity, UA  clear  Clear      Glucose, UA Negative Negative  Negative      Bilirubin, UA  neg Negative R Negative Negative R  Negative R Negative R  Ketones, UA  neg Negative R Negative Negative R  Negative R Negative R  Spec Grav, UA 1.010 - 1.025 1.025 1.010 R 1.020 1.025 R  >1.030 High  R 1.020 R  Blood, UA  neg 3+ Abnormal  R Positive CM 3+ Abnormal  R  2+ Abnormal  R 2+ Abnormal  R  pH, UA 5.0 - 8.0 6.0 7.0 R 6.0 5.5 R  6.0 R 6.5 R  Protein, UA Negative Negative Negative R  Positive Abnormal  CM Negative R  Negative R Negative R  Urobilinogen, UA 0.2 or 1.0 E.U./dL 0.2  1.0      Nitrite, UA  neg Negative R Negative Negative R  Negative R Negative R  Leukocytes, UA Negative Negative Trace Abnormal  Small (1+) Abnormal  Negative  Negative Trace Abnormal   Appearance  dark    CLOUDY Abnormal  R    Odor          Resulting Agency   [facility]  [facility]  [facility]                   Component Ref Range & Units 6 d ago (05/30/22) 1 yr ago (08/24/20) 1 yr ago (07/14/20) 1 yr ago (07/14/20) 1 yr ago (07/14/20) 1 yr ago (06/25/20) 1 yr ago (06/25/20)  Glucose 70 - 99 mg/dL 98 95 R   144 High  CM 74 R   Uric Acid 3.8 - 8.4 mg/dL 5.2 4.7 CM       Comment:            Therapeutic target for gout patients: <6.0  BUN 8 - 27 mg/dL 15 10   19 $ R 12   Creatinine, Ser 0.76 - 1.27 mg/dL 0.95 0.88   1.03 R 0.80   eGFR >59 mL/min/1.73 90 98    101 CM   BUN/Creatinine Ratio 10 - 24 16 11    15   $ Sodium 134 - 144 mmol/L 147 High  140   140 R 143   Potassium 3.5 - 5.2 mmol/L 5.4 High  4.4   4.6 R 4.4   Chloride 96 - 106 mmol/L 111 High  105   112 High  R 107 High    Calcium 8.6 - 10.2 mg/dL 9.6 9.3   9.1 R 9.3   Phosphorus 2.8 - 4.1 mg/dL 3.1 3.2       Total Protein 6.0 - 8.5 g/dL 6.6 6.8 6.4 Low  R   6.5   Albumin 3.9 - 4.9 g/dL 4.0 4.2 R 3.8 R   4.0 R   Globulin, Total 1.5 - 4.5 g/dL 2.6 2.6    2.5   Albumin/Globulin Ratio 1.2 - 2.2 1.5 1.6    1.6   Bilirubin Total 0.0 - 1.2 mg/dL 1.8 High  1.7 High  1.5 High  R   1.4 High    Alkaline Phosphatase 44 - 121 IU/L 81 78 67 R   81   LDH 121 - 224 IU/L 155 147       AST 0 - 40 IU/L 28 28 37 R   34   ALT 0 - 44 IU/L 20 16 23 $ R   21   GGT 0 - 65 IU/L 25 27       Iron 38 - 169 ug/dL 147 198 High        Cholesterol, Total 100 - 199 mg/dL 117 127       Triglycerides 0 - 149 mg/dL 73 83       HDL >39 mg/dL 45 45       VLDL Cholesterol Cal 5 - 40 mg/dL 15 16       LDL Chol Calc (NIH) 0 - 99 mg/dL 57 66       Chol/HDL Ratio 0.0 - 5.0 ratio 2.6 2.8 CM       Comment:  T. Chol/HDL Ratio                                             Men  Women                               1/2 Avg.Risk  3.4    3.3                                   Avg.Risk  5.0    4.4                                2X Avg.Risk  9.6    7.1                                3X Avg.Risk 23.4   11.0  Estimated  CHD Risk 0.0 - 1.0 times avg.  < 0.5  < 0.5 CM       Comment: The CHD Risk is based on the T. Chol/HDL ratio. Other factors affect CHD Risk such as hypertension, smoking, diabetes, severe obesity, and family history of premature CHD.  TSH 0.450 - 4.500 uIU/mL 2.920 3.440       T4, Total 4.5 - 12.0 ug/dL 7.7 8.9       T3 Uptake Ratio 24 - 39 % 26 26       Free Thyroxine Index 1.2 - 4.9 2.0 2.3       Prostate Specific Ag, Serum 0.0 - 4.0 ng/mL 1.5 1.2 CM       Comment: Roche ECLIA methodology. According to the American Urological Association, Serum PSA should decrease and remain at undetectable levels after radical prostatectomy. The AUA defines biochemical recurrence as an initial PSA value 0.2 ng/mL or greater followed by a subsequent confirmatory PSA value 0.2 ng/mL or greater. Values obtained with different assay methods or kits cannot be used interchangeably. Results cannot be interpreted as absolute evidence of the presence or absence of malignant disease.  WBC 3.4 - 10.8 x10E3/uL 2.3 Low Panic  2.6 Low   2.7 Low  R   2.8 Low   RBC 4.14 - 5.80 x10E6/uL 4.08 Low  3.79 Low   3.83 Low  R   4.02 Low   Comment: Polychromasia present  Hemoglobin 13.0 - 17.7 g/dL 13.3 13.1  12.9 Low  R   13.5  Hematocrit 37.5 - 51.0 % 38.7 36.4 Low   37.3 Low  R   38.0  MCV 79 - 97 fL 95 96  97.4 R   95  MCH 26.6 - 33.0 pg 32.6 34.6 High   33.7 R   33.6 High   MCHC 31.5 - 35.7 g/dL 34.4 36.0 High   34.6 R   35.5  RDW 11.6 - 15.4 % 13.1 13.0  13.7 R   13.1  Platelets 150 - 450 x10E3/uL 47 Low Panic  64 Low Panic  CM  61 Low  R, CM   63 Low Panic   Comment: Actual platelet count may be somewhat higher than reported due to aggregation of platelets in this sample.  Neutrophils Not Estab. %  57 62     60  Lymphs Not Estab. % 28 24     26  $ Monocytes Not Estab. % 10 9     9  $ Eos Not Estab. % 4 4     4  $ Basos Not Estab. % 1 1     1  $ Neutrophils Absolute 1.4 - 7.0 x10E3/uL 1.3 Low  1.6     1.6  Lymphocytes  Absolute 0.7 - 3.1 x10E3/uL 0.6 Low  0.6 Low      0.7  Monocytes Absolute 0.1 - 0.9 x10E3/uL 0.2 0.2     0.3  EOS (ABSOLUTE) 0.0 - 0.4 x10E3/uL 0.1 0.1     0.1  Basophils Absolute 0.0 - 0.2 x10E3/uL 0.0 0.0     0.0  Immature Granulocytes Not Estab. % 0 0     0  Immature Grans (Abs) 0.0 - 0.1 x10E3/uL 0.0 0.0     0.0                ___________________________________________  EKG  Sinus bradycardia 55 bpm ____________________________________________    ____________________________________________   INITIAL IMPRESSION / ASSESSMENT AND PLAN   As part of my medical decision making, I reviewed the following data within the [facility]       Discussed physical exam, EKG, and lab results with patient.  Patient to continue follow-up with oncologist and gastroenterologist.     ____________________________________________   FINAL CLINICAL IMPRESSION  Well exam  ED Discharge Orders     None        Note:  This document was prepared using Dragon voice recognition software and may include unintentional dictation errors.

## 2022-09-17 DIAGNOSIS — Z7689 Persons encountering health services in other specified circumstances: Secondary | ICD-10-CM | POA: Diagnosis not present

## 2022-09-18 ENCOUNTER — Encounter: Payer: Self-pay | Admitting: Internal Medicine

## 2022-09-30 ENCOUNTER — Encounter: Payer: Self-pay | Admitting: Internal Medicine

## 2022-09-30 ENCOUNTER — Other Ambulatory Visit: Payer: Self-pay

## 2022-09-30 ENCOUNTER — Inpatient Hospital Stay: Payer: 59 | Attending: Internal Medicine

## 2022-09-30 ENCOUNTER — Ambulatory Visit (INDEPENDENT_AMBULATORY_CARE_PROVIDER_SITE_OTHER): Payer: 59 | Admitting: Gastroenterology

## 2022-09-30 ENCOUNTER — Inpatient Hospital Stay (HOSPITAL_BASED_OUTPATIENT_CLINIC_OR_DEPARTMENT_OTHER): Payer: 59 | Admitting: Internal Medicine

## 2022-09-30 ENCOUNTER — Encounter: Payer: Self-pay | Admitting: Gastroenterology

## 2022-09-30 VITALS — BP 119/65 | HR 55 | Temp 96.9°F | Resp 17 | Wt 188.0 lb

## 2022-09-30 VITALS — BP 119/70 | HR 60 | Temp 98.5°F | Ht 66.0 in | Wt 188.0 lb

## 2022-09-30 DIAGNOSIS — K7460 Unspecified cirrhosis of liver: Secondary | ICD-10-CM | POA: Insufficient documentation

## 2022-09-30 DIAGNOSIS — I8500 Esophageal varices without bleeding: Secondary | ICD-10-CM

## 2022-09-30 DIAGNOSIS — D696 Thrombocytopenia, unspecified: Secondary | ICD-10-CM | POA: Diagnosis not present

## 2022-09-30 LAB — APTT: aPTT: 33 seconds (ref 24–36)

## 2022-09-30 LAB — COMPREHENSIVE METABOLIC PANEL
ALT: 22 U/L (ref 0–44)
AST: 32 U/L (ref 15–41)
Albumin: 3.8 g/dL (ref 3.5–5.0)
Alkaline Phosphatase: 64 U/L (ref 38–126)
Anion gap: 5 (ref 5–15)
BUN: 12 mg/dL (ref 8–23)
CO2: 23 mmol/L (ref 22–32)
Calcium: 8.7 mg/dL — ABNORMAL LOW (ref 8.9–10.3)
Chloride: 108 mmol/L (ref 98–111)
Creatinine, Ser: 0.77 mg/dL (ref 0.61–1.24)
GFR, Estimated: >60 mL/min (ref >60)
Glucose, Bld: 89 mg/dL (ref 70–99)
Potassium: 4.2 mmol/L (ref 3.5–5.1)
Sodium: 136 mmol/L (ref 135–145)
Total Bilirubin: 2.3 mg/dL — ABNORMAL HIGH (ref 0.3–1.2)
Total Protein: 6.7 g/dL (ref 6.5–8.1)

## 2022-09-30 LAB — CBC WITH DIFFERENTIAL/PLATELET
Abs Immature Granulocytes: 0.01 10*3/uL (ref 0.00–0.07)
Basophils Absolute: 0 10*3/uL (ref 0.0–0.1)
Basophils Relative: 1 %
Eosinophils Absolute: 0.1 10*3/uL (ref 0.0–0.5)
Eosinophils Relative: 3 %
HCT: 38.8 % — ABNORMAL LOW (ref 39.0–52.0)
Hemoglobin: 13.2 g/dL (ref 13.0–17.0)
Immature Granulocytes: 0 %
Lymphocytes Relative: 28 %
Lymphs Abs: 0.7 10*3/uL (ref 0.7–4.0)
MCH: 33.2 pg (ref 26.0–34.0)
MCHC: 34 g/dL (ref 30.0–36.0)
MCV: 97.7 fL (ref 80.0–100.0)
Monocytes Absolute: 0.3 10*3/uL (ref 0.1–1.0)
Monocytes Relative: 10 %
Neutro Abs: 1.4 10*3/uL — ABNORMAL LOW (ref 1.7–7.7)
Neutrophils Relative %: 58 %
Platelets: 50 10*3/uL — ABNORMAL LOW (ref 150–400)
RBC: 3.97 MIL/uL — ABNORMAL LOW (ref 4.22–5.81)
RDW: 13.9 % (ref 11.5–15.5)
WBC: 2.4 10*3/uL — ABNORMAL LOW (ref 4.0–10.5)
nRBC: 0 % (ref 0.0–0.2)

## 2022-09-30 LAB — PROTIME-INR
INR: 1.5 — ABNORMAL HIGH (ref 0.8–1.2)
Prothrombin Time: 17.9 seconds — ABNORMAL HIGH (ref 11.4–15.2)

## 2022-09-30 NOTE — Assessment & Plan Note (Addendum)
#  Leucopenia/ Thrombocytopenia/splenomegaly-Hypersplenism- [January 2019 platelets- between 106-97]-  # 2024-WBC- 2.3/ANC1.6 Hb -13 platelets 52 Trending down but overall clinically stable. Asymptomatic.  Continue surveillance. AFP- pending.   # Cirrhosis/splenomegaly [May 2020]  [Dr.Anna] [n-8.3]; intermittently elevated; monitor for now; EGD- SEP 2023- s/p banding. MRI AUG 2023- No enhancing lesion in the liver. No evidence of hepatocellular carcinoma. Portal hypertension with paraesophageal varices and splenomegaly. No ascites-  stable, awaiting [place] in June 2024 [Dr.Anna]   # DISPOSITION:  # follow up in in 6 months MD, labs -cbc/cmp/AFP/PT/PTT [[facility]]; - Dr.B

## 2022-09-30 NOTE — Progress Notes (Signed)
Retired. Energy not as good. Pt  has a good appetite. Bruises easily.No visible blood from anywhere. Mild edema in ankles at bedtime.

## 2022-09-30 NOTE — Progress Notes (Signed)
Wyline Mood MD, MRCP(U.K) [address]  [address]  [place], [place] [address]  Main: [phone]  Fax: [phone]   Primary Care Physician: Patient, No Pcp Per  Primary Gastroenterologist:  Dr. Wyline Mood   Chief Complaint  Patient presents with   Cirrhosis    HPI: Ryan Dacko. is a 62 y.o. male Summary of history :   H/o  liver cirrhosis secondary to NAFLD with non bleeding esophageal varices grade 1     CT scan on 07/15/17 which showed features of cirrhosis and portal hypertension with with splenomegaly, a recanalized left paraumbilical vein and small distal esophageal varices. His dad had colon cancer..    01/01/18 : colonoscopy- 3 mm tubular adenoma resected  03/2020 : Underwent cholecystectomy  03/19/2020 seen Dr. Donneta Romberg for thrombocytopenia.   04/23/2020: CT abdomen and pelvis: stable features of cirrhosis , moderate stool burden .  05/31/2020: EGD: Grade 1 esophageal varices. Commenced on 20 mg Nadolol.  09/16/2021: Hemoglobin 13.1 creatinine 0.75 INR 1.2 and bilirubin 1.0 normal transaminases.  AFP 9.8. 08/08/2021: Grade 2 esophageal varices seen 2 bands placed.  Recommended repeat upper endoscopy in 4 weeks.   Interval history 06/18/2021-09/30/2022 11/2021: EGD: Varices were noted and they were banded repeat endoscopy in 4 weeks is recommended.   12/15/2021 MRI liver shows hepatic cirrhosis no evidence OF HCC.  09/17/2022: Hemoglobin 12.8 g platelet count 44 chloride 110 creatinine 0.94, INR 1.3 AFP elevated at 9.3 stable    NSAID's: no  Low Salt Diet :  Yes Sleep pattern/confusion/memory issues: Occasionally has some issues with falling asleep Bowel  movements : Having regular bowel movements Heart rate around 60 bpm No other complaints     Current Outpatient Medications  Medication Sig Dispense Refill   lactulose (CHRONULAC) 10 GM/15ML solution Take by mouth 3 (three) times daily as needed for mild constipation.     Multiple Vitamin (MULTIVITAMIN  WITH MINERALS) TABS tablet Take 1 tablet by mouth daily.     nadolol (CORGARD) 20 MG tablet TAKE 1 TABLET(20 MG) BY MOUTH DAILY 90 tablet 3   No current facility-administered medications for this visit.    Allergies as of 09/30/2022   (No Known Allergies)      ROS:  General: Negative for anorexia, weight loss, fever, chills, fatigue, weakness. ENT: Negative for hoarseness, difficulty swallowing , nasal congestion. CV: Negative for chest pain, angina, palpitations, dyspnea on exertion, peripheral edema.  Respiratory: Negative for dyspnea at rest, dyspnea on exertion, cough, sputum, wheezing.  GI: See history of present illness. GU:  Negative for dysuria, hematuria, urinary incontinence, urinary frequency, nocturnal urination.  Endo: Negative for unusual weight change.    Physical Examination:   BP 119/70   Pulse 60   Temp 98.5 F (36.9 C) (Oral)   Ht 5\' 6"  (1.676 m)   Wt 188 lb (85.3 kg)   BMI 30.34 kg/m   General: Well-nourished, well-developed in no acute distress.  Eyes: No icterus. Conjunctivae pink. Mouth: Oropharyngeal mucosa moist and pink , no lesions erythema or exudate. Extremities: No lower extremity edema. No clubbing or deformities. Neuro: Alert and oriented x 3.  Grossly intact. Skin: Warm and dry, no jaundice.   Psych: Alert and cooperative, normal mood and affect.   Imaging Studies: No results found.  Assessment and Plan:   Ryan Artino. is a 62 y.o. y/o male here to follow-up for liver cirrhosis, compensated, non bleeding esophageal varices secondary to NAFLD.  He is compensated and  immune to hepatitis a and B.  No recent history of hepatic encephalopathy.  Meld score is 10 in 05/2021.     Plan 1.  Continue nadolol heart rate is 60 bpm at target 2.  Low-salt diet, avoid NSAID's 3.  Right upper quadrant ultrasound to screen for HCC 4.  EGD to eradicate any remaining esophageal varices 5.  Lactulose : continue to take at present dose which is  working well, discussed about starting on Xifaxan but he states he is not ready for it yet 6.  Thrombocytopenia recheck CBC today  I have discussed alternative options, risks & benefits,  which include, but are not limited to, bleeding, infection, perforation,respiratory complication & drug reaction.  The patient agrees with this plan & written consent will be obtained.      Dr Wyline Mood  MD,MRCP [facility]) Follow up in 6 months

## 2022-09-30 NOTE — Progress Notes (Signed)
[facility] CONSULT NOTE  Patient Care Team: Patient, No Pcp Per as PCP - General (General Practice) Earna Coder, MD as Consulting Physician (Oncology)  CHIEF COMPLAINTS/PURPOSE OF CONSULTATION: Thrombocytopenia  # THROMBOCYTOPENIA [97-108; incidental]; Hep B/hepC/HIV-NEG [[facility]] sec to cirrhosis/ splenomegaly  # Cirrhosis/ SPLENOMEGALY[US- 1000cc; CT A/P march 2019] s/p EGD/colo [may 2019- Dr.Anna]  #October 2020-kidney stones s/p stenting [Dr. Apolinar Junes; gallstone/cholecystitis-s/p cholecystectomy December 2020]  # Peyronie disease-   Oncology History   No history exists.   HISTORY OF PRESENTING ILLNESS: Ambulating independently.  Alone.  Ryan Willis 62 y.o.  male cirrhosis/splenomegaly and thrombocytopenia is here for follow-up/ review of labs [[facility]]  Energy not as good. Pt has a good appetite.   Bruises easily.No visible blood from anywhere. Mild edema in ankles at bedtime . Denies any blood in stools or black-colored stools.  No gum bleeding no nosebleeds.  Review of Systems  Constitutional:  Positive for weight loss. Negative for chills, diaphoresis, fever and malaise/fatigue.  HENT:  Negative for nosebleeds and sore throat.   Eyes:  Negative for double vision.  Respiratory:  Negative for cough, hemoptysis, sputum production, shortness of breath and wheezing.   Cardiovascular:  Negative for chest pain, palpitations, orthopnea and leg swelling.  Gastrointestinal:  Negative for abdominal pain, blood in stool, constipation, diarrhea, heartburn, melena, nausea and vomiting.  Genitourinary:  Negative for dysuria, frequency and urgency.  Musculoskeletal:  Negative for back pain and joint pain.  Skin: Negative.  Negative for itching and rash.  Neurological:  Negative for dizziness, tingling, focal weakness, weakness and headaches.  Endo/Heme/Allergies:  Bruises/bleeds easily.  Psychiatric/Behavioral:  Negative for depression. The patient is not  nervous/anxious and does not have insomnia.     MEDICAL HISTORY:  Past Medical History:  Diagnosis Date   Cirrhosis of liver (HCC)    Diverticulitis    Esophageal varices (HCC)    Family history of adverse reaction to anesthesia    MOTHER HAD N/V   History of kidney stones    Hypercholesteremia    Hypothyroidism    Peyronie's disease    Renal cyst, left    Seasonal allergies    Thrombocytopenia (HCC)     SURGICAL HISTORY: Past Surgical History:  Procedure Laterality Date   CHOLECYSTECTOMY     COLONOSCOPY  2006   COLONOSCOPY WITH PROPOFOL N/A 09/08/2017   Procedure: COLONOSCOPY WITH PROPOFOL;  Surgeon: Wyline Mood, MD;  Location: [facility] ENDOSCOPY;  Service: Gastroenterology;  Laterality: N/A;   COLONOSCOPY WITH PROPOFOL N/A 01/01/2018   Procedure: COLONOSCOPY WITH PROPOFOL;  Surgeon: Wyline Mood, MD;  Location: [facility] ENDOSCOPY;  Service: Gastroenterology;  Laterality: N/A;   CYSTOSCOPY/URETEROSCOPY/HOLMIUM LASER/STENT PLACEMENT Right 01/17/2019   Procedure: CYSTOSCOPY/URETEROSCOPY/STENT PLACEMENT;  Surgeon: Vanna Scotland, MD;  Location: [facility] ORS;  Service: Urology;  Laterality: Right;   CYSTOSCOPY/URETEROSCOPY/HOLMIUM LASER/STENT PLACEMENT Right 01/31/2019   Procedure: CYSTOSCOPY/URETEROSCOPY/HOLMIUM LASER/STENT EXCHANGE;  Surgeon: Vanna Scotland, MD;  Location: [facility] ORS;  Service: Urology;  Laterality: Right;   CYSTOSCOPY/URETEROSCOPY/HOLMIUM LASER/STENT PLACEMENT Left 08/13/2020   Procedure: CYSTOSCOPY/URETEROSCOPY/HOLMIUM LASER/STENT PLACEMENT;  Surgeon: Vanna Scotland, MD;  Location: [facility] ORS;  Service: Urology;  Laterality: Left;   ESOPHAGOGASTRODUODENOSCOPY (EGD) WITH PROPOFOL N/A 09/08/2017   Procedure: ESOPHAGOGASTRODUODENOSCOPY (EGD) WITH PROPOFOL;  Surgeon: Wyline Mood, MD;  Location: [facility] ENDOSCOPY;  Service: Gastroenterology;  Laterality: N/A;   ESOPHAGOGASTRODUODENOSCOPY (EGD) WITH PROPOFOL N/A 05/31/2020   Procedure: ESOPHAGOGASTRODUODENOSCOPY (EGD) WITH PROPOFOL;  Surgeon:  Wyline Mood, MD;  Location: [facility] ENDOSCOPY;  Service: Gastroenterology;  Laterality: N/A;  ESOPHAGOGASTRODUODENOSCOPY (EGD) WITH PROPOFOL N/A 07/05/2021   Procedure: ESOPHAGOGASTRODUODENOSCOPY (EGD) WITH PROPOFOL;  Surgeon: Wyline Mood, MD;  Location: [facility] ENDOSCOPY;  Service: Gastroenterology;  Laterality: N/A;  WIFE WILL BE DRIVER: WILL BE 20 - 30 MINUTES AWAY   ESOPHAGOGASTRODUODENOSCOPY (EGD) WITH PROPOFOL N/A 08/08/2021   Procedure: ESOPHAGOGASTRODUODENOSCOPY (EGD) WITH PROPOFOL;  Surgeon: Wyline Mood, MD;  Location: [facility] ENDOSCOPY;  Service: Gastroenterology;  Laterality: N/A;   ESOPHAGOGASTRODUODENOSCOPY (EGD) WITH PROPOFOL N/A 01/02/2022   Procedure: ESOPHAGOGASTRODUODENOSCOPY (EGD) WITH PROPOFOL;  Surgeon: Wyline Mood, MD;  Location: [facility] ENDOSCOPY;  Service: Gastroenterology;  Laterality: N/A;   HERNIA REPAIR Right 1981   inguinal   INTRAOPERATIVE CHOLANGIOGRAM N/A 04/19/2019   Procedure: INTRAOPERATIVE CHOLANGIOGRAM;  Surgeon: Leafy Ro, MD;  Location: [facility] ORS;  Service: General;  Laterality: N/A;   SHOULDER SURGERY Right 2001   ROTATOR CUFF REPAIR   VENTRAL HERNIA REPAIR N/A 10/27/2019   Procedure: HERNIA REPAIR VENTRAL ADULT, Open;  Surgeon: Leafy Ro, MD;  Location: [facility] ORS;  Service: General;  Laterality: N/A;    SOCIAL HISTORY:  Social History   Socioeconomic History   Marital status: Married    Spouse name: Not on file   Number of children: Not on file   Years of education: Not on file   Highest education level: Not on file  Occupational History   Not on file  Tobacco Use   Smoking status: Never   Smokeless tobacco: Never  Vaping Use   Vaping Use: Never used  Substance and Sexual Activity   Alcohol use: Not Currently   Drug use: No   Sexual activity: Not on file  Other Topics Concern   Not on file  Social History Narrative   retd. Emergency planning/management officer; part time information specialist; never smoked; no alcohol; live in [place]. Lives with wife.    Social  Determinants of Health   Financial Resource Strain: Not on file  Food Insecurity: Not on file  Transportation Needs: Not on file  Physical Activity: Not on file  Stress: Not on file  Social Connections: Not on file  Intimate Partner Violence: Not on file    FAMILY HISTORY: colon father- 63 year; mother- kidney cancer; died of lung cancer [2018] Family History  Problem Relation Age of Onset   Kidney cancer Mother    Lung cancer Mother    Colon cancer Father     ALLERGIES:  has No Known Allergies.  MEDICATIONS:  Current Outpatient Medications  Medication Sig Dispense Refill   lactulose (CHRONULAC) 10 GM/15ML solution Take by mouth 3 (three) times daily as needed for mild constipation.     Multiple Vitamin (MULTIVITAMIN WITH MINERALS) TABS tablet Take 1 tablet by mouth daily.     nadolol (CORGARD) 20 MG tablet TAKE 1 TABLET(20 MG) BY MOUTH DAILY 90 tablet 3   No current facility-administered medications for this visit.   PHYSICAL EXAMINATION: ECOG PERFORMANCE STATUS: 0 - Asymptomatic  Vitals:   09/30/22 1358  BP: 119/65  Pulse: (!) 55  Resp: 17  Temp: (!) 96.9 F (36.1 C)  SpO2: 98%   Filed Weights   09/30/22 1358  Weight: 188 lb (85.3 kg)   Physical Exam HENT:     Head: Normocephalic and atraumatic.     Mouth/Throat:     Pharynx: No oropharyngeal exudate.  Eyes:     Pupils: Pupils are equal, round, and reactive to light.  Cardiovascular:     Rate and Rhythm: Normal rate and regular rhythm.  Pulmonary:  Effort: No respiratory distress.     Breath sounds: No wheezing.  Abdominal:     General: Bowel sounds are normal. There is no distension.     Palpations: Abdomen is soft. There is no mass.     Tenderness: There is no abdominal tenderness. There is no guarding or rebound.  Musculoskeletal:        General: No tenderness. Normal range of motion.     Cervical back: Normal range of motion and neck supple.  Skin:    General: Skin is warm.  Neurological:      Mental Status: He is alert and oriented to person, place, and time.  Psychiatric:        Mood and Affect: Affect normal.      LABORATORY DATA:  I have reviewed the data as listed Lab Results  Component Value Date   WBC 2.4 (L) 09/30/2022   HGB 13.2 09/30/2022   HCT 38.8 (L) 09/30/2022   MCV 97.7 09/30/2022   PLT 50 (L) 09/30/2022   Recent Labs    05/30/22 0828 09/30/22 1346  NA 147* 136  K 5.4* 4.2  CL 111* 108  CO2  --  23  GLUCOSE 98 89  BUN 15 12  CREATININE 0.95 0.77  CALCIUM 9.6 8.7*  GFRNONAA  --  >60  PROT 6.6 6.7  ALBUMIN 4.0 3.8  AST 28 32  ALT 20 22  ALKPHOS 81 64  BILITOT 1.8* 2.3*     RADIOGRAPHIC STUDIES: I have personally reviewed the radiological images as listed and agreed with the findings in the report. No results found.  ASSESSMENT & PLAN:   Thrombocytopenia (HCC) #Leucopenia/ Thrombocytopenia/splenomegaly-Hypersplenism- [January 2019 platelets- between 106-97]-  # 2024-WBC- 2.3/ANC1.6 Hb -13 platelets 52 Trending down but overall clinically stable. Asymptomatic.  Continue surveillance. AFP- pending.   # Cirrhosis/splenomegaly [May 2020]  [Dr.Anna] [n-8.3]; intermittently elevated; monitor for now; EGD- SEP 2023- s/p banding. MRI AUG 2023- No enhancing lesion in the liver. No evidence of hepatocellular carcinoma. Portal hypertension with paraesophageal varices and splenomegaly. No ascites-  stable, awaiting [place] in June 2024 [Dr.Anna]   # DISPOSITION:  # follow up in in 6 months MD, labs -cbc/cmp/AFP/PT/PTT [[facility]]; - Dr.B   All questions were answered. The patient knows to call the clinic with any problems, questions or concerns.    Earna Coder, MD 09/30/2022 2:39 PM

## 2022-10-01 LAB — CBC WITH DIFFERENTIAL/PLATELET
Basophils Absolute: 0 10*3/uL (ref 0.0–0.2)
Basos: 1 %
EOS (ABSOLUTE): 0.1 10*3/uL (ref 0.0–0.4)
Eos: 3 %
Hematocrit: 37.2 % — ABNORMAL LOW (ref 37.5–51.0)
Hemoglobin: 12.8 g/dL — ABNORMAL LOW (ref 13.0–17.7)
Immature Grans (Abs): 0 10*3/uL (ref 0.0–0.1)
Immature Granulocytes: 0 %
Lymphocytes Absolute: 0.7 10*3/uL (ref 0.7–3.1)
Lymphs: 31 %
MCH: 32.7 pg (ref 26.6–33.0)
MCHC: 34.4 g/dL (ref 31.5–35.7)
MCV: 95 fL (ref 79–97)
Monocytes Absolute: 0.2 10*3/uL (ref 0.1–0.9)
Monocytes: 10 %
Neutrophils Absolute: 1.2 10*3/uL — ABNORMAL LOW (ref 1.4–7.0)
Neutrophils: 55 %
Platelets: 49 10*3/uL — CL (ref 150–450)
RBC: 3.91 x10E6/uL — ABNORMAL LOW (ref 4.14–5.80)
RDW: 13.3 % (ref 11.6–15.4)
WBC: 2.3 10*3/uL — CL (ref 3.4–10.8)

## 2022-10-02 LAB — AFP TUMOR MARKER: AFP, Serum, Tumor Marker: 9 ng/mL — ABNORMAL HIGH (ref 0.0–8.4)

## 2022-10-06 ENCOUNTER — Ambulatory Visit
Admission: RE | Admit: 2022-10-06 | Discharge: 2022-10-06 | Disposition: A | Discharge status: Home / Self Care | Payer: 59 | Source: Ambulatory Visit | Attending: Gastroenterology | Admitting: Gastroenterology

## 2022-10-06 DIAGNOSIS — K7460 Unspecified cirrhosis of liver: Secondary | ICD-10-CM | POA: Insufficient documentation

## 2022-10-10 ENCOUNTER — Ambulatory Visit
Admission: RE | Admit: 2022-10-10 | Discharge: 2022-10-10 | Disposition: A | Discharge status: Home / Self Care | Payer: 59 | Attending: Gastroenterology | Admitting: Gastroenterology

## 2022-10-10 ENCOUNTER — Ambulatory Visit: Payer: 59 | Admitting: General Practice

## 2022-10-10 ENCOUNTER — Encounter
Admission: RE | Disposition: A | Discharge status: Home / Self Care | Payer: Self-pay | Source: Home / Self Care | Attending: Gastroenterology

## 2022-10-10 DIAGNOSIS — Z800 Family history of malignant neoplasm of digestive organs: Secondary | ICD-10-CM | POA: Insufficient documentation

## 2022-10-10 DIAGNOSIS — I8510 Secondary esophageal varices without bleeding: Secondary | ICD-10-CM | POA: Insufficient documentation

## 2022-10-10 DIAGNOSIS — K7460 Unspecified cirrhosis of liver: Secondary | ICD-10-CM | POA: Diagnosis not present

## 2022-10-10 DIAGNOSIS — I8500 Esophageal varices without bleeding: Secondary | ICD-10-CM | POA: Diagnosis not present

## 2022-10-10 HISTORY — PX: ESOPHAGOGASTRODUODENOSCOPY (EGD) WITH PROPOFOL: SHX5813

## 2022-10-10 SURGERY — ESOPHAGOGASTRODUODENOSCOPY (EGD) WITH PROPOFOL
Anesthesia: General

## 2022-10-10 MED ORDER — SODIUM CHLORIDE 0.9 % IV SOLN
INTRAVENOUS | Status: DC
  Administered 2022-10-10: 20 mL/h via INTRAVENOUS

## 2022-10-10 MED ORDER — PROPOFOL 10 MG/ML IV BOLUS: INTRAVENOUS | Status: DC | PRN

## 2022-10-10 MED ORDER — PROPOFOL 500 MG/50ML IV EMUL
INTRAVENOUS | Status: DC | PRN
  Administered 2022-10-10: 150 ug/kg/min via INTRAVENOUS

## 2022-10-10 MED ORDER — LIDOCAINE HCL (CARDIAC) PF 100 MG/5ML IV SOSY
PREFILLED_SYRINGE | INTRAVENOUS | Status: DC | PRN
  Administered 2022-10-10: 50 mg via INTRAVENOUS

## 2022-10-10 MED ORDER — PROPOFOL 10 MG/ML IV BOLUS
INTRAVENOUS | Status: DC | PRN
  Administered 2022-10-10: 50 mg via INTRAVENOUS

## 2022-10-10 NOTE — Anesthesia Postprocedure Evaluation (Signed)
Anesthesia Post Note  Patient: Ryan Willis.  Procedure(s) Performed: ESOPHAGOGASTRODUODENOSCOPY (EGD) WITH PROPOFOL  Patient location during evaluation: Endoscopy Anesthesia Type: General Level of consciousness: awake and alert Pain management: pain level controlled Vital Signs Assessment: post-procedure vital signs reviewed and stable Respiratory status: spontaneous breathing, nonlabored ventilation, respiratory function stable and patient connected to nasal cannula oxygen Cardiovascular status: blood pressure returned to baseline and stable Postop Assessment: no apparent nausea or vomiting Anesthetic complications: no  No notable events documented.   Last Vitals:  Vitals:   10/10/22 0950 10/10/22 1005  BP: 112/69   Pulse:    Resp:    Temp:    SpO2:  96%    Last Pain:  Vitals:   10/10/22 1005  TempSrc:   PainSc: 0-No pain                 Stephanie Coup

## 2022-10-10 NOTE — Op Note (Signed)
[facility] Gastroenterology Patient Name: Ryan Willis Procedure Date: 10/10/2022 9:13 AM MRN: [id number] Account #: [id number] Date of Birth: May 17, 1960 Admit Type: Outpatient Age: 62 Room: [facility] ENDO ROOM 4 Gender: Male Note Status: Finalized Instrument Name: Upper Endoscope [id number] Procedure:             Upper GI endoscopy Indications:           Follow-up of esophageal varices Providers:             Wyline Mood MD, MD Referring MD:          No Local Md, MD (Referring MD) Medicines:             Monitored Anesthesia Care Complications:         No immediate complications. Procedure:             Pre-Anesthesia Assessment:                        - Prior to the procedure, a History and Physical was                         performed, and patient medications, allergies and                         sensitivities were reviewed. The patient's tolerance                         of previous anesthesia was reviewed.                        - The risks and benefits of the procedure and the                         sedation options and risks were discussed with the                         patient. All questions were answered and informed                         consent was obtained.                        - ASA Grade Assessment: II - A patient with mild                         systemic disease.                        - After reviewing the risks and benefits, the patient                         was deemed in satisfactory condition to undergo the                         procedure.                        After obtaining informed consent, the endoscope was                         passed under  direct vision. Throughout the procedure,                         the patient's blood pressure, pulse, and oxygen                         saturations were monitored continuously. The Endoscope                         was introduced through the mouth, and advanced to the                          third part of duodenum. The upper GI endoscopy was                         accomplished with ease. The patient tolerated the                         procedure well. Findings:      The stomach was normal.      The examined duodenum was normal.      The cardia and gastric fundus were normal on retroflexion.      Grade I varices were found in the lower third of the esophagus. They       were small in size. Impression:            - Normal stomach.                        - Normal examined duodenum.                        - Grade I esophageal varices.                        - No specimens collected. Recommendation:        - Discharge patient to home (with escort).                        - Resume previous diet.                        - Continue present medications.                        - Repeat upper endoscopy in 1 year for surveillance. Procedure Code(s):     --- Professional ---                        [phone], Esophagogastroduodenoscopy, flexible,                         transoral; diagnostic, including collection of                         specimen(s) by brushing or washing, when performed                         (separate procedure) Diagnosis Code(s):     --- Professional ---  I85.00, Esophageal varices without bleeding CPT copyright 2022 American Medical Association. All rights reserved. The codes documented in this report are preliminary and upon coder review may  be revised to meet current compliance requirements. Wyline Mood, MD Wyline Mood MD, MD 10/10/2022 9:37:34 AM This report has been signed electronically. Number of Addenda: 0 Note Initiated On: 10/10/2022 9:13 AM Estimated Blood Loss:  Estimated blood loss: none.      [facility]

## 2022-10-10 NOTE — Anesthesia Preprocedure Evaluation (Signed)
Anesthesia Evaluation  Patient identified by MRN, date of birth, ID band Patient awake    Reviewed: Allergy & Precautions, NPO status , Patient's Chart, lab work & pertinent test results  Airway Mallampati: III  TM Distance: >3 FB Neck ROM: full    Dental  (+) Dental Advidsory Given   Pulmonary neg pulmonary ROS, neg COPD   Pulmonary exam normal        Cardiovascular (-) Past MI negative cardio ROS Normal cardiovascular exam(-) dysrhythmias      Neuro/Psych negative neurological ROS  negative psych ROS   GI/Hepatic negative GI ROS, Neg liver ROS,,,  Endo/Other  negative endocrine ROS    Renal/GU negative Renal ROS  negative genitourinary   Musculoskeletal   Abdominal   Peds  Hematology negative hematology ROS (+)   Anesthesia Other Findings Past Medical History: No date: Cirrhosis of liver (HCC) No date: Diverticulitis No date: Esophageal varices (HCC) No date: Family history of adverse reaction to anesthesia     Comment:  MOTHER HAD N/V No date: History of kidney stones No date: Hypercholesteremia No date: Hypothyroidism No date: Peyronie's disease No date: Renal cyst, left No date: Seasonal allergies No date: Thrombocytopenia (HCC)  Past Surgical History: No date: CHOLECYSTECTOMY 2006: COLONOSCOPY 09/08/2017: COLONOSCOPY WITH PROPOFOL; N/A     Comment:  Procedure: COLONOSCOPY WITH PROPOFOL;  Surgeon: Wyline Mood, MD;  Location: [facility] ENDOSCOPY;  Service:               Gastroenterology;  Laterality: N/A; 01/01/2018: COLONOSCOPY WITH PROPOFOL; N/A     Comment:  Procedure: COLONOSCOPY WITH PROPOFOL;  Surgeon: Wyline Mood, MD;  Location: [facility] ENDOSCOPY;  Service:               Gastroenterology;  Laterality: N/A; 01/17/2019: CYSTOSCOPY/URETEROSCOPY/HOLMIUM LASER/STENT PLACEMENT;  Right     Comment:  Procedure: CYSTOSCOPY/URETEROSCOPY/STENT PLACEMENT;                Surgeon:  Vanna Scotland, MD;  Location: [facility] ORS;                Service: Urology;  Laterality: Right; 01/31/2019: CYSTOSCOPY/URETEROSCOPY/HOLMIUM LASER/STENT PLACEMENT;  Right     Comment:  Procedure: CYSTOSCOPY/URETEROSCOPY/HOLMIUM LASER/STENT               EXCHANGE;  Surgeon: Vanna Scotland, MD;  Location: [facility]               ORS;  Service: Urology;  Laterality: Right; 08/13/2020: CYSTOSCOPY/URETEROSCOPY/HOLMIUM LASER/STENT PLACEMENT; Left     Comment:  Procedure: CYSTOSCOPY/URETEROSCOPY/HOLMIUM LASER/STENT               PLACEMENT;  Surgeon: Vanna Scotland, MD;  Location: [facility]              ORS;  Service: Urology;  Laterality: Left; 09/08/2017: ESOPHAGOGASTRODUODENOSCOPY (EGD) WITH PROPOFOL; N/A     Comment:  Procedure: ESOPHAGOGASTRODUODENOSCOPY (EGD) WITH               PROPOFOL;  Surgeon: Wyline Mood, MD;  Location: [facility]               ENDOSCOPY;  Service: Gastroenterology;  Laterality: N/A; 05/31/2020: ESOPHAGOGASTRODUODENOSCOPY (EGD) WITH PROPOFOL; N/A     Comment:  Procedure: ESOPHAGOGASTRODUODENOSCOPY (EGD) WITH               PROPOFOL;  Surgeon: Tobi Bastos,  Sharlet Salina, MD;  Location: [facility]               ENDOSCOPY;  Service: Gastroenterology;  Laterality: N/A; 07/05/2021: ESOPHAGOGASTRODUODENOSCOPY (EGD) WITH PROPOFOL; N/A     Comment:  Procedure: ESOPHAGOGASTRODUODENOSCOPY (EGD) WITH               PROPOFOL;  Surgeon: Wyline Mood, MD;  Location: [facility]               ENDOSCOPY;  Service: Gastroenterology;  Laterality: N/A;               WIFE WILL BE DRIVER: WILL BE 20 - 30 MINUTES AWAY 08/08/2021: ESOPHAGOGASTRODUODENOSCOPY (EGD) WITH PROPOFOL; N/A     Comment:  Procedure: ESOPHAGOGASTRODUODENOSCOPY (EGD) WITH               PROPOFOL;  Surgeon: Wyline Mood, MD;  Location: [facility]               ENDOSCOPY;  Service: Gastroenterology;  Laterality: N/A; 01/02/2022: ESOPHAGOGASTRODUODENOSCOPY (EGD) WITH PROPOFOL; N/A     Comment:  Procedure: ESOPHAGOGASTRODUODENOSCOPY (EGD) WITH               PROPOFOL;  Surgeon: Wyline Mood, MD;  Location: [facility]               ENDOSCOPY;  Service: Gastroenterology;  Laterality: N/A; 1981: HERNIA REPAIR; Right     Comment:  inguinal 04/19/2019: INTRAOPERATIVE CHOLANGIOGRAM; N/A     Comment:  Procedure: INTRAOPERATIVE CHOLANGIOGRAM;  Surgeon:               Leafy Ro, MD;  Location: [facility] ORS;  Service:               General;  Laterality: N/A; 2001: SHOULDER SURGERY; Right     Comment:  ROTATOR CUFF REPAIR 10/27/2019: VENTRAL HERNIA REPAIR; N/A     Comment:  Procedure: HERNIA REPAIR VENTRAL ADULT, Open;  Surgeon:               Leafy Ro, MD;  Location: [facility] ORS;  Service:               General;  Laterality: N/A;  BMI    Body Mass Index: 30.28 kg/m      Reproductive/Obstetrics negative OB ROS                             Anesthesia Physical Anesthesia Plan  ASA: 2  Anesthesia Plan: General   Post-op Pain Management: Minimal or no pain anticipated   Induction: Intravenous  PONV Risk Score and Plan: 3 and Propofol infusion, TIVA and Ondansetron  Airway Management Planned: Nasal Cannula  Additional Equipment: None  Intra-op Plan:   Post-operative Plan:   Informed Consent: I have reviewed the patients History and Physical, chart, labs and discussed the procedure including the risks, benefits and alternatives for the proposed anesthesia with the patient or authorized representative who has indicated his/her understanding and acceptance.     Dental advisory given  Plan Discussed with: CRNA and Surgeon  Anesthesia Plan Comments: (Discussed risks of anesthesia with patient, including possibility of difficulty with spontaneous ventilation under anesthesia necessitating airway intervention, PONV, and rare risks such as cardiac or respiratory or neurological events, and allergic reactions. Discussed the role of CRNA in patient's perioperative care. Patient understands.)       Anesthesia Quick Evaluation

## 2022-10-10 NOTE — Transfer of Care (Signed)
Immediate Anesthesia Transfer of Care Note  Patient: Ryan Willis.  Procedure(s) Performed: ESOPHAGOGASTRODUODENOSCOPY (EGD) WITH PROPOFOL  Patient Location: PACU and Endoscopy Unit  Anesthesia Type:General  Level of Consciousness: awake, drowsy, and patient cooperative  Airway & Oxygen Therapy: Patient Spontanous Breathing and Patient connected to nasal cannula oxygen  Post-op Assessment: Report given to RN and Post -op Vital signs reviewed and stable  Post vital signs: Reviewed and stable  Last Vitals:  Vitals Value Taken Time  BP 97/62 10/10/22 0941  Temp    Pulse 56 10/10/22 0941  Resp 13 10/10/22 0941  SpO2 96 % 10/10/22 0941  Vitals shown include unvalidated device data.  Last Pain:  Vitals:   10/10/22 0940  TempSrc:   PainSc: 0-No pain         Complications: No notable events documented.

## 2022-10-10 NOTE — H&P (Signed)
Wyline Mood, MD [address], [place], [place], [address] [address], [address], [place], [place], [address] Phone: [phone]  Fax: [phone]  Primary Care Physician:  Patient, No Pcp Per   Pre-Procedure History & Physical: HPI:  Ryan Kirchberg. is a 62 y.o. male is here for an endoscopy    Past Medical History:  Diagnosis Date   Cirrhosis of liver (HCC)    Diverticulitis    Esophageal varices (HCC)    Family history of adverse reaction to anesthesia    MOTHER HAD N/V   History of kidney stones    Hypercholesteremia    Hypothyroidism    Peyronie's disease    Renal cyst, left    Seasonal allergies    Thrombocytopenia (HCC)     Past Surgical History:  Procedure Laterality Date   CHOLECYSTECTOMY     COLONOSCOPY  2006   COLONOSCOPY WITH PROPOFOL N/A 09/08/2017   Procedure: COLONOSCOPY WITH PROPOFOL;  Surgeon: Wyline Mood, MD;  Location: [facility] ENDOSCOPY;  Service: Gastroenterology;  Laterality: N/A;   COLONOSCOPY WITH PROPOFOL N/A 01/01/2018   Procedure: COLONOSCOPY WITH PROPOFOL;  Surgeon: Wyline Mood, MD;  Location: [facility] ENDOSCOPY;  Service: Gastroenterology;  Laterality: N/A;   CYSTOSCOPY/URETEROSCOPY/HOLMIUM LASER/STENT PLACEMENT Right 01/17/2019   Procedure: CYSTOSCOPY/URETEROSCOPY/STENT PLACEMENT;  Surgeon: Vanna Scotland, MD;  Location: [facility] ORS;  Service: Urology;  Laterality: Right;   CYSTOSCOPY/URETEROSCOPY/HOLMIUM LASER/STENT PLACEMENT Right 01/31/2019   Procedure: CYSTOSCOPY/URETEROSCOPY/HOLMIUM LASER/STENT EXCHANGE;  Surgeon: Vanna Scotland, MD;  Location: [facility] ORS;  Service: Urology;  Laterality: Right;   CYSTOSCOPY/URETEROSCOPY/HOLMIUM LASER/STENT PLACEMENT Left 08/13/2020   Procedure: CYSTOSCOPY/URETEROSCOPY/HOLMIUM LASER/STENT PLACEMENT;  Surgeon: Vanna Scotland, MD;  Location: [facility] ORS;  Service: Urology;  Laterality: Left;   ESOPHAGOGASTRODUODENOSCOPY (EGD) WITH PROPOFOL N/A 09/08/2017   Procedure: ESOPHAGOGASTRODUODENOSCOPY (EGD) WITH  PROPOFOL;  Surgeon: Wyline Mood, MD;  Location[facility] ENDOSCOPY;  Service: Gastroenterology;  Laterality: N/A;   ESOPHAGOGASTRODUODENOSCOPY (EGD) WITH PROPOFOL N/A 05/31/2020   Procedure: ESOPHAGOGASTRODUODENOSCOPY (EGD) WITH PROPOFOL;  Surgeon: Wyline Mood, MD;  Location: [facility] ENDOSCOPY;  Service: Gastroenterology;  Laterality: N/A;   ESOPHAGOGASTRODUODENOSCOPY (EGD) WITH PROPOFOL N/A 07/05/2021   Procedure: ESOPHAGOGASTRODUODENOSCOPY (EGD) WITH PROPOFOL;  Surgeon: Wyline Mood, MD;  Location: [facility] ENDOSCOPY;  Service: Gastroenterology;  Laterality: N/A;  WIFE WILL BE DRIVER: WILL BE 20 - 30 MINUTES AWAY   ESOPHAGOGASTRODUODENOSCOPY (EGD) WITH PROPOFOL N/A 08/08/2021   Procedure: ESOPHAGOGASTRODUODENOSCOPY (EGD) WITH PROPOFOL;  Surgeon: Wyline Mood, MD;  Location: [facility] ENDOSCOPY;  Service: Gastroenterology;  Laterality: N/A;   ESOPHAGOGASTRODUODENOSCOPY (EGD) WITH PROPOFOL N/A 01/02/2022   Procedure: ESOPHAGOGASTRODUODENOSCOPY (EGD) WITH PROPOFOL;  Surgeon: Wyline Mood, MD;  Location: [facility] ENDOSCOPY;  Service: Gastroenterology;  Laterality: N/A;   HERNIA REPAIR Right 1981   inguinal   INTRAOPERATIVE CHOLANGIOGRAM N/A 04/19/2019   Procedure: INTRAOPERATIVE CHOLANGIOGRAM;  Surgeon: Leafy Ro, MD;  Location: [facility] ORS;  Service: General;  Laterality: N/A;   SHOULDER SURGERY Right 2001   ROTATOR CUFF REPAIR   VENTRAL HERNIA REPAIR N/A 10/27/2019   Procedure: HERNIA REPAIR VENTRAL ADULT, Open;  Surgeon: Leafy Ro, MD;  Location: [facility] ORS;  Service: General;  Laterality: N/A;    Prior to Admission medications   Medication Sig Start Date End Date Taking? Authorizing Provider  lactulose (CHRONULAC) 10 GM/15ML solution Take by mouth 3 (three) times daily as needed for mild constipation.   Yes [provider]  Multiple Vitamin (MULTIVITAMIN WITH MINERALS) TABS tablet Take 1 tablet by mouth daily.   Yes [provider]  nadolol (  CORGARD) 20 MG tablet TAKE 1 TABLET(20 MG) BY MOUTH DAILY  05/23/22  Yes Wyline Mood, MD    Allergies as of 09/30/2022   (No Known Allergies)    Family History  Problem Relation Age of Onset   Kidney cancer Mother    Lung cancer Mother    Colon cancer Father     Social History   Socioeconomic History   Marital status: Married    Spouse name: Not on file   Number of children: Not on file   Years of education: Not on file   Highest education level: Not on file  Occupational History   Not on file  Tobacco Use   Smoking status: Never   Smokeless tobacco: Never  Vaping Use   Vaping Use: Never used  Substance and Sexual Activity   Alcohol use: Not Currently   Drug use: No   Sexual activity: Not on file  Other Topics Concern   Not on file  Social History Narrative   retd. Emergency planning/management officer; part time information specialist; never smoked; no alcohol; live in [place]. Lives with wife.    Social Determinants of Health   Financial Resource Strain: Not on file  Food Insecurity: Not on file  Transportation Needs: Not on file  Physical Activity: Not on file  Stress: Not on file  Social Connections: Not on file  Intimate Partner Violence: Not on file    Review of Systems: See HPI, otherwise negative ROS  Physical Exam: BP (!) 110/59   Pulse (!) 51   Temp (!) 96.4 F (35.8 C) (Temporal)   Resp 20   Ht 5\' 6"  (1.676 m)   Wt 85.1 kg   SpO2 97%   BMI 30.28 kg/m  General:   Alert,  pleasant and cooperative in NAD Head:  Normocephalic and atraumatic. Neck:  Supple; no masses or thyromegaly. Lungs:  Clear throughout to auscultation, normal respiratory effort.    Heart:  +S1, +S2, Regular rate and rhythm, No edema. Abdomen:  Soft, nontender and nondistended. Normal bowel sounds, without guarding, and without rebound.   Neurologic:  Alert and  oriented x4;  grossly normal neurologically.  Impression/Plan: Ryan Willis. is here for an endoscopy  to be performed for  evaluation of esophageal varices    Risks, benefits,  limitations, and alternatives regarding endoscopy have been reviewed with the patient.  Questions have been answered.  All parties agreeable.   Wyline Mood, MD  10/10/2022, 8:35 AM

## 2022-10-13 ENCOUNTER — Encounter: Payer: Self-pay | Admitting: Gastroenterology

## 2022-11-20 DIAGNOSIS — H5203 Hypermetropia, bilateral: Secondary | ICD-10-CM | POA: Diagnosis not present

## 2023-03-11 ENCOUNTER — Ambulatory Visit: Payer: Self-pay

## 2023-03-11 DIAGNOSIS — Z0000 Encounter for general adult medical examination without abnormal findings: Secondary | ICD-10-CM

## 2023-03-11 NOTE — Progress Notes (Signed)
Pt completed labs for physical, Pt will void at scheduled apt.

## 2023-03-12 LAB — CMP12+LP+TP+TSH+6AC+PSA+CBC…
ALT: 23 [IU]/L (ref 0–44)
AST: 35 [IU]/L (ref 0–40)
Albumin: 3.9 g/dL (ref 3.9–4.9)
Alkaline Phosphatase: 103 [IU]/L (ref 44–121)
BUN/Creatinine Ratio: 11 (ref 10–24)
BUN: 12 mg/dL (ref 8–27)
Basophils Absolute: 0 10*3/uL (ref 0.0–0.2)
Basos: 1 %
Bilirubin Total: 1.4 mg/dL — ABNORMAL HIGH (ref 0.0–1.2)
Calcium: 9.4 mg/dL (ref 8.6–10.2)
Chloride: 107 mmol/L — ABNORMAL HIGH (ref 96–106)
Chol/HDL Ratio: 2.4 ratio (ref 0.0–5.0)
Cholesterol, Total: 111 mg/dL (ref 100–199)
Creatinine, Ser: 1.05 mg/dL (ref 0.76–1.27)
EOS (ABSOLUTE): 0.1 10*3/uL (ref 0.0–0.4)
Eos: 3 %
Estimated CHD Risk: <0.5 times avg. (ref 0.0–1.0)
Free Thyroxine Index: 2.2 (ref 1.2–4.9)
GGT: 25 [IU]/L (ref 0–65)
Globulin, Total: 2.6 g/dL (ref 1.5–4.5)
Glucose: 91 mg/dL (ref 70–99)
HDL: 47 mg/dL (ref >39)
Hematocrit: 39 % (ref 37.5–51.0)
Hemoglobin: 13.1 g/dL (ref 13.0–17.7)
Immature Grans (Abs): 0 10*3/uL (ref 0.0–0.1)
Immature Granulocytes: 0 %
Iron: 81 ug/dL (ref 38–169)
LDH: 179 [IU]/L (ref 121–224)
LDL Chol Calc (NIH): 49 mg/dL (ref 0–99)
Lymphocytes Absolute: 0.6 10*3/uL — ABNORMAL LOW (ref 0.7–3.1)
Lymphs: 29 %
MCH: 33.5 pg — ABNORMAL HIGH (ref 26.6–33.0)
MCHC: 33.6 g/dL (ref 31.5–35.7)
MCV: 100 fL — ABNORMAL HIGH (ref 79–97)
Monocytes Absolute: 0.2 10*3/uL (ref 0.1–0.9)
Monocytes: 9 %
Neutrophils Absolute: 1.2 10*3/uL — ABNORMAL LOW (ref 1.4–7.0)
Neutrophils: 58 %
Phosphorus: 2.9 mg/dL (ref 2.8–4.1)
Platelets: 52 10*3/uL — CL (ref 150–450)
Potassium: 4.9 mmol/L (ref 3.5–5.2)
Prostate Specific Ag, Serum: 1.5 ng/mL (ref 0.0–4.0)
RBC: 3.91 x10E6/uL — ABNORMAL LOW (ref 4.14–5.80)
RDW: 13.4 % (ref 11.6–15.4)
Sodium: 144 mmol/L (ref 134–144)
T3 Uptake Ratio: 28 % (ref 24–39)
T4, Total: 7.9 ug/dL (ref 4.5–12.0)
TSH: 3.09 u[IU]/mL (ref 0.450–4.500)
Total Protein: 6.5 g/dL (ref 6.0–8.5)
Triglycerides: 69 mg/dL (ref 0–149)
Uric Acid: 4.3 mg/dL (ref 3.8–8.4)
VLDL Cholesterol Cal: 15 mg/dL (ref 5–40)
WBC: 2 10*3/uL — CL (ref 3.4–10.8)
eGFR: 80 mL/min/{1.73_m2} (ref >59)

## 2023-03-18 ENCOUNTER — Ambulatory Visit: Payer: Self-pay | Admitting: Physician Assistant

## 2023-03-18 ENCOUNTER — Encounter: Payer: Self-pay | Admitting: Physician Assistant

## 2023-03-18 VITALS — BP 121/66 | HR 62 | Temp 97.8°F | Resp 14 | Ht 66.0 in | Wt 188.0 lb

## 2023-03-18 DIAGNOSIS — Z0000 Encounter for general adult medical examination without abnormal findings: Secondary | ICD-10-CM

## 2023-03-18 LAB — POCT URINALYSIS DIPSTICK
Bilirubin, UA: NEGATIVE
Blood, UA: NEGATIVE
Glucose, UA: NEGATIVE
Ketones, UA: NEGATIVE
Nitrite, UA: NEGATIVE
Protein, UA: POSITIVE — AB
Spec Grav, UA: 1.02 (ref 1.010–1.025)
Urobilinogen, UA: 2 U/dL — AB
pH, UA: 6 (ref 5.0–8.0)

## 2023-03-18 NOTE — Progress Notes (Signed)
[facility] ____________________________________________   None    (approximate)  I have reviewed the triage vital signs and the nursing notes.   HISTORY  Chief Complaint No chief complaint on file.   HPI Ryan Willis. is a 62 y.o. male presents for annual physical exam.  Patient was no concerning complaints.         Past Medical History:  Diagnosis Date   Cirrhosis of liver (HCC)    Diverticulitis    Esophageal varices (HCC)    Family history of adverse reaction to anesthesia    MOTHER HAD N/V   History of kidney stones    Hypercholesteremia    Hypothyroidism    Peyronie's disease    Renal cyst, left    Seasonal allergies    Thrombocytopenia [facility])     Patient Active Problem List   Diagnosis Date Noted   Esophageal varices determined by endoscopy (HCC)    S/P cholecystectomy 04/26/2019   Cholecystitis 04/19/2019   Acute cholecystitis 01/25/2019   Splenomegaly 07/07/2017   Thrombocytopenia (HCC) 06/23/2017   Cirrhosis (HCC) 06/23/2017    Past Surgical History:  Procedure Laterality Date   CHOLECYSTECTOMY     COLONOSCOPY  2006   COLONOSCOPY WITH PROPOFOL N/A 09/08/2017   Procedure: COLONOSCOPY WITH PROPOFOL;  Surgeon: Wyline Mood, MD;  Location: [facility] ENDOSCOPY;  Service: Gastroenterology;  Laterality: N/A;   COLONOSCOPY WITH PROPOFOL N/A 01/01/2018   Procedure: COLONOSCOPY WITH PROPOFOL;  Surgeon: Wyline Mood, MD;  Location: [facility] ENDOSCOPY;  Service: Gastroenterology;  Laterality: N/A;   CYSTOSCOPY/URETEROSCOPY/HOLMIUM LASER/STENT PLACEMENT Right 01/17/2019   Procedure: CYSTOSCOPY/URETEROSCOPY/STENT PLACEMENT;  Surgeon: Vanna Scotland, MD;  Location: [facility] ORS;  Service: Urology;  Laterality: Right;   CYSTOSCOPY/URETEROSCOPY/HOLMIUM LASER/STENT PLACEMENT Right 01/31/2019   Procedure: CYSTOSCOPY/URETEROSCOPY/HOLMIUM LASER/STENT EXCHANGE;  Surgeon: Vanna Scotland, MD;  Location: [facility] ORS;  Service: Urology;  Laterality:  Right;   CYSTOSCOPY/URETEROSCOPY/HOLMIUM LASER/STENT PLACEMENT Left 08/13/2020   Procedure: CYSTOSCOPY/URETEROSCOPY/HOLMIUM LASER/STENT PLACEMENT;  Surgeon: Vanna Scotland, MD;  Location: [facility] ORS;  Service: Urology;  Laterality: Left;   ESOPHAGOGASTRODUODENOSCOPY (EGD) WITH PROPOFOL N/A 09/08/2017   Procedure: ESOPHAGOGASTRODUODENOSCOPY (EGD) WITH PROPOFOL;  Surgeon: Wyline Mood, MD;  Location: [facility] ENDOSCOPY;  Service: Gastroenterology;  Laterality: N/A;   ESOPHAGOGASTRODUODENOSCOPY (EGD) WITH PROPOFOL N/A 05/31/2020   Procedure: ESOPHAGOGASTRODUODENOSCOPY (EGD) WITH PROPOFOL;  Surgeon: Wyline Mood, MD;  Location: [facility] ENDOSCOPY;  Service: Gastroenterology;  Laterality: N/A;   ESOPHAGOGASTRODUODENOSCOPY (EGD) WITH PROPOFOL N/A 07/05/2021   Procedure: ESOPHAGOGASTRODUODENOSCOPY (EGD) WITH PROPOFOL;  Surgeon: Wyline Mood, MD;  Location: [facility] ENDOSCOPY;  Service: Gastroenterology;  Laterality: N/A;  WIFE WILL BE DRIVER: WILL BE 20 - 30 MINUTES AWAY   ESOPHAGOGASTRODUODENOSCOPY (EGD) WITH PROPOFOL N/A 08/08/2021   Procedure: ESOPHAGOGASTRODUODENOSCOPY (EGD) WITH PROPOFOL;  Surgeon: Wyline Mood, MD;  Location: [facility] ENDOSCOPY;  Service: Gastroenterology;  Laterality: N/A;   ESOPHAGOGASTRODUODENOSCOPY (EGD) WITH PROPOFOL N/A 01/02/2022   Procedure: ESOPHAGOGASTRODUODENOSCOPY (EGD) WITH PROPOFOL;  Surgeon: Wyline Mood, MD;  Location: [facility] ENDOSCOPY;  Service: Gastroenterology;  Laterality: N/A;   ESOPHAGOGASTRODUODENOSCOPY (EGD) WITH PROPOFOL N/A 10/10/2022   Procedure: ESOPHAGOGASTRODUODENOSCOPY (EGD) WITH PROPOFOL;  Surgeon: Wyline Mood, MD;  Location: [facility] ENDOSCOPY;  Service: Gastroenterology;  Laterality: N/A;   HERNIA REPAIR Right 1981   inguinal   INTRAOPERATIVE CHOLANGIOGRAM N/A 04/19/2019   Procedure: INTRAOPERATIVE CHOLANGIOGRAM;  Surgeon: Leafy Ro, MD;  Location: [facility] ORS;  Service: General;  Laterality: N/A;   SHOULDER SURGERY Right 2001   ROTATOR CUFF REPAIR   VENTRAL HERNIA REPAIR N/A  10/27/2019  Procedure: HERNIA REPAIR VENTRAL ADULT, Open;  Surgeon: Leafy Ro, MD;  Location: [facility] ORS;  Service: General;  Laterality: N/A;    Prior to Admission medications   Medication Sig Start Date End Date Taking? Authorizing Provider  lactulose (CHRONULAC) 10 GM/15ML solution Take by mouth 3 (three) times daily as needed for mild constipation.    [provider]  Multiple Vitamin (MULTIVITAMIN WITH MINERALS) TABS tablet Take 1 tablet by mouth daily.    [provider]  nadolol (CORGARD) 20 MG tablet TAKE 1 TABLET(20 MG) BY MOUTH DAILY 05/23/22   Wyline Mood, MD    Allergies Patient has no known allergies.  Family History  Problem Relation Age of Onset   Kidney cancer Mother    Lung cancer Mother    Colon cancer Father     Social History Social History   Tobacco Use   Smoking status: Never   Smokeless tobacco: Never  Vaping Use   Vaping status: Never Used  Substance Use Topics   Alcohol use: Not Currently   Drug use: No    Review of Systems  Constitutional: No fever/chills Eyes: No visual changes. ENT: No sore throat. Cardiovascular: Denies chest pain. Respiratory: Denies shortness of breath. Gastrointestinal: No abdominal pain.  No nausea, no vomiting.  No diarrhea.  No constipation.  Cirrhosis Genitourinary: Negative for dysuria. Musculoskeletal: Negative for back pain. Skin: Negative for rash. Neurological: Negative for headaches, focal weakness or numbness.  ____________________________________________   PHYSICAL EXAM:  VITAL SIGNS: BP 121/66  Pulse 62  Resp 14  Temp 97.8 F (36.6 C)  Temp src Temporal  SpO2 95 %  Weight 188 lb (85.3 kg)  Height 5\' 6"  (1.676 m)   BMI 30.34 kg/m2  BSA 1.99 m2   Constitutional: Alert and oriented. Well appearing and in no acute distress. Eyes: Conjunctivae are normal. PERRL. EOMI. Head: Atraumatic. Nose: No congestion/rhinnorhea. Mouth/Throat: Mucous membranes are moist.  Oropharynx  non-erythematous. Neck: No stridor.  No cervical spine tenderness to palpation. Hematological/Lymphatic/Immunilogical: No cervical lymphadenopathy. Cardiovascular: Normal rate, regular rhythm. Grossly normal heart sounds.  Good peripheral circulation. Respiratory: Normal respiratory effort.  No retractions. Lungs CTAB. Gastrointestinal: Soft and nontender. No distention. No abdominal bruits. No CVA tenderness. Genitourinary: Deferred Musculoskeletal: No lower extremity tenderness nor edema.  No joint effusions. Neurologic:  Normal speech and language. No gross focal neurologic deficits are appreciated. No gait instability. Skin:  Skin is warm, dry and intact. No rash noted. Psychiatric: Mood and affect are normal. Speech and behavior are normal.  ____________________________________________   LABS              Component Ref Range & Units 7 d ago (03/11/23) 5 mo ago (09/30/22) 5 mo ago (09/30/22) 5 mo ago (09/30/22) 9 mo ago (05/30/22) 2 yr ago (08/24/20) 2 yr ago (07/14/20) 2 yr ago (07/14/20) 2 yr ago (07/14/20)  Glucose 70 - 99 mg/dL 91 89 CM   98 95 R   119 High  CM  Uric Acid 3.8 - 8.4 mg/dL 4.3    5.2 CM 4.7 CM     Comment:            Therapeutic target for gout patients: <6.0  BUN 8 - 27 mg/dL 12 12 R   15 10   19  R  Creatinine, Ser 0.76 - 1.27 mg/dL 1.47 8.29 R   5.62 1.30   1.03 R  eGFR >59 mL/min/1.73 80    90 98     BUN/Creatinine Ratio  10 - 24 11    16 11      Sodium 134 - 144 mmol/L 144 136 R   147 High  140   140 R  Potassium 3.5 - 5.2 mmol/L 4.9 4.2 R   5.4 High  4.4   4.6 R  Chloride 96 - 106 mmol/L 107 High  108 R   111 High  105   112 High  R  Calcium 8.6 - 10.2 mg/dL 9.4 8.7 Low  R   9.6 9.3   9.1 R  Phosphorus 2.8 - 4.1 mg/dL 2.9    3.1 3.2     Total Protein 6.0 - 8.5 g/dL 6.5 6.7 R   6.6 6.8 6.4 Low  R    Albumin 3.9 - 4.9 g/dL 3.9 3.8 R   4.0 4.2 R 3.8 R    Globulin, Total 1.5 - 4.5 g/dL 2.6    2.6 2.6     Bilirubin Total 0.0 - 1.2 mg/dL 1.4 High  2.3  High  R   1.8 High  1.7 High  1.5 High  R    Alkaline Phosphatase 44 - 121 IU/L 103 64 R   81 78 67 R    LDH 121 - 224 IU/L 179    155 147     AST 0 - 40 IU/L 35 32 R   28 28 37 R    ALT 0 - 44 IU/L 23 22 R   20 16 23  R    GGT 0 - 65 IU/L 25    25 27      Iron 38 - 169 ug/dL 81    454 098 High      Cholesterol, Total 100 - 199 mg/dL 119    147 829     Triglycerides 0 - 149 mg/dL 69    73 83     HDL >56 mg/dL 47    45 45     VLDL Cholesterol Cal 5 - 40 mg/dL 15    15 16      LDL Chol Calc (NIH) 0 - 99 mg/dL 49    57 66     Chol/HDL Ratio 0.0 - 5.0 ratio 2.4    2.6 CM 2.8 CM     Comment:                                   T. Chol/HDL Ratio                                             Men  Women                               1/2 Avg.Risk  3.4    3.3                                   Avg.Risk  5.0    4.4                                2X Avg.Risk  9.6    7.1  3X Avg.Risk 23.4   11.0  Estimated CHD Risk 0.0 - 1.0 times avg.  < 0.5     < 0.5 CM  < 0.5 CM     Comment: The CHD Risk is based on the T. Chol/HDL ratio. Other factors affect CHD Risk such as hypertension, smoking, diabetes, severe obesity, and family history of premature CHD.  TSH 0.450 - 4.500 uIU/mL 3.090    2.920 3.440     T4, Total 4.5 - 12.0 ug/dL 7.9    7.7 8.9     T3 Uptake Ratio 24 - 39 % 28    26 26      Free Thyroxine Index 1.2 - 4.9 2.2    2.0 2.3     Prostate Specific Ag, Serum 0.0 - 4.0 ng/mL 1.5    1.5 CM 1.2 CM     Comment: Roche ECLIA methodology. According to the American Urological Association, Serum PSA should decrease and remain at undetectable levels after radical prostatectomy. The AUA defines biochemical recurrence as an initial PSA value 0.2 ng/mL or greater followed by a subsequent confirmatory PSA value 0.2 ng/mL or greater. Values obtained with different assay methods or kits cannot be used interchangeably. Results cannot be interpreted as absolute  evidence of the presence or absence of malignant disease.  WBC 3.4 - 10.8 x10E3/uL 2.0 Low Panic   2.4 Low  R 2.3 Low Panic  2.3 Low Panic  2.6 Low   2.7 Low  R   RBC 4.14 - 5.80 x10E6/uL 3.91 Low   3.97 Low  R 3.91 Low  4.08 Low  CM 3.79 Low   3.83 Low  R   Hemoglobin 13.0 - 17.7 g/dL 09.8  11.9 R 14.7 Low  13.3 13.1  12.9 Low  R   Hematocrit 37.5 - 51.0 % 39.0  38.8 Low  R 37.2 Low  38.7 36.4 Low   37.3 Low  R   MCV 79 - 97 fL 100 High   97.7 R 95 95 96  97.4 R   MCH 26.6 - 33.0 pg 33.5 High   33.2 R 32.7 32.6 34.6 High   33.7 R   MCHC 31.5 - 35.7 g/dL 82.9  56.2 R 13.0 86.5 36.0 High   34.6 R   RDW 11.6 - 15.4 % 13.4  13.9 R 13.3 13.1 13.0  13.7 R   Platelets 150 - 450 x10E3/uL 52 Low Panic   50 Low  R, CM 49 Low Panic  CM 47 Low Panic  CM 64 Low Panic  CM  61 Low  R, CM   Neutrophils Not Estab. % 58  58 R 55 57 62     Lymphs Not Estab. % 29   31 28 24      Monocytes Not Estab. % 9   10 10 9      Eos Not Estab. % 3   3 4 4      Basos Not Estab. % 1   1 1 1      Neutrophils Absolute 1.4 - 7.0 x10E3/uL 1.2 Low   1.4 Low  R 1.2 Low  1.3 Low  1.6     Lymphocytes Absolute 0.7 - 3.1 x10E3/uL 0.6 Low   0.7 R 0.7 0.6 Low  0.6 Low      Monocytes Absolute 0.1 - 0.9 x10E3/uL 0.2   0.2 0.2 0.2     EOS (ABSOLUTE) 0.0 - 0.4 x10E3/uL 0.1   0.1 0.1 0.1     Basophils Absolute 0.0 -  0.2 x10E3/uL 0.0  0.0 R 0.0 0.0 0.0     Immature Granulocytes Not Estab. % 0  0 R 0 0 0     Immature Grans (Abs) 0.0 - 0.1 x10E3/uL 0.0   0.0 0.0 0.0     Hematology Comments: Note:   Note: CM Note: CM Note: CM     Comment: Verified by microscopic examination.  Resulting Agency [facility]                    ____________________________________________  EKG  Sinus bradycardia 56 bpm ____________________________________________    ____________________________________________   INITIAL IMPRESSION / ASSESSMENT  AND PLAN  As part of my medical decision making, I reviewed the following data within the electronic MEDICAL RECORD NUMBER      No acute findings on physical exam or EKG.  Labs continues to show thrombocytopenia.  Follow-up with hematology per schedule.      ____________________________________________   FINAL CLINICAL IMPRESSION Well exam   ED Discharge Orders     None        Note:  This document was prepared using Dragon voice recognition software and may include unintentional dictation errors.

## 2023-03-18 NOTE — Progress Notes (Signed)
Pt presents today to complete physical, Pt denies any issues or concerns at this time/CL,RMA 

## 2023-03-19 DIAGNOSIS — D696 Thrombocytopenia, unspecified: Secondary | ICD-10-CM | POA: Diagnosis not present

## 2023-03-23 ENCOUNTER — Encounter: Payer: Self-pay | Admitting: Internal Medicine

## 2023-04-01 ENCOUNTER — Inpatient Hospital Stay: Payer: 59 | Attending: Internal Medicine | Admitting: Internal Medicine

## 2023-04-01 VITALS — BP 126/69 | HR 55 | Temp 97.2°F | Resp 16 | Wt 195.0 lb

## 2023-04-01 DIAGNOSIS — R161 Splenomegaly, not elsewhere classified: Secondary | ICD-10-CM | POA: Diagnosis not present

## 2023-04-01 DIAGNOSIS — K7460 Unspecified cirrhosis of liver: Secondary | ICD-10-CM | POA: Insufficient documentation

## 2023-04-01 DIAGNOSIS — D696 Thrombocytopenia, unspecified: Secondary | ICD-10-CM

## 2023-04-01 NOTE — Progress Notes (Signed)
Patient states that he is doing well, and he has no new questions or concerns for the doctor today.

## 2023-04-01 NOTE — Progress Notes (Signed)
[facility] CONSULT NOTE  Patient Care Team: Patient, No Pcp Per as PCP - General (General Practice) Earna Coder, MD as Consulting Physician (Oncology)  CHIEF COMPLAINTS/PURPOSE OF CONSULTATION: Thrombocytopenia  # THROMBOCYTOPENIA [97-108; incidental]; Hep B/hepC/HIV-NEG [[facility]] sec to cirrhosis/ splenomegaly  # Cirrhosis/ SPLENOMEGALY[US- 1000cc; CT A/P march 2019] s/p EGD/colo [may 2019- Dr.Anna]  #October 2020-kidney stones s/p stenting [Dr. Apolinar Junes; gallstone/cholecystitis-s/p cholecystectomy December 2020]  # Peyronie disease-   Oncology History   No history exists.   HISTORY OF PRESENTING ILLNESS: Ambulating independently.  Alone.  Ryan Willis. 62 y.o.  male cirrhosis/splenomegaly and thrombocytopenia is here for follow-up/ review of labs [[facility]].  Patient states that he is doing well, and he has no new questions or concerns for the doctor today.    Energy not as good. Pt has a good appetite.  Bruises easily. No visible blood from anywhere. Denies any blood in stools or black-colored stools.  No gum bleeding no nosebleeds.  Review of Systems  Constitutional:  Positive for weight loss. Negative for chills, diaphoresis, fever and malaise/fatigue.  HENT:  Negative for nosebleeds and sore throat.   Eyes:  Negative for double vision.  Respiratory:  Negative for cough, hemoptysis, sputum production, shortness of breath and wheezing.   Cardiovascular:  Negative for chest pain, palpitations, orthopnea and leg swelling.  Gastrointestinal:  Negative for abdominal pain, blood in stool, constipation, diarrhea, heartburn, melena, nausea and vomiting.  Genitourinary:  Negative for dysuria, frequency and urgency.  Musculoskeletal:  Negative for back pain and joint pain.  Skin: Negative.  Negative for itching and rash.  Neurological:  Negative for dizziness, tingling, focal weakness, weakness and headaches.  Endo/Heme/Allergies:  Bruises/bleeds easily.   Psychiatric/Behavioral:  Negative for depression. The patient is not nervous/anxious and does not have insomnia.     MEDICAL HISTORY:  Past Medical History:  Diagnosis Date   Cirrhosis of liver (HCC)    Diverticulitis    Esophageal varices (HCC)    Family history of adverse reaction to anesthesia    MOTHER HAD N/V   History of kidney stones    Hypercholesteremia    Hypothyroidism    Peyronie's disease    Renal cyst, left    Seasonal allergies    Thrombocytopenia (HCC)     SURGICAL HISTORY: Past Surgical History:  Procedure Laterality Date   CHOLECYSTECTOMY     COLONOSCOPY  2006   COLONOSCOPY WITH PROPOFOL N/A 09/08/2017   Procedure: COLONOSCOPY WITH PROPOFOL;  Surgeon: Wyline Mood, MD;  Location: [facility] ENDOSCOPY;  Service: Gastroenterology;  Laterality: N/A;   COLONOSCOPY WITH PROPOFOL N/A 01/01/2018   Procedure: COLONOSCOPY WITH PROPOFOL;  Surgeon: Wyline Mood, MD;  Location: [facility] ENDOSCOPY;  Service: Gastroenterology;  Laterality: N/A;   CYSTOSCOPY/URETEROSCOPY/HOLMIUM LASER/STENT PLACEMENT Right 01/17/2019   Procedure: CYSTOSCOPY/URETEROSCOPY/STENT PLACEMENT;  Surgeon: Vanna Scotland, MD;  Location: [facility] ORS;  Service: Urology;  Laterality: Right;   CYSTOSCOPY/URETEROSCOPY/HOLMIUM LASER/STENT PLACEMENT Right 01/31/2019   Procedure: CYSTOSCOPY/URETEROSCOPY/HOLMIUM LASER/STENT EXCHANGE;  Surgeon: Vanna Scotland, MD;  Location: [facility] ORS;  Service: Urology;  Laterality: Right;   CYSTOSCOPY/URETEROSCOPY/HOLMIUM LASER/STENT PLACEMENT Left 08/13/2020   Procedure: CYSTOSCOPY/URETEROSCOPY/HOLMIUM LASER/STENT PLACEMENT;  Surgeon: Vanna Scotland, MD;  Location: [facility] ORS;  Service: Urology;  Laterality: Left;   ESOPHAGOGASTRODUODENOSCOPY (EGD) WITH PROPOFOL N/A 09/08/2017   Procedure: ESOPHAGOGASTRODUODENOSCOPY (EGD) WITH PROPOFOL;  Surgeon: Wyline Mood, MD;  Location: [facility] ENDOSCOPY;  Service: Gastroenterology;  Laterality: N/A;   ESOPHAGOGASTRODUODENOSCOPY (EGD) WITH PROPOFOL N/A 05/31/2020    Procedure: ESOPHAGOGASTRODUODENOSCOPY (EGD) WITH PROPOFOL;  Surgeon:  Wyline Mood, MD;  Location: [facility] ENDOSCOPY;  Service: Gastroenterology;  Laterality: N/A;   ESOPHAGOGASTRODUODENOSCOPY (EGD) WITH PROPOFOL N/A 07/05/2021   Procedure: ESOPHAGOGASTRODUODENOSCOPY (EGD) WITH PROPOFOL;  Surgeon: Wyline Mood, MD;  Location: [facility] ENDOSCOPY;  Service: Gastroenterology;  Laterality: N/A;  WIFE WILL BE DRIVER: WILL BE 20 - 30 MINUTES AWAY   ESOPHAGOGASTRODUODENOSCOPY (EGD) WITH PROPOFOL N/A 08/08/2021   Procedure: ESOPHAGOGASTRODUODENOSCOPY (EGD) WITH PROPOFOL;  Surgeon: Wyline Mood, MD;  Location: [facility] ENDOSCOPY;  Service: Gastroenterology;  Laterality: N/A;   ESOPHAGOGASTRODUODENOSCOPY (EGD) WITH PROPOFOL N/A 01/02/2022   Procedure: ESOPHAGOGASTRODUODENOSCOPY (EGD) WITH PROPOFOL;  Surgeon: Wyline Mood, MD;  Location: [facility] ENDOSCOPY;  Service: Gastroenterology;  Laterality: N/A;   ESOPHAGOGASTRODUODENOSCOPY (EGD) WITH PROPOFOL N/A 10/10/2022   Procedure: ESOPHAGOGASTRODUODENOSCOPY (EGD) WITH PROPOFOL;  Surgeon: Wyline Mood, MD;  Location: [facility] ENDOSCOPY;  Service: Gastroenterology;  Laterality: N/A;   HERNIA REPAIR Right 1981   inguinal   INTRAOPERATIVE CHOLANGIOGRAM N/A 04/19/2019   Procedure: INTRAOPERATIVE CHOLANGIOGRAM;  Surgeon: Leafy Ro, MD;  Location: [facility] ORS;  Service: General;  Laterality: N/A;   SHOULDER SURGERY Right 2001   ROTATOR CUFF REPAIR   VENTRAL HERNIA REPAIR N/A 10/27/2019   Procedure: HERNIA REPAIR VENTRAL ADULT, Open;  Surgeon: Leafy Ro, MD;  Location: [facility] ORS;  Service: General;  Laterality: N/A;    SOCIAL HISTORY:  Social History   Socioeconomic History   Marital status: Married    Spouse name: Not on file   Number of children: Not on file   Years of education: Not on file   Highest education level: Not on file  Occupational History   Not on file  Tobacco Use   Smoking status: Never   Smokeless tobacco: Never  Vaping Use   Vaping status: Never Used   Substance and Sexual Activity   Alcohol use: Not Currently   Drug use: No   Sexual activity: Not on file  Other Topics Concern   Not on file  Social History Narrative   retd. Emergency planning/management officer; part time information specialist; never smoked; no alcohol; live in [place]. Lives with wife.    Social Determinants of Health   Financial Resource Strain: Not on file  Food Insecurity: Not on file  Transportation Needs: Not on file  Physical Activity: Not on file  Stress: Not on file  Social Connections: Not on file  Intimate Partner Violence: Not on file    FAMILY HISTORY: colon father- 70 year; mother- kidney cancer; died of lung cancer [2018] Family History  Problem Relation Age of Onset   Kidney cancer Mother    Lung cancer Mother    Colon cancer Father     ALLERGIES:  has No Known Allergies.  MEDICATIONS:  Current Outpatient Medications  Medication Sig Dispense Refill   Creatine POWD Take 5 g by mouth daily.     lactulose (CHRONULAC) 10 GM/15ML solution Take by mouth 3 (three) times daily as needed for mild constipation.     Multiple Vitamin (MULTIVITAMIN WITH MINERALS) TABS tablet Take 1 tablet by mouth daily.     nadolol (CORGARD) 20 MG tablet TAKE 1 TABLET(20 MG) BY MOUTH DAILY 90 tablet 3   No current facility-administered medications for this visit.   PHYSICAL EXAMINATION: ECOG PERFORMANCE STATUS: 0 - Asymptomatic  Vitals:   04/01/23 1352  BP: 126/69  Pulse: (!) 55  Resp: 16  Temp: (!) 97.2 F (36.2 C)  SpO2: 99%    Filed Weights   04/01/23 1352  Weight: 195 lb (88.5 kg)  Physical Exam HENT:     Head: Normocephalic and atraumatic.     Mouth/Throat:     Pharynx: No oropharyngeal exudate.  Eyes:     Pupils: Pupils are equal, round, and reactive to light.  Cardiovascular:     Rate and Rhythm: Normal rate and regular rhythm.  Pulmonary:     Effort: No respiratory distress.     Breath sounds: No wheezing.  Abdominal:     General: Bowel sounds are  normal. There is no distension.     Palpations: Abdomen is soft. There is no mass.     Tenderness: There is no abdominal tenderness. There is no guarding or rebound.  Musculoskeletal:        General: No tenderness. Normal range of motion.     Cervical back: Normal range of motion and neck supple.  Skin:    General: Skin is warm.  Neurological:     Mental Status: He is alert and oriented to person, place, and time.  Psychiatric:        Mood and Affect: Affect normal.      LABORATORY DATA:  I have reviewed the data as listed Lab Results  Component Value Date   WBC 2.0 (LL) 03/11/2023   HGB 13.1 03/11/2023   HCT 39.0 03/11/2023   MCV 100 (H) 03/11/2023   PLT 52 (LL) 03/11/2023   Recent Labs    05/30/22 0828 09/30/22 1346 03/11/23 0822  NA 147* 136 144  K 5.4* 4.2 4.9  CL 111* 108 107*  CO2  --  23  --   GLUCOSE 98 89 91  BUN 15 12 12   CREATININE 0.95 0.77 1.05  CALCIUM 9.6 8.7* 9.4  GFRNONAA  --  >60  --   PROT 6.6 6.7 6.5  ALBUMIN 4.0 3.8 3.9  AST 28 32 35  ALT 20 22 23   ALKPHOS 81 64 103  BILITOT 1.8* 2.3* 1.4*     RADIOGRAPHIC STUDIES: I have personally reviewed the radiological images as listed and agreed with the findings in the report. No results found.  ASSESSMENT & PLAN:   Thrombocytopenia (HCC) #Leucopenia/ Thrombocytopenia/splenomegaly-Hypersplenism- [January 2019 platelets- between 106-97]-  # 2024-WBC- 2.2/ANC1.6 Hb -13 platelets 52 Trending down but overall clinically stable. Asymptomatic.  Continue surveillance. AFP- pending.   # Cirrhosis/splenomegaly [May 2020]  [Dr.Anna] [n-8.3]; intermittently elevated; monitor for now; EGD- SEP 2023- s/p banding. MRI AUG 2023- No enhancing lesion in the liver. No evidence of hepatocellular carcinoma. Portal hypertension with paraesophageal varices and splenomegaly. No ascites-. [place] in June 2024 [Dr.Anna]- No concerns of HCC.    # DISPOSITION:  # follow up in in 6 months MD, labs -cbc/cmp/AFP/PT/PTT  [[facility]]; - Dr.B   All questions were answered. The patient knows to call the clinic with any problems, questions or concerns.    Earna Coder, MD 04/01/2023 2:26 PM

## 2023-04-01 NOTE — Assessment & Plan Note (Addendum)
#  Leucopenia/ Thrombocytopenia/splenomegaly-Hypersplenism- [January 2019 platelets- between 106-97]-  # 2024-WBC- 2.2/ANC1.6 Hb -13 platelets 52 Trending down but overall clinically stable. Asymptomatic.  Continue surveillance. AFP- pending.   # Cirrhosis/splenomegaly [May 2020]  [Dr.Anna] [n-8.3]; intermittently elevated; monitor for now; EGD- SEP 2023- s/p banding. MRI AUG 2023- No enhancing lesion in the liver. No evidence of hepatocellular carcinoma. Portal hypertension with paraesophageal varices and splenomegaly. No ascites-. [place] in June 2024 [Dr.Anna]- No concerns of HCC.    # DISPOSITION:  # follow up in in 6 months MD, labs -cbc/cmp/AFP/PT/PTT [[facility]]; - Dr.B

## 2023-04-02 ENCOUNTER — Encounter: Payer: Self-pay | Admitting: Gastroenterology

## 2023-04-02 ENCOUNTER — Ambulatory Visit: Payer: 59 | Admitting: Gastroenterology

## 2023-04-02 VITALS — BP 126/76 | HR 53 | Temp 98.3°F | Wt 196.0 lb

## 2023-04-02 DIAGNOSIS — I8500 Esophageal varices without bleeding: Secondary | ICD-10-CM

## 2023-04-02 DIAGNOSIS — I8510 Secondary esophageal varices without bleeding: Secondary | ICD-10-CM

## 2023-04-02 DIAGNOSIS — K7460 Unspecified cirrhosis of liver: Secondary | ICD-10-CM

## 2023-04-02 NOTE — Patient Instructions (Addendum)
Please arrive at the [facility] outpatient [facility] (Address: [address] [place], [place] [address] Phone: [phone] 8:45 AM.      Please do not eat or drink after midnight the night before.

## 2023-04-02 NOTE — Progress Notes (Signed)
Wyline Mood MD, MRCP(U.K) [address]  [address]  [place], [place] [address]  Main: [phone]  Fax: [phone]   Primary Care Physician: Patient, No Pcp Per  Primary Gastroenterologist:  Dr. Wyline Mood   Chief Complaint  Patient presents with   Cirrhosis    Esophageal varices    HPI: Ryan Willis. is a 62 y.o. male  Summary of history :   H/o  liver cirrhosis secondary to NAFLD with non bleeding esophageal varices grade 1     CT scan on 07/15/17 which showed features of cirrhosis and portal hypertension with with splenomegaly, a recanalized left paraumbilical vein and small distal esophageal varices. His dad had colon cancer..    01/01/18 : colonoscopy- 3 mm tubular adenoma resected  03/2020 : Underwent cholecystectomy  03/19/2020 seen Dr. Donneta Romberg for thrombocytopenia.   04/23/2020: CT abdomen and pelvis: stable features of cirrhosis , moderate stool burden .  05/31/2020: EGD: Grade 1 esophageal varices. Commenced on 20 mg Nadolol.  09/16/2021: Hemoglobin 13.1 creatinine 0.75 INR 1.2 and bilirubin 1.0 normal transaminases.  AFP 9.8. 08/08/2021: Grade 2 esophageal varices seen 2 bands placed.  Recommended repeat upper endoscopy in 4 weeks. 11/2021: EGD: Varices were noted and they were banded repeat endoscopy in 4 weeks is recommended.   12/15/2021 MRI liver shows hepatic cirrhosis no evidence OF HCC.   09/17/2022: Hemoglobin 12.8 g platelet count 44 chloride 110 creatinine 0.94, INR 1.3 AFP elevated at 9.3 stable    Interval history 09/30/2022-04/02/2023   10/10/2022: EGD: small grade 1 esophageal varices seen  10/06/2022: RUQ USG cirrhosis no liver mass  03/19/2023 hemoglobin 12.5 g MCV of 98 creatinine of 1.1 INR 1.3 AFP of 7.3 platelets of 44.    NSAID's: no  Low Salt Diet :  Yes Sleep pattern/confusion/memory issues: None Bowel  movements : Having regular bowel movements and if he does have episodes of constipation he takes his lactulose Heart rate around 55  bpm No other complaints  MELD score of 9 in November 2024   Current Outpatient Medications  Medication Sig Dispense Refill   Creatine POWD Take 5 g by mouth daily.     lactulose (CHRONULAC) 10 GM/15ML solution Take by mouth 3 (three) times daily as needed for mild constipation.     Multiple Vitamin (MULTIVITAMIN WITH MINERALS) TABS tablet Take 1 tablet by mouth daily.     nadolol (CORGARD) 20 MG tablet TAKE 1 TABLET(20 MG) BY MOUTH DAILY 90 tablet 3   No current facility-administered medications for this visit.    Allergies as of 04/02/2023   (No Known Allergies)     ROS:  General: Negative for anorexia, weight loss, fever, chills, fatigue, weakness. ENT: Negative for hoarseness, difficulty swallowing , nasal congestion. CV: Negative for chest pain, angina, palpitations, dyspnea on exertion, peripheral edema.  Respiratory: Negative for dyspnea at rest, dyspnea on exertion, cough, sputum, wheezing.  GI: See history of present illness. GU:  Negative for dysuria, hematuria, urinary incontinence, urinary frequency, nocturnal urination.  Endo: Negative for unusual weight change.    Physical Examination:   BP 126/76   Pulse (!) 53   Temp 98.3 F (36.8 C) (Oral)   Wt 196 lb (88.9 kg)   BMI 31.64 kg/m   General: Well-nourished, well-developed in no acute distress.  Eyes: No icterus. Conjunctivae pink. Mouth: Oropharyngeal mucosa moist and pink , no lesions erythema or exudate. Neuro: Alert and oriented x 3.  Grossly intact. Skin: Warm and dry, no  jaundice.   Psych: Alert and cooperative, normal mood and affect.   Imaging Studies: No results found.  Assessment and Plan:   Ryan Willis. is a 62 y.o. y/o male  here to follow-up for liver cirrhosis, compensated, non bleeding esophageal varices secondary to NAFLD.  He is compensated and immune to hepatitis a and B.  No recent history of hepatic encephalopathy.  Meld score is 9 in November 2024     Plan 1.  Continue  nadolol heart rate is 55 bpm on 20 mg a day take 2.  Low-salt diet, avoid NSAID's 3.  Right upper quadrant ultrasound to screen for [facility] in 03/2023 4.  EGD in 1 year June 2025 5.  Lactulose : As needed when he is constipated Refused vaccination today and in the past    Dr Wyline Mood  MD,MRCP [facility]) Follow up in 4 to 5 months

## 2023-04-07 ENCOUNTER — Ambulatory Visit
Admission: RE | Admit: 2023-04-07 | Discharge: 2023-04-07 | Disposition: A | Discharge status: Home / Self Care | Payer: 59 | Source: Ambulatory Visit | Attending: Gastroenterology | Admitting: Gastroenterology

## 2023-04-07 DIAGNOSIS — K7689 Other specified diseases of liver: Secondary | ICD-10-CM | POA: Diagnosis not present

## 2023-04-07 DIAGNOSIS — I8500 Esophageal varices without bleeding: Secondary | ICD-10-CM | POA: Diagnosis not present

## 2023-04-07 DIAGNOSIS — Z9049 Acquired absence of other specified parts of digestive tract: Secondary | ICD-10-CM | POA: Diagnosis not present

## 2023-05-18 ENCOUNTER — Other Ambulatory Visit: Payer: Self-pay

## 2023-05-18 MED ORDER — NADOLOL 20 MG PO TABS: ORAL_TABLET | ORAL | 3 refills | Status: DC

## 2023-08-03 ENCOUNTER — Telehealth: Payer: Self-pay | Admitting: Gastroenterology

## 2023-08-03 DIAGNOSIS — K7460 Unspecified cirrhosis of liver: Secondary | ICD-10-CM

## 2023-08-03 DIAGNOSIS — I8500 Esophageal varices without bleeding: Secondary | ICD-10-CM

## 2023-08-03 NOTE — Telephone Encounter (Signed)
**Note De-identified  [place] Obfuscation** Please advise 

## 2023-08-03 NOTE — Telephone Encounter (Signed)
 Dr. Tobi Bastos, you had seen patient on 03/2023 and you had stated to follow up with him within 4-5 months. He has cirrhosis and on his last EGD, you had stated that he was due for one until June 2025. However, you are not here. Patient would like to know if he needs an appointment scheduled with you and then schedule an EGD before you leave or just schedule an EGD or wait until another provider comes to our office and placed in our recall list. Please advise.

## 2023-08-03 NOTE — Telephone Encounter (Signed)
 The patient called requesting to schedule an appointment with Dr. Tobi Bastos. He mentioned that he may need an EGD scheduled for June and that he is typically seen a few weeks prior to his procedure. I consulted with the nurse and informed the patient that we would need to discuss the matter with Dr. Tobi Bastos. The nurse will call the patient back to determine the next steps regarding the appointment and procedure

## 2023-08-04 NOTE — Addendum Note (Signed)
 Addended by: Adela Ports on: 08/04/2023 11:49 AM   Modules accepted: Orders

## 2023-08-04 NOTE — Telephone Encounter (Signed)
 Called patient back and let him know the below information and he agreed with the plan of getting labs drawn in May so you could review and then he will call [facility] to schedule an EGD with you.

## 2023-09-17 DIAGNOSIS — I8500 Esophageal varices without bleeding: Secondary | ICD-10-CM | POA: Diagnosis not present

## 2023-09-17 DIAGNOSIS — K7460 Unspecified cirrhosis of liver: Secondary | ICD-10-CM | POA: Diagnosis not present

## 2023-09-18 LAB — COMPREHENSIVE METABOLIC PANEL WITH GFR
ALT: 21 IU/L (ref 0–44)
AST: 34 IU/L (ref 0–40)
Albumin: 3.7 g/dL — ABNORMAL LOW (ref 3.9–4.9)
Alkaline Phosphatase: 90 IU/L (ref 44–121)
BUN/Creatinine Ratio: 9 — ABNORMAL LOW (ref 10–24)
BUN: 10 mg/dL (ref 8–27)
Bilirubin Total: 1.9 mg/dL — ABNORMAL HIGH (ref 0.0–1.2)
CO2: 21 mmol/L (ref 20–29)
Calcium: 8.7 mg/dL (ref 8.6–10.2)
Chloride: 110 mmol/L — ABNORMAL HIGH (ref 96–106)
Creatinine, Ser: 1.12 mg/dL (ref 0.76–1.27)
Globulin, Total: 2.5 g/dL (ref 1.5–4.5)
Glucose: 100 mg/dL — ABNORMAL HIGH (ref 70–99)
Potassium: 4.2 mmol/L (ref 3.5–5.2)
Sodium: 142 mmol/L (ref 134–144)
Total Protein: 6.2 g/dL (ref 6.0–8.5)
eGFR: 74 mL/min/{1.73_m2} (ref >59)

## 2023-09-18 LAB — PROTIME-INR
INR: 1.4 — ABNORMAL HIGH (ref 0.9–1.2)
Prothrombin Time: 15.1 s — ABNORMAL HIGH (ref 9.1–12.0)

## 2023-09-23 ENCOUNTER — Encounter: Payer: Self-pay | Admitting: Internal Medicine

## 2023-10-02 ENCOUNTER — Inpatient Hospital Stay: Payer: 59 | Attending: Internal Medicine | Admitting: Internal Medicine

## 2023-10-02 ENCOUNTER — Encounter: Payer: Self-pay | Admitting: Internal Medicine

## 2023-10-02 VITALS — BP 114/63 | HR 63 | Temp 97.8°F | Resp 18 | Wt 193.9 lb

## 2023-10-02 DIAGNOSIS — D696 Thrombocytopenia, unspecified: Secondary | ICD-10-CM | POA: Diagnosis not present

## 2023-10-02 DIAGNOSIS — R634 Abnormal weight loss: Secondary | ICD-10-CM | POA: Diagnosis not present

## 2023-10-02 DIAGNOSIS — Z9049 Acquired absence of other specified parts of digestive tract: Secondary | ICD-10-CM | POA: Insufficient documentation

## 2023-10-02 DIAGNOSIS — Z801 Family history of malignant neoplasm of trachea, bronchus and lung: Secondary | ICD-10-CM | POA: Insufficient documentation

## 2023-10-02 DIAGNOSIS — K7460 Unspecified cirrhosis of liver: Secondary | ICD-10-CM | POA: Insufficient documentation

## 2023-10-02 DIAGNOSIS — Z8051 Family history of malignant neoplasm of kidney: Secondary | ICD-10-CM | POA: Insufficient documentation

## 2023-10-02 DIAGNOSIS — Z800 Family history of malignant neoplasm of digestive organs: Secondary | ICD-10-CM | POA: Insufficient documentation

## 2023-10-02 DIAGNOSIS — R161 Splenomegaly, not elsewhere classified: Secondary | ICD-10-CM | POA: Insufficient documentation

## 2023-10-02 NOTE — Progress Notes (Signed)
 [facility] CONSULT NOTE  Patient Care Team: Patient, No Pcp Per as PCP - General (General Practice) Gwyn Leos, MD as Consulting Physician (Oncology)  CHIEF COMPLAINTS/PURPOSE OF CONSULTATION: Thrombocytopenia  # THROMBOCYTOPENIA [97-108; incidental]; Hep B/hepC/HIV-NEG [[facility]] sec to cirrhosis/ splenomegaly  # Cirrhosis/ SPLENOMEGALY[US - 1000cc; CT A/P march 2019] s/p EGD/colo [may 2019- Dr.Anna]  #October 2020-kidney stones s/p stenting [Dr. Ace Holder; gallstone/cholecystitis-s/p cholecystectomy December 2020]  # Peyronie disease-   Oncology History   No history exists.   HISTORY OF PRESENTING ILLNESS: Ambulating independently.  Alone.  Ryan Willis. 63 y.o.  male cirrhosis/splenomegaly and thrombocytopenia is here for follow-up/ review of labs [[facility]].  Patient states that he is doing well, and he has no new questions or concerns for the doctor today.  Bruises improved/Energy improved with creatinine.   Pt has a good appetite.  No visible blood from anywhere. Denies any blood in stools or black-colored stools.  Patient is currently awaiting EGD with Dr. Antony Baumgartner.   Review of Systems  Constitutional:  Positive for weight loss. Negative for chills, diaphoresis, fever and malaise/fatigue.  HENT:  Negative for nosebleeds and sore throat.   Eyes:  Negative for double vision.  Respiratory:  Negative for cough, hemoptysis, sputum production, shortness of breath and wheezing.   Cardiovascular:  Negative for chest pain, palpitations, orthopnea and leg swelling.  Gastrointestinal:  Negative for abdominal pain, blood in stool, constipation, diarrhea, heartburn, melena, nausea and vomiting.  Genitourinary:  Negative for dysuria, frequency and urgency.  Musculoskeletal:  Negative for back pain and joint pain.  Skin: Negative.  Negative for itching and rash.  Neurological:  Negative for dizziness, tingling, focal weakness, weakness and headaches.   Endo/Heme/Allergies:  Bruises/bleeds easily.  Psychiatric/Behavioral:  Negative for depression. The patient is not nervous/anxious and does not have insomnia.     MEDICAL HISTORY:  Past Medical History:  Diagnosis Date   Cirrhosis of liver (HCC)    Diverticulitis    Esophageal varices (HCC)    Family history of adverse reaction to anesthesia    MOTHER HAD N/V   History of kidney stones    Hypercholesteremia    Hypothyroidism    Peyronie's disease    Renal cyst, left    Seasonal allergies    Thrombocytopenia (HCC)     SURGICAL HISTORY: Past Surgical History:  Procedure Laterality Date   CHOLECYSTECTOMY     COLONOSCOPY  2006   COLONOSCOPY WITH PROPOFOL  N/A 09/08/2017   Procedure: COLONOSCOPY WITH PROPOFOL ;  Surgeon: Luke Salaam, MD;  Location: [facility] ENDOSCOPY;  Service: Gastroenterology;  Laterality: N/A;   COLONOSCOPY WITH PROPOFOL  N/A 01/01/2018   Procedure: COLONOSCOPY WITH PROPOFOL ;  Surgeon: Luke Salaam, MD;  Location[facility] ENDOSCOPY;  Service: Gastroenterology;  Laterality: N/A;   CYSTOSCOPY/URETEROSCOPY/HOLMIUM LASER/STENT PLACEMENT Right 01/17/2019   Procedure: CYSTOSCOPY/URETEROSCOPY/STENT PLACEMENT;  Surgeon: Dustin Gimenez, MD;  Location: [facility] ORS;  Service: Urology;  Laterality: Right;   CYSTOSCOPY/URETEROSCOPY/HOLMIUM LASER/STENT PLACEMENT Right 01/31/2019   Procedure: CYSTOSCOPY/URETEROSCOPY/HOLMIUM LASER/STENT EXCHANGE;  Surgeon: Dustin Gimenez, MD;  Location: [facility] ORS;  Service: Urology;  Laterality: Right;   CYSTOSCOPY/URETEROSCOPY/HOLMIUM LASER/STENT PLACEMENT Left 08/13/2020   Procedure: CYSTOSCOPY/URETEROSCOPY/HOLMIUM LASER/STENT PLACEMENT;  Surgeon: Dustin Gimenez, MD;  Location: [facility] ORS;  Service: Urology;  Laterality: Left;   ESOPHAGOGASTRODUODENOSCOPY (EGD) WITH PROPOFOL  N/A 09/08/2017   Procedure: ESOPHAGOGASTRODUODENOSCOPY (EGD) WITH PROPOFOL ;  Surgeon: Luke Salaam, MD;  Location: [facility] ENDOSCOPY;  Service: Gastroenterology;  Laterality: N/A;    ESOPHAGOGASTRODUODENOSCOPY (EGD) WITH PROPOFOL  N/A 05/31/2020   Procedure: ESOPHAGOGASTRODUODENOSCOPY (EGD) WITH  PROPOFOL ;  Surgeon: Luke Salaam, MD;  Location: [facility] ENDOSCOPY;  Service: Gastroenterology;  Laterality: N/A;   ESOPHAGOGASTRODUODENOSCOPY (EGD) WITH PROPOFOL  N/A 07/05/2021   Procedure: ESOPHAGOGASTRODUODENOSCOPY (EGD) WITH PROPOFOL ;  Surgeon: Luke Salaam, MD;  Location: [facility] ENDOSCOPY;  Service: Gastroenterology;  Laterality: N/A;  WIFE WILL BE DRIVER: WILL BE 20 - 30 MINUTES AWAY   ESOPHAGOGASTRODUODENOSCOPY (EGD) WITH PROPOFOL  N/A 08/08/2021   Procedure: ESOPHAGOGASTRODUODENOSCOPY (EGD) WITH PROPOFOL ;  Surgeon: Luke Salaam, MD;  Location: [facility] ENDOSCOPY;  Service: Gastroenterology;  Laterality: N/A;   ESOPHAGOGASTRODUODENOSCOPY (EGD) WITH PROPOFOL  N/A 01/02/2022   Procedure: ESOPHAGOGASTRODUODENOSCOPY (EGD) WITH PROPOFOL ;  Surgeon: Luke Salaam, MD;  Location: [facility] ENDOSCOPY;  Service: Gastroenterology;  Laterality: N/A;   ESOPHAGOGASTRODUODENOSCOPY (EGD) WITH PROPOFOL  N/A 10/10/2022   Procedure: ESOPHAGOGASTRODUODENOSCOPY (EGD) WITH PROPOFOL ;  Surgeon: Luke Salaam, MD;  Location: [facility] ENDOSCOPY;  Service: Gastroenterology;  Laterality: N/A;   HERNIA REPAIR Right 1981   inguinal   INTRAOPERATIVE CHOLANGIOGRAM N/A 04/19/2019   Procedure: INTRAOPERATIVE CHOLANGIOGRAM;  Surgeon: Alben Alma, MD;  Location: [facility] ORS;  Service: General;  Laterality: N/A;   SHOULDER SURGERY Right 2001   ROTATOR CUFF REPAIR   VENTRAL HERNIA REPAIR N/A 10/27/2019   Procedure: HERNIA REPAIR VENTRAL ADULT, Open;  Surgeon: Alben Alma, MD;  Location: [facility] ORS;  Service: General;  Laterality: N/A;    SOCIAL HISTORY:  Social History   Socioeconomic History   Marital status: Married    Spouse name: Not on file   Number of children: Not on file   Years of education: Not on file   Highest education level: Not on file  Occupational History   Not on file  Tobacco Use   Smoking status: Never   Smokeless  tobacco: Never  Vaping Use   Vaping status: Never Used  Substance and Sexual Activity   Alcohol use: Not Currently   Drug use: No   Sexual activity: Not on file  Other Topics Concern   Not on file  Social History Narrative   retd. Emergency planning/management officer; part time information specialist; never smoked; no alcohol; live in [place]. Lives with wife.    Social Drivers of Corporate investment banker Strain: Not on file  Food Insecurity: Not on file  Transportation Needs: Not on file  Physical Activity: Not on file  Stress: Not on file  Social Connections: Not on file  Intimate Partner Violence: Not on file    FAMILY HISTORY: colon father- 33 year; mother- kidney cancer; died of lung cancer [2018] Family History  Problem Relation Age of Onset   Kidney cancer Mother    Lung cancer Mother    Colon cancer Father     ALLERGIES:  has no known allergies.  MEDICATIONS:  Current Outpatient Medications  Medication Sig Dispense Refill   Creatine POWD Take 5 g by mouth daily.     lactulose  (CHRONULAC ) 10 GM/15ML solution Take by mouth 3 (three) times daily as needed for mild constipation.     Multiple Vitamin (MULTIVITAMIN WITH MINERALS) TABS tablet Take 1 tablet by mouth daily.     nadolol  (CORGARD ) 20 MG tablet TAKE 1 TABLET(20 MG) BY MOUTH DAILY 90 tablet 3   No current facility-administered medications for this visit.   PHYSICAL EXAMINATION: ECOG PERFORMANCE STATUS: 0 - Asymptomatic  Vitals:   10/02/23 1523  BP: 114/63  Pulse: 63  Resp: 18  Temp: 97.8 F (36.6 C)  SpO2: 97%    Filed Weights   10/02/23 1523  Weight: 193 lb 14.4 oz (  88 kg)    Physical Exam HENT:     Head: Normocephalic and atraumatic.     Mouth/Throat:     Pharynx: No oropharyngeal exudate.  Eyes:     Pupils: Pupils are equal, round, and reactive to light.  Cardiovascular:     Rate and Rhythm: Normal rate and regular rhythm.  Pulmonary:     Effort: No respiratory distress.     Breath sounds: No  wheezing.  Abdominal:     General: Bowel sounds are normal. There is no distension.     Palpations: Abdomen is soft. There is no mass.     Tenderness: There is no abdominal tenderness. There is no guarding or rebound.  Musculoskeletal:        General: No tenderness. Normal range of motion.     Cervical back: Normal range of motion and neck supple.  Skin:    General: Skin is warm.  Neurological:     Mental Status: He is alert and oriented to person, place, and time.  Psychiatric:        Mood and Affect: Affect normal.      LABORATORY DATA:  I have reviewed the data as listed Lab Results  Component Value Date   WBC 2.0 (LL) 03/11/2023   HGB 13.1 03/11/2023   HCT 39.0 03/11/2023   MCV 100 (H) 03/11/2023   PLT 52 (LL) 03/11/2023   Recent Labs    03/11/23 0822 09/17/23 0843  NA 144 142  K 4.9 4.2  CL 107* 110*  CO2  --  21  GLUCOSE 91 100*  BUN 12 10  CREATININE 1.05 1.12  CALCIUM 9.4 8.7  PROT 6.5 6.2  ALBUMIN  3.9 3.7*  AST 35 34  ALT 23 21  ALKPHOS 103 90  BILITOT 1.4* 1.9*     RADIOGRAPHIC STUDIES: I have personally reviewed the radiological images as listed and agreed with the findings in the report. No results found.  ASSESSMENT & PLAN:   Thrombocytopenia (HCC) #Leucopenia/ Thrombocytopenia/splenomegaly-Hypersplenism- [January 2019 platelets- between 106-97]-  # 2024-WBC- 2.2/ANC1.6 Hb -11.2 platelets 52 Trending down but overall clinically stable. Asymptomatic.  Continue surveillance. AFP- 6.3-   # Cirrhosis/splenomegaly [May 2020]  [Dr.Anna] [n-8.3]; intermittently elevated; monitor for now; EGD- SEP 2023- s/p banding. MRI AUG 2023- No enhancing lesion in the liver. No evidence of hepatocellular carcinoma. Portal hypertension with paraesophageal varices and splenomegaly. No ascites-. US  in DEC  2024 [Dr.Anna]- No concerns of HCC.    # DISPOSITION:  # follow up in in 6 months MD, labs -cbc/cmp/AFP/PT/PTT; iron studies;ferritin-  [[facility]]; -  Dr.B   All questions were answered. The patient knows to call the clinic with any problems, questions or concerns.    Gwyn Leos, MD 10/02/2023 4:12 PM

## 2023-10-02 NOTE — Progress Notes (Signed)
 Pt feels like his hernia is back in R upper abd. He is wearing the hernia belt. Has a good appetite. Denies nausea. Energy feels fair. Does exercise. No blood in stool. Trying to keep hydrated.

## 2023-10-02 NOTE — Assessment & Plan Note (Addendum)
#  Leucopenia/ Thrombocytopenia/splenomegaly-Hypersplenism- [January 2019 platelets- between 106-97]-  # 2024-WBC- 2.2/ANC1.6 Hb -11.2 platelets 52 Trending down but overall clinically stable. Asymptomatic.  Continue surveillance. AFP- 6.3-   # Cirrhosis/splenomegaly [May 2020]  [Dr.Anna] [n-8.3]; intermittently elevated; monitor for now; EGD- SEP 2023- s/p banding. MRI AUG 2023- No enhancing lesion in the liver. No evidence of hepatocellular carcinoma. Portal hypertension with paraesophageal varices and splenomegaly. No ascites-. US  in DEC  2024 [Dr.Anna]- No concerns of HCC.    # DISPOSITION:  # follow up in in 6 months MD, labs -cbc/cmp/AFP/PT/PTT; iron studies;ferritin-  [[facility]]; - Dr.B

## 2023-11-11 ENCOUNTER — Other Ambulatory Visit: Payer: Self-pay

## 2023-11-11 MED ORDER — NADOLOL 20 MG PO TABS: ORAL_TABLET | ORAL | 1 refills | Status: AC

## 2023-11-11 MED ORDER — LACTULOSE 10 GM/15ML PO SOLN: 10.0000 g | Freq: Three times a day (TID) | ORAL | 3 refills | Status: AC | PRN

## 2023-12-14 ENCOUNTER — Encounter: Payer: Self-pay | Admitting: Physician Assistant

## 2023-12-14 ENCOUNTER — Ambulatory Visit: Payer: Self-pay | Admitting: Physician Assistant

## 2023-12-14 DIAGNOSIS — H833X3 Noise effects on inner ear, bilateral: Secondary | ICD-10-CM

## 2023-12-14 MED ORDER — PSEUDOEPHEDRINE HCL ER 120 MG PO TB12: 120.0000 mg | ORAL_TABLET | Freq: Two times a day (BID) | ORAL | 0 refills | Status: AC

## 2023-12-14 NOTE — Progress Notes (Signed)
 Pt presents today with hearing loss in both ears. Right ear 3months & left ear 2 months both due to being around shooting fire arms. Ryan Willis

## 2023-12-14 NOTE — Progress Notes (Signed)
   Subjective: Hearing loss    Patient ID: Ryan Willis., male    DOB: 06-24-1960, 63 y.o.   MRN: [id number]  HPI Patient complain of bilateral hearing loss for greater than 3 months.  Patient states right hearing  loss is greater than left hearing.  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 States positional changes increases hearing and right ear.  Denies vertigo.  Review of Systems Cirrhosis, cholecystitis, thrombocytopenia, esophageal varices and splenectomy.    Objective:   Physical Exam HEENT remarkable. Today's audiogram shows moderate hearing loss in right ear and mild hearing loss in the left ear. Neck is supple for lymphadenopathy or bruits. Lungs are clear to auscultation. Heart regular rate and rhythm.      Assessment & Plan: Moderate hearing loss   Patient given a prescription of Sudafed 120 mg twice daily.  Patient will follow-up with the clinic for definitive evaluation.

## 2023-12-14 NOTE — Addendum Note (Signed)
 Addended by: ELUTERIO JENKINS HERO on: 12/14/2023 01:47 PM   Modules accepted: Orders

## 2024-04-04 DIAGNOSIS — K7460 Unspecified cirrhosis of liver: Secondary | ICD-10-CM | POA: Diagnosis not present

## 2024-04-04 DIAGNOSIS — D696 Thrombocytopenia, unspecified: Secondary | ICD-10-CM | POA: Diagnosis not present

## 2024-04-05 ENCOUNTER — Encounter: Payer: Self-pay | Admitting: Internal Medicine

## 2024-04-08 ENCOUNTER — Ambulatory Visit: Payer: Self-pay

## 2024-04-08 DIAGNOSIS — Z0000 Encounter for general adult medical examination without abnormal findings: Secondary | ICD-10-CM

## 2024-04-08 LAB — POCT URINALYSIS DIPSTICK
Bilirubin, UA: NEGATIVE
Glucose, UA: NEGATIVE
Ketones, UA: NEGATIVE
Leukocytes, UA: NEGATIVE
Nitrite, UA: NEGATIVE
Protein, UA: NEGATIVE
Spec Grav, UA: 1.015 (ref 1.010–1.025)
Urobilinogen, UA: 2 U/dL — AB
pH, UA: 6 (ref 5.0–8.0)

## 2024-04-09 LAB — PSA: Prostate Specific Ag, Serum: 1.5 ng/mL (ref 0.0–4.0)

## 2024-04-09 LAB — LIPID PANEL
Chol/HDL Ratio: 2.4 ratio (ref 0.0–5.0)
Cholesterol, Total: 109 mg/dL (ref 100–199)
HDL: 46 mg/dL (ref >39)
LDL Chol Calc (NIH): 47 mg/dL (ref 0–99)
Triglycerides: 77 mg/dL (ref 0–149)
VLDL Cholesterol Cal: 16 mg/dL (ref 5–40)

## 2024-04-12 ENCOUNTER — Encounter: Payer: Self-pay | Admitting: Internal Medicine

## 2024-04-12 ENCOUNTER — Inpatient Hospital Stay: Attending: Internal Medicine | Admitting: Internal Medicine

## 2024-04-12 VITALS — BP 127/58 | HR 57 | Temp 96.3°F | Resp 18 | Ht 66.0 in | Wt 191.5 lb

## 2024-04-12 DIAGNOSIS — D696 Thrombocytopenia, unspecified: Secondary | ICD-10-CM | POA: Diagnosis not present

## 2024-04-12 DIAGNOSIS — D649 Anemia, unspecified: Secondary | ICD-10-CM | POA: Insufficient documentation

## 2024-04-12 DIAGNOSIS — K7460 Unspecified cirrhosis of liver: Secondary | ICD-10-CM | POA: Insufficient documentation

## 2024-04-12 NOTE — Assessment & Plan Note (Addendum)
#  Leucopenia/ Thrombocytopenia/splenomegaly-Hypersplenism- [January 2019 platelets- between 106-97]-  # DEC 2025- WBC- 1.8 /ANC1.6 Hb -11.2 platelets 42 Trending down but overall clinically stable. Asymptomatic.  Continue surveillance. AFP- 6.3-   #Recommend gentle iron [iron biglycinate; 28 mg ] 1 pill a day.  This pill is unlikely to cause stomach upset or cause constipation.  Available Over the counter or talk to pharmacist.   # Cirrhosis/splenomegaly [May 2020]  [Dr.Anna] [n-8.3]; intermittently elevated; monitor for now; EGD- SEP 2023- s/p banding. MRI AUG 2023- No enhancing lesion in the liver. No evidence of hepatocellular carcinoma. Portal hypertension with paraesophageal varices and splenomegaly. No ascites-. US  in DEC  2024 [Dr.Anna]- No concerns of HCC.  Awaiting repeat follow up with GI.   # DISPOSITION:  # follow up in in 6 months MD, labs -cbc/cmp/AFP/PT/PTT; iron studies;ferritin-  [[facility]]; possible Venofer- ; - Dr.B

## 2024-04-12 NOTE — Progress Notes (Signed)
 Easy bruising: YES WHEN NOT TAKING THE CREATINE Petechiae (bleeding under skin): NO Gingival bleeding (gums): NO  Epistaxis (nose bleeds): NO  Hematochezia (blood in stools):NO Hematuria (blood in urine): NO  Can pt take sudafed with his cirrhosis of the liver?

## 2024-04-12 NOTE — Progress Notes (Signed)
 [facility] CONSULT NOTE  Patient Care Team: Patient, No Pcp Per as PCP - General (General Practice) Rennie Ryan SAUNDERS, MD as Consulting Physician (Oncology)  CHIEF COMPLAINTS/PURPOSE OF CONSULTATION: Thrombocytopenia  # THROMBOCYTOPENIA [97-108; incidental]; Hep B/hepC/HIV-NEG [[facility]] sec to cirrhosis/ splenomegaly  # Cirrhosis/ SPLENOMEGALY[US - 1000cc; CT A/P march 2019] s/p EGD/colo [may 2019- Dr.Anna]  #October 2020-kidney stones s/p stenting [Dr. Tharon; gallstone/cholecystitis-s/p cholecystectomy December 2020]  # Peyronie disease-   Oncology History   No problem history exists.   HISTORY OF PRESENTING ILLNESS: Ambulating independently.  Alone.  Ryan Willis 63 y.o.  male cirrhosis/splenomegaly and thrombocytopenia is here for follow-up/ review of labs [[facility]].  Discussed the use of AI scribe software for clinical note transcription with the patient, who gave verbal consent to proceed.  History of Present Illness   Ryan Befort. is a 63 year old male with cirrhosis complicated by thrombocytopenia, leukopenia, and iron deficiency anemia who presents for hematology/oncology follow-up to monitor cytopenias and liver-related complications.  Cirrhosis symptoms are stable without significant interval change. He reports possible mild nocturnal lower extremity edema, which he does not consider abnormal, and denies abdominal swelling or distention. No new or concerning symptoms related to liver disease are reported.  He continues to have thrombocytopenia and leukopenia, with recent laboratory values showing a white blood cell count of 1.8 and platelet count of 42,000, both decreased from prior measurements. Hemoglobin is mildly decreased at 11.8. He denies infections, fevers, or chills. No recent or planned surgeries. He has not received an influenza vaccination this season and generally does not receive annual influenza vaccination.  He discontinued  creatine supplementation after developing leg cramps, which resolved upon cessation. He has never taken oral iron supplementation but has previously received iron infusions.  He experiences intermittent right ear congestion, attributed to noise exposure, which sometimes improves with positional changes but recurs. He has discussed this with his primary care provider, who prescribed pseudoephedrine , though he has not yet initiated this therapy.     Review of Systems  Constitutional:  Positive for weight loss. Negative for chills, diaphoresis, fever and malaise/fatigue.  HENT:  Negative for nosebleeds and sore throat.   Eyes:  Negative for double vision.  Respiratory:  Negative for cough, hemoptysis, sputum production, shortness of breath and wheezing.   Cardiovascular:  Negative for chest pain, palpitations, orthopnea and leg swelling.  Gastrointestinal:  Negative for abdominal pain, blood in stool, constipation, diarrhea, heartburn, melena, nausea and vomiting.  Genitourinary:  Negative for dysuria, frequency and urgency.  Musculoskeletal:  Negative for back pain and joint pain.  Skin: Negative.  Negative for itching and rash.  Neurological:  Negative for dizziness, tingling, focal weakness, weakness and headaches.  Endo/Heme/Allergies:  Bruises/bleeds easily.  Psychiatric/Behavioral:  Negative for depression. The patient is not nervous/anxious and does not have insomnia.     MEDICAL HISTORY:  Past Medical History:  Diagnosis Date   Cirrhosis of liver (HCC)    Diverticulitis    Esophageal varices (HCC)    Family history of adverse reaction to anesthesia    MOTHER HAD N/V   History of kidney stones    Hypercholesteremia    Hypothyroidism    Peyronie's disease    Renal cyst, left    Seasonal allergies    Thrombocytopenia     SURGICAL HISTORY: Past Surgical History:  Procedure Laterality Date   CHOLECYSTECTOMY     COLONOSCOPY  2006   COLONOSCOPY WITH PROPOFOL  N/A 09/08/2017    Procedure: COLONOSCOPY  WITH PROPOFOL ;  Surgeon: Therisa Bi, MD;  Location: [facility] ENDOSCOPY;  Service: Gastroenterology;  Laterality: N/A;   COLONOSCOPY WITH PROPOFOL  N/A 01/01/2018   Procedure: COLONOSCOPY WITH PROPOFOL ;  Surgeon: Therisa Bi, MD;  Location: [facility] ENDOSCOPY;  Service: Gastroenterology;  Laterality: N/A;   CYSTOSCOPY/URETEROSCOPY/HOLMIUM LASER/STENT PLACEMENT Right 01/17/2019   Procedure: CYSTOSCOPY/URETEROSCOPY/STENT PLACEMENT;  Surgeon: Penne Knee, MD;  Location: [facility] ORS;  Service: Urology;  Laterality: Right;   CYSTOSCOPY/URETEROSCOPY/HOLMIUM LASER/STENT PLACEMENT Right 01/31/2019   Procedure: CYSTOSCOPY/URETEROSCOPY/HOLMIUM LASER/STENT EXCHANGE;  Surgeon: Penne Knee, MD;  Location: [facility] ORS;  Service: Urology;  Laterality: Right;   CYSTOSCOPY/URETEROSCOPY/HOLMIUM LASER/STENT PLACEMENT Left 08/13/2020   Procedure: CYSTOSCOPY/URETEROSCOPY/HOLMIUM LASER/STENT PLACEMENT;  Surgeon: Penne Knee, MD;  Location: [facility] ORS;  Service: Urology;  Laterality: Left;   ESOPHAGOGASTRODUODENOSCOPY (EGD) WITH PROPOFOL  N/A 09/08/2017   Procedure: ESOPHAGOGASTRODUODENOSCOPY (EGD) WITH PROPOFOL ;  Surgeon: Therisa Bi, MD;  Location: [facility] ENDOSCOPY;  Service: Gastroenterology;  Laterality: N/A;   ESOPHAGOGASTRODUODENOSCOPY (EGD) WITH PROPOFOL  N/A 05/31/2020   Procedure: ESOPHAGOGASTRODUODENOSCOPY (EGD) WITH PROPOFOL ;  Surgeon: Therisa Bi, MD;  Location: [facility] ENDOSCOPY;  Service: Gastroenterology;  Laterality: N/A;   ESOPHAGOGASTRODUODENOSCOPY (EGD) WITH PROPOFOL  N/A 07/05/2021   Procedure: ESOPHAGOGASTRODUODENOSCOPY (EGD) WITH PROPOFOL ;  Surgeon: Therisa Bi, MD;  Location: [facility] ENDOSCOPY;  Service: Gastroenterology;  Laterality: N/A;  WIFE WILL BE DRIVER: WILL BE 20 - 30 MINUTES AWAY   ESOPHAGOGASTRODUODENOSCOPY (EGD) WITH PROPOFOL  N/A 08/08/2021   Procedure: ESOPHAGOGASTRODUODENOSCOPY (EGD) WITH PROPOFOL ;  Surgeon: Therisa Bi, MD;  Location: [facility] ENDOSCOPY;  Service: Gastroenterology;   Laterality: N/A;   ESOPHAGOGASTRODUODENOSCOPY (EGD) WITH PROPOFOL  N/A 01/02/2022   Procedure: ESOPHAGOGASTRODUODENOSCOPY (EGD) WITH PROPOFOL ;  Surgeon: Therisa Bi, MD;  Location: [facility] ENDOSCOPY;  Service: Gastroenterology;  Laterality: N/A;   ESOPHAGOGASTRODUODENOSCOPY (EGD) WITH PROPOFOL  N/A 10/10/2022   Procedure: ESOPHAGOGASTRODUODENOSCOPY (EGD) WITH PROPOFOL ;  Surgeon: Therisa Bi, MD;  Location: [facility] ENDOSCOPY;  Service: Gastroenterology;  Laterality: N/A;   HERNIA REPAIR Right 1981   inguinal   INTRAOPERATIVE CHOLANGIOGRAM N/A 04/19/2019   Procedure: INTRAOPERATIVE CHOLANGIOGRAM;  Surgeon: Jordis Laneta FALCON, MD;  Location: [facility] ORS;  Service: General;  Laterality: N/A;   SHOULDER SURGERY Right 2001   ROTATOR CUFF REPAIR   VENTRAL HERNIA REPAIR N/A 10/27/2019   Procedure: HERNIA REPAIR VENTRAL ADULT, Open;  Surgeon: Jordis Laneta FALCON, MD;  Location: [facility] ORS;  Service: General;  Laterality: N/A;    SOCIAL HISTORY:  Social History   Socioeconomic History   Marital status: Married    Spouse name: Not on file   Number of children: Not on file   Years of education: Not on file   Highest education level: Not on file  Occupational History   Not on file  Tobacco Use   Smoking status: Never   Smokeless tobacco: Never  Vaping Use   Vaping status: Never Used  Substance and Sexual Activity   Alcohol use: Not Currently   Drug use: No   Sexual activity: Not on file  Other Topics Concern   Not on file  Social History Narrative   retd. Emergency planning/management officer; part time information specialist; never smoked; no alcohol; live in [place]. Lives with wife.    Social Drivers of Health   Tobacco Use: Low Risk (04/12/2024)   Patient History    Smoking Tobacco Use: Never    Smokeless Tobacco Use: Never    Passive Exposure: Not on file  Financial Resource Strain: Not on file  Food Insecurity: Not on file  Transportation Needs: Not on file  Physical Activity: Not on file  Stress:  Not on file  Social  Connections: Not on file  Intimate Partner Violence: Not on file  Depression [phone]): Low Risk (04/12/2024)   Depression (PHQ2-9)    PHQ-2 Score: 0  Alcohol Screen: Not on file  Housing: Not on file  Utilities: Not on file  Health Literacy: Not on file    FAMILY HISTORY: colon father- 28 year; mother- kidney cancer; died of lung cancer [2018] Family History  Problem Relation Age of Onset   Kidney cancer Mother    Lung cancer Mother    Colon cancer Father     ALLERGIES:  has no known allergies.  MEDICATIONS:  Current Outpatient Medications  Medication Sig Dispense Refill   lactulose  (CHRONULAC ) 10 GM/15ML solution Take 15 mLs (10 g total) by mouth 3 (three) times daily as needed for mild constipation. 236 mL 3   Multiple Vitamin (MULTIVITAMIN WITH MINERALS) TABS tablet Take 1 tablet by mouth daily.     nadolol  (CORGARD ) 20 MG tablet TAKE 1 TABLET(20 MG) BY MOUTH DAILY 90 tablet 1   Creatine POWD Take 5 g by mouth daily. (Patient not taking: Reported on 04/12/2024)     pseudoephedrine  (SUDAFED) 120 MG 12 hr tablet Take 1 tablet (120 mg total) by mouth 2 (two) times daily. (Patient not taking: Reported on 04/12/2024) 20 tablet 0   No current facility-administered medications for this visit.   PHYSICAL EXAMINATION: ECOG PERFORMANCE STATUS: 0 - Asymptomatic  Vitals:   04/12/24 1505  BP: (!) 127/58  Pulse: (!) 57  Resp: 18  Temp: (!) 96.3 F (35.7 C)  SpO2: 99%    Filed Weights   04/12/24 1505  Weight: 191 lb 8 oz (86.9 kg)    Physical Exam HENT:     Head: Normocephalic and atraumatic.     Mouth/Throat:     Pharynx: No oropharyngeal exudate.  Eyes:     Pupils: Pupils are equal, round, and reactive to light.  Cardiovascular:     Rate and Rhythm: Normal rate and regular rhythm.  Pulmonary:     Effort: No respiratory distress.     Breath sounds: No wheezing.  Abdominal:     General: Bowel sounds are normal. There is no distension.     Palpations: Abdomen is  soft. There is no mass.     Tenderness: There is no abdominal tenderness. There is no guarding or rebound.  Musculoskeletal:        General: No tenderness. Normal range of motion.     Cervical back: Normal range of motion and neck supple.  Skin:    General: Skin is warm.  Neurological:     Mental Status: He is alert and oriented to person, place, and time.  Psychiatric:        Mood and Affect: Affect normal.      LABORATORY DATA:  I have reviewed the data as listed Lab Results  Component Value Date   WBC 2.0 (LL) 03/11/2023   HGB 13.1 03/11/2023   HCT 39.0 03/11/2023   MCV 100 (H) 03/11/2023   PLT 52 (LL) 03/11/2023   Recent Labs    09/17/23 0843  NA 142  K 4.2  CL 110*  CO2 21  GLUCOSE 100*  BUN 10  CREATININE 1.12  CALCIUM 8.7  PROT 6.2  ALBUMIN  3.7*  AST 34  ALT 21  ALKPHOS 90  BILITOT 1.9*     RADIOGRAPHIC STUDIES: I have personally reviewed the radiological images as listed and agreed with the findings in the  report. No results found.  ASSESSMENT & PLAN:   Thrombocytopenia #Leucopenia/ Thrombocytopenia/splenomegaly-Hypersplenism- [January 2019 platelets- between 106-97]-  # DEC 2025- WBC- 1.8 /ANC1.6 Hb -11.2 platelets 42 Trending down but overall clinically stable. Asymptomatic.  Continue surveillance. AFP- 6.3-   #Recommend gentle iron [iron biglycinate; 28 mg ] 1 pill a day.  This pill is unlikely to cause stomach upset or cause constipation.  Available Over the counter or talk to pharmacist.   # Cirrhosis/splenomegaly [May 2020]  [Dr.Anna] [n-8.3]; intermittently elevated; monitor for now; EGD- SEP 2023- s/p banding. MRI AUG 2023- No enhancing lesion in the liver. No evidence of hepatocellular carcinoma. Portal hypertension with paraesophageal varices and splenomegaly. No ascites-. US  in DEC  2024 [Dr.Anna]- No concerns of HCC.  Awaiting repeat follow up with GI.   # DISPOSITION:  # follow up in in 6 months MD, labs -cbc/cmp/AFP/PT/PTT; iron  studies;ferritin-  [[facility]]; possible Venofer- ; - Dr.B   All questions were answered. The patient knows to call the clinic with any problems, questions or concerns.    Ryan JONELLE Joe, MD 04/12/2024 3:40 PM

## 2024-04-12 NOTE — Patient Instructions (Signed)
#  Recommend gentle iron  [iron  biglycinate; 28 mg ] 1 pill a day.  This pill is unlikely to cause stomach upset or cause constipation.  Available Over the counter or talk to pharmacist.

## 2024-04-13 ENCOUNTER — Encounter: Payer: Self-pay | Admitting: Physician Assistant

## 2024-04-13 ENCOUNTER — Ambulatory Visit: Payer: Self-pay | Admitting: Physician Assistant

## 2024-04-13 VITALS — BP 129/61 | HR 66 | Temp 97.6°F | Resp 16 | Ht 66.0 in | Wt 190.0 lb

## 2024-04-13 DIAGNOSIS — Z0000 Encounter for general adult medical examination without abnormal findings: Secondary | ICD-10-CM

## 2024-04-13 NOTE — Progress Notes (Signed)
 Pt presents today to complete physical, Pt didn't voice any concerns at this time Ryan Willis

## 2024-04-13 NOTE — Progress Notes (Signed)
 [facility] [place] [facility]  ____________________________________________   None    (approximate)  I have reviewed the triage vital signs and the nursing notes.   HISTORY  Chief Complaint Annual Exam   HPI Ryan Willis. is a 63 y.o. male patient presents for annual physical exam.  Voices no concerns or complaints.         Past Medical History:  Diagnosis Date   Cirrhosis of liver (HCC)    Diverticulitis    Esophageal varices (HCC)    Family history of adverse reaction to anesthesia    MOTHER HAD N/V   History of kidney stones    Hypercholesteremia    Hypothyroidism    Peyronie's disease    Renal cyst, left    Seasonal allergies    Thrombocytopenia     Patient Active Problem List   Diagnosis Date Noted   Symptomatic anemia 04/12/2024   Esophageal varices determined by endoscopy (HCC)    S/P cholecystectomy 04/26/2019   Cholecystitis 04/19/2019   Acute cholecystitis 01/25/2019   Splenomegaly 07/07/2017   Thrombocytopenia 06/23/2017   Cirrhosis (HCC) 06/23/2017    Past Surgical History:  Procedure Laterality Date   CHOLECYSTECTOMY     COLONOSCOPY  2006   COLONOSCOPY WITH PROPOFOL  N/A 09/08/2017   Procedure: COLONOSCOPY WITH PROPOFOL ;  Surgeon: Therisa Bi, MD;  Location: [facility] ENDOSCOPY;  Service: Gastroenterology;  Laterality: N/A;   COLONOSCOPY WITH PROPOFOL  N/A 01/01/2018   Procedure: COLONOSCOPY WITH PROPOFOL ;  Surgeon: Therisa Bi, MD;  Location: [facility] ENDOSCOPY;  Service: Gastroenterology;  Laterality: N/A;   CYSTOSCOPY/URETEROSCOPY/HOLMIUM LASER/STENT PLACEMENT Right 01/17/2019   Procedure: CYSTOSCOPY/URETEROSCOPY/STENT PLACEMENT;  Surgeon: Penne Knee, MD;  Location: [facility] ORS;  Service: Urology;  Laterality: Right;   CYSTOSCOPY/URETEROSCOPY/HOLMIUM LASER/STENT PLACEMENT Right 01/31/2019   Procedure: CYSTOSCOPY/URETEROSCOPY/HOLMIUM LASER/STENT EXCHANGE;  Surgeon: Penne Knee, MD;  Location: [facility] ORS;  Service: Urology;   Laterality: Right;   CYSTOSCOPY/URETEROSCOPY/HOLMIUM LASER/STENT PLACEMENT Left 08/13/2020   Procedure: CYSTOSCOPY/URETEROSCOPY/HOLMIUM LASER/STENT PLACEMENT;  Surgeon: Penne Knee, MD;  Location: [facility] ORS;  Service: Urology;  Laterality: Left;   ESOPHAGOGASTRODUODENOSCOPY (EGD) WITH PROPOFOL  N/A 09/08/2017   Procedure: ESOPHAGOGASTRODUODENOSCOPY (EGD) WITH PROPOFOL ;  Surgeon: Therisa Bi, MD;  Location: [facility] ENDOSCOPY;  Service: Gastroenterology;  Laterality: N/A;   ESOPHAGOGASTRODUODENOSCOPY (EGD) WITH PROPOFOL  N/A 05/31/2020   Procedure: ESOPHAGOGASTRODUODENOSCOPY (EGD) WITH PROPOFOL ;  Surgeon: Therisa Bi, MD;  Location: [facility] ENDOSCOPY;  Service: Gastroenterology;  Laterality: N/A;   ESOPHAGOGASTRODUODENOSCOPY (EGD) WITH PROPOFOL  N/A 07/05/2021   Procedure: ESOPHAGOGASTRODUODENOSCOPY (EGD) WITH PROPOFOL ;  Surgeon: Therisa Bi, MD;  Location: [facility] ENDOSCOPY;  Service: Gastroenterology;  Laterality: N/A;  WIFE WILL BE DRIVER: WILL BE 20 - 30 MINUTES AWAY   ESOPHAGOGASTRODUODENOSCOPY (EGD) WITH PROPOFOL  N/A 08/08/2021   Procedure: ESOPHAGOGASTRODUODENOSCOPY (EGD) WITH PROPOFOL ;  Surgeon: Therisa Bi, MD;  Location: [facility] ENDOSCOPY;  Service: Gastroenterology;  Laterality: N/A;   ESOPHAGOGASTRODUODENOSCOPY (EGD) WITH PROPOFOL  N/A 01/02/2022   Procedure: ESOPHAGOGASTRODUODENOSCOPY (EGD) WITH PROPOFOL ;  Surgeon: Therisa Bi, MD;  Location: [facility] ENDOSCOPY;  Service: Gastroenterology;  Laterality: N/A;   ESOPHAGOGASTRODUODENOSCOPY (EGD) WITH PROPOFOL  N/A 10/10/2022   Procedure: ESOPHAGOGASTRODUODENOSCOPY (EGD) WITH PROPOFOL ;  Surgeon: Therisa Bi, MD;  Location: [facility] ENDOSCOPY;  Service: Gastroenterology;  Laterality: N/A;   HERNIA REPAIR Right 1981   inguinal   INTRAOPERATIVE CHOLANGIOGRAM N/A 04/19/2019   Procedure: INTRAOPERATIVE CHOLANGIOGRAM;  Surgeon: Jordis Laneta FALCON, MD;  Location: [facility] ORS;  Service: General;  Laterality: N/A;   SHOULDER SURGERY Right 2001   ROTATOR CUFF REPAIR   VENTRAL HERNIA  REPAIR N/A  10/27/2019   Procedure: HERNIA REPAIR VENTRAL ADULT, Open;  Surgeon: Jordis Laneta FALCON, MD;  Location: [facility] ORS;  Service: General;  Laterality: N/A;    Prior to Admission medications  Medication Sig Start Date End Date Taking? Authorizing Provider  Creatine POWD Take 5 g by mouth daily. Patient not taking: Reported on 04/12/2024    [provider]  lactulose  (CHRONULAC ) 10 GM/15ML solution Take 15 mLs (10 g total) by mouth 3 (three) times daily as needed for mild constipation. 11/11/23   Jinny Carmine, MD  Multiple Vitamin (MULTIVITAMIN WITH MINERALS) TABS tablet Take 1 tablet by mouth daily.    [provider]  nadolol  (CORGARD ) 20 MG tablet TAKE 1 TABLET(20 MG) BY MOUTH DAILY 11/11/23   Jinny Carmine, MD  pseudoephedrine  (SUDAFED) 120 MG 12 hr tablet Take 1 tablet (120 mg total) by mouth 2 (two) times daily. Patient not taking: Reported on 04/12/2024 12/14/23   Claudene Tanda POUR, PA-C    Allergies Patient has no known allergies.  Family History  Problem Relation Age of Onset   Kidney cancer Mother    Lung cancer Mother    Colon cancer Father     Social History Social History[1]  Review of Systems Constitutional: No fever/chills Eyes: No visual changes. ENT: No sore throat. Cardiovascular: Denies chest pain. Respiratory: Denies shortness of breath. Gastrointestinal: No abdominal pain.  No nausea, no vomiting.  No diarrhea.  No constipation. Genitourinary: Negative for dysuria. Musculoskeletal: Negative for back pain. Skin: Negative for rash. Neurological: Negative for headaches, focal weakness or numbness.  Endocrine: Liver cirrhosis Hematological/Lymphatic: Thrombocytopenia and anemia   ____________________________________________   PHYSICAL EXAM:  VITAL SIGNS: BP 129/61  Cuff Size Normal  Pulse Rate 66  Temp 97.6 F (36.4 C)  Temp Source Temporal  Weight 190 lb (86.2 kg)  Height 5' 6 (1.676 m)  Resp 16  SpO2 97 %   BMI: 30.67 kg/m2   BSA: 2.00 m2   Constitutional: Alert and oriented. Well appearing and in no acute distress. Eyes: Conjunctivae are normal. PERRL. EOMI. Head: Atraumatic. Nose: No congestion/rhinnorhea. Mouth/Throat: Mucous membranes are moist.  Oropharynx non-erythematous. Neck: No stridor.  No cervical spine tenderness to palpation. Hematological/Lymphatic/Immunilogical: No cervical lymphadenopathy. Cardiovascular: Normal rate, regular rhythm. Grossly normal heart sounds.  Good peripheral circulation. Respiratory: Normal respiratory effort.  No retractions. Lungs CTAB. Gastrointestinal: Soft and nontender. No distention. No abdominal bruits. No CVA tenderness. Genitourinary: Deferred Musculoskeletal: No lower extremity tenderness nor edema.  No joint effusions. Neurologic:  Normal speech and language. No gross focal neurologic deficits are appreciated. No gait instability. Skin:  Skin is warm, dry and intact. No rash noted. Psychiatric: Mood and affect are normal. Speech and behavior are normal.  ____________________________________________   LABS           Component Ref Range & Units (hover) 5 d ago (04/08/24) 1 yr ago (03/18/23) 1 yr ago (05/30/22) 3 yr ago (09/05/20) 3 yr ago (08/24/20) 3 yr ago (07/23/20) 3 yr ago (07/14/20)  Color, UA amber amber amber Yellow R Amber CM Orange R   Clarity, UA clear cloudy clear  Clear    Glucose, UA Negative Negative Negative  Negative    Bilirubin, UA neg neg neg Negative R Negative Negative R   Ketones, UA neg neg neg Negative R Negative Negative R   Spec Grav, UA 1.015 1.020 1.025 1.010 R 1.020 1.025 R   Blood, UA trace -+ Abnormal  neg neg 3+ Abnormal  R Positive CM 3+  Abnormal  R   pH, UA 6.0 6.0 6.0 7.0 R 6.0 5.5 R   Protein, UA Negative Positive Abnormal  CM Negative Negative R Positive Abnormal  CM Negative R   Urobilinogen, UA 2.0 Abnormal  2.0 Abnormal  0.2  1.0    Nitrite, UA neg neg neg Negative R Negative Negative R   Leukocytes, UA Negative  Trace Abnormal  CM Negative Trace Abnormal  Small (1+) Abnormal  Negative   Appearance   dark    CLOUDY Abnormal  R  Odor         Resulting Agency    [facility]             Component Ref Range & Units (hover) 5 d ago (04/08/24) 1 yr ago (03/11/23) 1 yr ago (05/30/22) 3 yr ago (08/24/20) 4 yr ago (06/09/19)  Prostate Specific Ag, Serum 1.5 1.5 CM 1.5 CM 1.2 CM 1.5 CM  Comment: Roche ECLIA methodology. According to the American Urological Association, Serum PSA should decrease and remain at undetectable levels after radical prostatectomy. The AUA defines biochemical recurrence as an initial PSA value 0.2 ng/mL or greater followed by a subsequent confirmatory PSA value 0.2 ng/mL or greater. Values obtained with different assay methods or kits cannot be used interchangeably. Results cannot be interpreted as absolute evidence of the presence or absence of malignant disease.                     Component Ref Range & Units (hover) 5 d ago (04/08/24) 1 yr ago (03/11/23) 1 yr ago (05/30/22) 3 yr ago (08/24/20) 4 yr ago (06/09/19) 6 yr ago (02/17/18)  Cholesterol, Total 109 111 117 127 120 133  Triglycerides 77 69 73 83 70 92  HDL 46 47 45 45 51 44  VLDL Cholesterol Cal 16 15 15 16 15 18   LDL Chol Calc (NIH) 47 49 57 66 54   Chol/HDL Ratio 2.4 2.4 CM 2.6 CM 2.8 CM 2.4 CM   Comment:                                   T. Chol/HDL Ratio                                             Men  Women                               1/2 Avg.Risk  3.4    3.3                                   Avg.Risk  5.0    4.4                                2X Avg.Risk  9.6    7.1                                3X Avg.Risk 23.4   11.0  ____________________________________________  EKG  Bradycardic at 53 bpm ____________________________________________    ____________________________________________   INITIAL IMPRESSION / ASSESSMENT AND PLAN  As part of my medical  decision making, I reviewed the following data within the electronic MEDICAL RECORD NUMBER      No acute findings on physical exam, EKG, labs.        ____________________________________________   FINAL CLINICAL IMPRESSION  Well exam   ED Discharge Orders     None        Note:  This document was prepared using Dragon voice recognition software and may include unintentional dictation errors.     [1]  Social History Tobacco Use   Smoking status: Never   Smokeless tobacco: Never  Vaping Use   Vaping status: Never Used  Substance Use Topics   Alcohol use: Not Currently   Drug use: No

## 2024-04-15 ENCOUNTER — Telehealth: Payer: Self-pay | Admitting: *Deleted

## 2024-04-15 ENCOUNTER — Ambulatory Visit: Admitting: Internal Medicine

## 2024-04-15 NOTE — Telephone Encounter (Signed)
 Patient called and stated that he did not get a prescription sent for his iron tablets sent the pharmacy. I explained to him that this is over the counter. I reviewed Dr. Damaris note. Pt informed that Dr. KATHEE recommended gentle iron [iron biglycinate; 28 mg ] 1 pill a day. Explained to him that this pill is unlikely to cause stomach upset or cause constipation. He does not need a prescription for this. However, if he gets the pharmacy and can get it cheaper with the rx for gentle iron, we are happy to send the rx over to the pharmacy. Pt stated that he understand and he would just go by the supplement section of the pharmacy and pick the iron up.

## 2024-10-10 ENCOUNTER — Inpatient Hospital Stay

## 2024-10-10 ENCOUNTER — Inpatient Hospital Stay: Admitting: Internal Medicine
